# Patient Record
Sex: Female | Born: 1935
Health system: Southern US, Community
[De-identification: ages and names within clinical notes are randomized; demographics above are authoritative.]

## PROBLEM LIST (undated history)

## (undated) ENCOUNTER — Emergency Department (HOSPITAL_COMMUNITY): Disposition: A | Discharge status: Home / Self Care | Payer: PRIVATE HEALTH INSURANCE

## (undated) DIAGNOSIS — I5032 Chronic diastolic (congestive) heart failure: Secondary | ICD-10-CM

## (undated) DIAGNOSIS — N12 Tubulo-interstitial nephritis, not specified as acute or chronic: Secondary | ICD-10-CM

## (undated) DIAGNOSIS — M1990 Unspecified osteoarthritis, unspecified site: Secondary | ICD-10-CM

## (undated) DIAGNOSIS — I480 Paroxysmal atrial fibrillation: Secondary | ICD-10-CM

## (undated) DIAGNOSIS — F329 Major depressive disorder, single episode, unspecified: Secondary | ICD-10-CM

## (undated) DIAGNOSIS — F0390 Unspecified dementia without behavioral disturbance: Secondary | ICD-10-CM

## (undated) DIAGNOSIS — Z853 Personal history of malignant neoplasm of breast: Secondary | ICD-10-CM

## (undated) DIAGNOSIS — I639 Cerebral infarction, unspecified: Secondary | ICD-10-CM

## (undated) DIAGNOSIS — I82409 Acute embolism and thrombosis of unspecified deep veins of unspecified lower extremity: Secondary | ICD-10-CM

## (undated) DIAGNOSIS — E785 Hyperlipidemia, unspecified: Secondary | ICD-10-CM

## (undated) DIAGNOSIS — J449 Chronic obstructive pulmonary disease, unspecified: Secondary | ICD-10-CM

## (undated) DIAGNOSIS — I10 Essential (primary) hypertension: Secondary | ICD-10-CM

## (undated) DIAGNOSIS — M1710 Unilateral primary osteoarthritis, unspecified knee: Secondary | ICD-10-CM

## (undated) DIAGNOSIS — N183 Chronic kidney disease, stage 3 (moderate): Secondary | ICD-10-CM

## (undated) DIAGNOSIS — I471 Supraventricular tachycardia, unspecified: Secondary | ICD-10-CM

## (undated) DIAGNOSIS — D638 Anemia in other chronic diseases classified elsewhere: Secondary | ICD-10-CM

## (undated) DIAGNOSIS — F0151 Vascular dementia with behavioral disturbance: Secondary | ICD-10-CM

## (undated) DIAGNOSIS — F419 Anxiety disorder, unspecified: Secondary | ICD-10-CM

## (undated) HISTORY — DX: Acute embolism and thrombosis of unspecified deep veins of unspecified lower extremity: I82.409

## (undated) HISTORY — DX: Cerebral infarction, unspecified: I63.9

## (undated) HISTORY — DX: Chronic diastolic (congestive) heart failure: I50.32

## (undated) HISTORY — DX: Anxiety disorder, unspecified: F41.9

## (undated) HISTORY — DX: Tubulo-interstitial nephritis, not specified as acute or chronic: N12

## (undated) HISTORY — DX: Chronic kidney disease, stage 3 (moderate): N18.3

## (undated) HISTORY — DX: Essential (primary) hypertension: I10

## (undated) HISTORY — DX: Unspecified osteoarthritis, unspecified site: M19.90

## (undated) HISTORY — DX: Anemia in other chronic diseases classified elsewhere: D63.8

## (undated) HISTORY — DX: Vascular dementia with behavioral disturbance: F01.51

## (undated) HISTORY — DX: Personal history of malignant neoplasm of breast: Z85.3

## (undated) HISTORY — DX: Supraventricular tachycardia: I47.1

## (undated) HISTORY — DX: Major depressive disorder, single episode, unspecified: F32.9

## (undated) HISTORY — PX: EYE SURGERY: SHX253

## (undated) HISTORY — DX: Hyperlipidemia, unspecified: E78.5

## (undated) HISTORY — DX: Paroxysmal atrial fibrillation: I48.0

## (undated) HISTORY — DX: Supraventricular tachycardia, unspecified: I47.10

## (undated) HISTORY — DX: Unilateral primary osteoarthritis, unspecified knee: M17.10

---

## 2007-07-01 DIAGNOSIS — Z853 Personal history of malignant neoplasm of breast: Secondary | ICD-10-CM

## 2007-07-01 HISTORY — DX: Personal history of malignant neoplasm of breast: Z85.3

## 2007-07-01 HISTORY — PX: BREAST LUMPECTOMY: SHX2

## 2008-01-20 ENCOUNTER — Encounter: Admission: RE | Admit: 2008-01-20 | Discharge: 2008-01-20 | Payer: Self-pay | Admitting: Family Medicine

## 2008-02-01 ENCOUNTER — Encounter: Admission: RE | Admit: 2008-02-01 | Discharge: 2008-02-01 | Payer: Self-pay | Admitting: Family Medicine

## 2008-02-01 ENCOUNTER — Encounter (INDEPENDENT_AMBULATORY_CARE_PROVIDER_SITE_OTHER): Payer: Self-pay | Admitting: Diagnostic Radiology

## 2008-02-09 ENCOUNTER — Ambulatory Visit: Payer: Self-pay | Admitting: Oncology

## 2008-02-09 ENCOUNTER — Encounter: Admission: RE | Admit: 2008-02-09 | Discharge: 2008-02-09 | Payer: Self-pay | Admitting: Family Medicine

## 2008-02-15 ENCOUNTER — Ambulatory Visit: Admission: RE | Admit: 2008-02-15 | Discharge: 2008-03-14 | Payer: Self-pay | Admitting: Radiation Oncology

## 2008-03-27 ENCOUNTER — Encounter (INDEPENDENT_AMBULATORY_CARE_PROVIDER_SITE_OTHER): Payer: Self-pay | Admitting: Surgery

## 2008-03-27 ENCOUNTER — Encounter: Admission: RE | Admit: 2008-03-27 | Discharge: 2008-03-27 | Payer: Self-pay | Admitting: Surgery

## 2008-03-27 ENCOUNTER — Ambulatory Visit (HOSPITAL_COMMUNITY): Admission: RE | Admit: 2008-03-27 | Discharge: 2008-03-27 | Payer: Self-pay | Admitting: Surgery

## 2008-03-27 ENCOUNTER — Ambulatory Visit: Admission: RE | Admit: 2008-03-27 | Discharge: 2008-04-17 | Payer: Self-pay | Admitting: Radiation Oncology

## 2008-05-05 ENCOUNTER — Ambulatory Visit: Payer: Self-pay | Admitting: Oncology

## 2008-05-09 LAB — CBC WITH DIFFERENTIAL/PLATELET
BASO%: 0.3 % (ref 0.0–2.0)
EOS%: 2.4 % (ref 0.0–7.0)
MCH: 29.9 pg (ref 26.0–34.0)
MCHC: 33.8 g/dL (ref 32.0–36.0)
MONO%: 5.4 % (ref 0.0–13.0)
NEUT%: 75.2 % (ref 39.6–76.8)
RDW: 14.7 % — ABNORMAL HIGH (ref 11.3–14.5)
lymph#: 1.1 10*3/uL (ref 0.9–3.3)

## 2008-05-10 LAB — COMPREHENSIVE METABOLIC PANEL
Albumin: 3.8 g/dL (ref 3.5–5.2)
Alkaline Phosphatase: 51 U/L (ref 39–117)
Chloride: 103 mEq/L (ref 96–112)
Creatinine, Ser: 1.3 mg/dL — ABNORMAL HIGH (ref 0.40–1.20)
Potassium: 4 mEq/L (ref 3.5–5.3)
Total Protein: 7.3 g/dL (ref 6.0–8.3)

## 2008-05-10 LAB — CANCER ANTIGEN 27.29: CA 27.29: 23 U/mL (ref 0–39)

## 2008-05-10 LAB — VITAMIN D 25 HYDROXY (VIT D DEFICIENCY, FRACTURES): Vit D, 25-Hydroxy: 28 ng/mL — ABNORMAL LOW (ref 30–89)

## 2008-11-02 ENCOUNTER — Ambulatory Visit: Payer: Self-pay | Admitting: Oncology

## 2008-11-06 LAB — COMPREHENSIVE METABOLIC PANEL
ALT: 9 U/L (ref 0–35)
AST: 16 U/L (ref 0–37)
Albumin: 3.6 g/dL (ref 3.5–5.2)
Calcium: 8.8 mg/dL (ref 8.4–10.5)
Chloride: 103 mEq/L (ref 96–112)
Potassium: 3.7 mEq/L (ref 3.5–5.3)
Sodium: 139 mEq/L (ref 135–145)

## 2008-11-06 LAB — CBC WITH DIFFERENTIAL/PLATELET
BASO%: 0.3 % (ref 0.0–2.0)
EOS%: 2.4 % (ref 0.0–7.0)
HGB: 11.6 g/dL (ref 11.6–15.9)
MCH: 29.6 pg (ref 25.1–34.0)
MCHC: 33.9 g/dL (ref 31.5–36.0)
RDW: 15 % — ABNORMAL HIGH (ref 11.2–14.5)
lymph#: 1.3 10*3/uL (ref 0.9–3.3)

## 2009-02-06 ENCOUNTER — Encounter: Admission: RE | Admit: 2009-02-06 | Discharge: 2009-02-06 | Payer: Self-pay | Admitting: Oncology

## 2009-11-05 ENCOUNTER — Ambulatory Visit: Payer: Self-pay | Admitting: Oncology

## 2009-12-13 ENCOUNTER — Ambulatory Visit: Payer: Self-pay | Admitting: Oncology

## 2009-12-17 LAB — CBC WITH DIFFERENTIAL/PLATELET
BASO%: 0.3 % (ref 0.0–2.0)
Basophils Absolute: 0 10*3/uL (ref 0.0–0.1)
EOS%: 1.8 % (ref 0.0–7.0)
Eosinophils Absolute: 0.1 10*3/uL (ref 0.0–0.5)
HCT: 33.9 % — ABNORMAL LOW (ref 34.8–46.6)
HGB: 11.5 g/dL — ABNORMAL LOW (ref 11.6–15.9)
LYMPH%: 19.9 % (ref 14.0–49.7)
MCH: 30.2 pg (ref 25.1–34.0)
MCHC: 33.9 g/dL (ref 31.5–36.0)
MCV: 88.9 fL (ref 79.5–101.0)
MONO#: 0.3 10*3/uL (ref 0.1–0.9)
MONO%: 4.6 % (ref 0.0–14.0)
NEUT#: 5.4 10*3/uL (ref 1.5–6.5)
NEUT%: 73.4 % (ref 38.4–76.8)
Platelets: 261 10*3/uL (ref 145–400)
RBC: 3.81 10*6/uL (ref 3.70–5.45)
RDW: 14.9 % — ABNORMAL HIGH (ref 11.2–14.5)
WBC: 7.3 10*3/uL (ref 3.9–10.3)
lymph#: 1.5 10*3/uL (ref 0.9–3.3)

## 2009-12-17 LAB — COMPREHENSIVE METABOLIC PANEL
ALT: 11 U/L (ref 0–35)
AST: 17 U/L (ref 0–37)
Albumin: 3.5 g/dL (ref 3.5–5.2)
Alkaline Phosphatase: 43 U/L (ref 39–117)
BUN: 15 mg/dL (ref 6–23)
CO2: 26 mEq/L (ref 19–32)
Calcium: 9.3 mg/dL (ref 8.4–10.5)
Chloride: 105 mEq/L (ref 96–112)
Creatinine, Ser: 1.4 mg/dL — ABNORMAL HIGH (ref 0.40–1.20)
Glucose, Bld: 115 mg/dL — ABNORMAL HIGH (ref 70–99)
Potassium: 3.6 mEq/L (ref 3.5–5.3)
Sodium: 138 mEq/L (ref 135–145)
Total Bilirubin: 0.7 mg/dL (ref 0.3–1.2)
Total Protein: 7.1 g/dL (ref 6.0–8.3)

## 2010-02-12 ENCOUNTER — Encounter: Admission: RE | Admit: 2010-02-12 | Discharge: 2010-02-12 | Payer: Self-pay | Admitting: Family Medicine

## 2010-05-12 ENCOUNTER — Encounter (INDEPENDENT_AMBULATORY_CARE_PROVIDER_SITE_OTHER): Payer: Self-pay | Admitting: Internal Medicine

## 2010-05-12 ENCOUNTER — Inpatient Hospital Stay (HOSPITAL_COMMUNITY): Admission: EM | Admit: 2010-05-12 | Discharge: 2010-05-14 | Payer: Self-pay | Admitting: Emergency Medicine

## 2010-05-12 ENCOUNTER — Ambulatory Visit: Payer: Self-pay | Admitting: Cardiovascular Disease

## 2010-07-10 ENCOUNTER — Inpatient Hospital Stay (HOSPITAL_COMMUNITY)
Admission: EM | Admit: 2010-07-10 | Discharge: 2010-07-14 | Payer: Self-pay | Source: Home / Self Care | Attending: Internal Medicine | Admitting: Internal Medicine

## 2010-07-11 ENCOUNTER — Encounter (INDEPENDENT_AMBULATORY_CARE_PROVIDER_SITE_OTHER): Payer: Self-pay | Admitting: Internal Medicine

## 2010-07-15 LAB — BASIC METABOLIC PANEL
BUN: 10 mg/dL (ref 6–23)
BUN: 13 mg/dL (ref 6–23)
BUN: 13 mg/dL (ref 6–23)
BUN: 8 mg/dL (ref 6–23)
CO2: 20 mEq/L (ref 19–32)
CO2: 23 mEq/L (ref 19–32)
CO2: 24 mEq/L (ref 19–32)
CO2: 26 mEq/L (ref 19–32)
Calcium: 6.3 mg/dL — CL (ref 8.4–10.5)
Calcium: 8.3 mg/dL — ABNORMAL LOW (ref 8.4–10.5)
Calcium: 8.6 mg/dL (ref 8.4–10.5)
Calcium: 8.5 mg/dL (ref 8.4–10.5)
Chloride: 105 mEq/L (ref 96–112)
Chloride: 99 mEq/L (ref 96–112)
Chloride: 105 mEq/L (ref 96–112)
Chloride: 105 mEq/L (ref 96–112)
Creatinine, Ser: 0.87 mg/dL (ref 0.4–1.2)
Creatinine, Ser: 1.03 mg/dL (ref 0.4–1.2)
Creatinine, Ser: 1.08 mg/dL (ref 0.4–1.2)
Creatinine, Ser: 1.08 mg/dL (ref 0.4–1.2)
GFR calc Af Amer: >60 mL/min (ref >60)
GFR calc Af Amer: >60 mL/min (ref >60)
GFR calc Af Amer: 59 mL/min — ABNORMAL LOW (ref >60)
GFR calc Af Amer: 59 mL/min — ABNORMAL LOW (ref >60)
GFR calc non Af Amer: >60 mL/min (ref >60)
GFR calc non Af Amer: 51 mL/min — ABNORMAL LOW (ref >60)
GFR calc non Af Amer: 48 mL/min — ABNORMAL LOW (ref >60)
GFR calc non Af Amer: 48 mL/min — ABNORMAL LOW (ref >60)
Glucose, Bld: 139 mg/dL — ABNORMAL HIGH (ref 70–99)
Glucose, Bld: 121 mg/dL — ABNORMAL HIGH (ref 70–99)
Glucose, Bld: 93 mg/dL (ref 70–99)
Glucose, Bld: 93 mg/dL (ref 70–99)
Potassium: 2.4 mEq/L — CL (ref 3.5–5.1)
Potassium: 4.1 mEq/L (ref 3.5–5.1)
Potassium: 4.4 mEq/L (ref 3.5–5.1)
Potassium: 4 mEq/L (ref 3.5–5.1)
Sodium: 132 mEq/L — ABNORMAL LOW (ref 135–145)
Sodium: 130 mEq/L — ABNORMAL LOW (ref 135–145)
Sodium: 134 mEq/L — ABNORMAL LOW (ref 135–145)
Sodium: 138 mEq/L (ref 135–145)

## 2010-07-15 LAB — COMPREHENSIVE METABOLIC PANEL
ALT: <8 U/L (ref 0–35)
ALT: 13 U/L (ref 0–35)
AST: 15 U/L (ref 0–37)
AST: 21 U/L (ref 0–37)
Albumin: 2.7 g/dL — ABNORMAL LOW (ref 3.5–5.2)
Albumin: 2.2 g/dL — ABNORMAL LOW (ref 3.5–5.2)
Alkaline Phosphatase: 52 U/L (ref 39–117)
Alkaline Phosphatase: 58 U/L (ref 39–117)
BUN: 11 mg/dL (ref 6–23)
BUN: 8 mg/dL (ref 6–23)
CO2: 25 mEq/L (ref 19–32)
CO2: 25 mEq/L (ref 19–32)
Calcium: 8.4 mg/dL (ref 8.4–10.5)
Calcium: 8.6 mg/dL (ref 8.4–10.5)
Chloride: 95 mEq/L — ABNORMAL LOW (ref 96–112)
Chloride: 102 mEq/L (ref 96–112)
Creatinine, Ser: 1.01 mg/dL (ref 0.4–1.2)
Creatinine, Ser: 1.16 mg/dL (ref 0.4–1.2)
GFR calc Af Amer: >60 mL/min (ref >60)
GFR calc Af Amer: 54 mL/min — ABNORMAL LOW (ref >60)
GFR calc non Af Amer: 52 mL/min — ABNORMAL LOW (ref >60)
GFR calc non Af Amer: 45 mL/min — ABNORMAL LOW (ref >60)
Glucose, Bld: 144 mg/dL — ABNORMAL HIGH (ref 70–99)
Glucose, Bld: 92 mg/dL (ref 70–99)
Potassium: 3.5 mEq/L (ref 3.5–5.1)
Potassium: 4.1 mEq/L (ref 3.5–5.1)
Sodium: 129 mEq/L — ABNORMAL LOW (ref 135–145)
Sodium: 136 mEq/L (ref 135–145)
Total Bilirubin: 0.7 mg/dL (ref 0.3–1.2)
Total Bilirubin: 0.6 mg/dL (ref 0.3–1.2)
Total Protein: 6.5 g/dL (ref 6.0–8.3)
Total Protein: 5.3 g/dL — ABNORMAL LOW (ref 6.0–8.3)

## 2010-07-15 LAB — CBC
HCT: 30.8 % — ABNORMAL LOW (ref 36.0–46.0)
HCT: 29.1 % — ABNORMAL LOW (ref 36.0–46.0)
HCT: 27.5 % — ABNORMAL LOW (ref 36.0–46.0)
HCT: 27.4 % — ABNORMAL LOW (ref 36.0–46.0)
HCT: 28 % — ABNORMAL LOW (ref 36.0–46.0)
Hemoglobin: 10.3 g/dL — ABNORMAL LOW (ref 12.0–15.0)
Hemoglobin: 9.5 g/dL — ABNORMAL LOW (ref 12.0–15.0)
Hemoglobin: 9.1 g/dL — ABNORMAL LOW (ref 12.0–15.0)
Hemoglobin: 9 g/dL — ABNORMAL LOW (ref 12.0–15.0)
Hemoglobin: 9 g/dL — ABNORMAL LOW (ref 12.0–15.0)
MCH: 28.9 pg (ref 26.0–34.0)
MCH: 28.2 pg (ref 26.0–34.0)
MCH: 28.7 pg (ref 26.0–34.0)
MCH: 28.6 pg (ref 26.0–34.0)
MCH: 28.1 pg (ref 26.0–34.0)
MCHC: 33.4 g/dL (ref 30.0–36.0)
MCHC: 32.6 g/dL (ref 30.0–36.0)
MCHC: 33.1 g/dL (ref 30.0–36.0)
MCHC: 32.8 g/dL (ref 30.0–36.0)
MCHC: 32.1 g/dL (ref 30.0–36.0)
MCV: 86.3 fL (ref 78.0–100.0)
MCV: 86.4 fL (ref 78.0–100.0)
MCV: 86.8 fL (ref 78.0–100.0)
MCV: 87 fL (ref 78.0–100.0)
MCV: 87.5 fL (ref 78.0–100.0)
Platelets: 363 10*3/uL (ref 150–400)
Platelets: 348 10*3/uL (ref 150–400)
Platelets: 346 10*3/uL (ref 150–400)
Platelets: 360 10*3/uL (ref 150–400)
Platelets: 328 10*3/uL (ref 150–400)
RBC: 3.57 MIL/uL — ABNORMAL LOW (ref 3.87–5.11)
RBC: 3.37 MIL/uL — ABNORMAL LOW (ref 3.87–5.11)
RBC: 3.17 MIL/uL — ABNORMAL LOW (ref 3.87–5.11)
RBC: 3.15 MIL/uL — ABNORMAL LOW (ref 3.87–5.11)
RBC: 3.2 MIL/uL — ABNORMAL LOW (ref 3.87–5.11)
RDW: 15.1 % (ref 11.5–15.5)
RDW: 15.1 % (ref 11.5–15.5)
RDW: 15.3 % (ref 11.5–15.5)
RDW: 15.4 % (ref 11.5–15.5)
RDW: 15.4 % (ref 11.5–15.5)
WBC: 13.4 10*3/uL — ABNORMAL HIGH (ref 4.0–10.5)
WBC: 12 10*3/uL — ABNORMAL HIGH (ref 4.0–10.5)
WBC: 8.9 10*3/uL (ref 4.0–10.5)
WBC: 8.4 10*3/uL (ref 4.0–10.5)
WBC: 7.4 10*3/uL (ref 4.0–10.5)

## 2010-07-15 LAB — URINE MICROSCOPIC-ADD ON

## 2010-07-15 LAB — MAGNESIUM
Magnesium: 1.2 mg/dL — ABNORMAL LOW (ref 1.5–2.5)
Magnesium: 2.3 mg/dL (ref 1.5–2.5)
Magnesium: 2.1 mg/dL (ref 1.5–2.5)

## 2010-07-15 LAB — ALBUMIN: Albumin: 2.4 g/dL — ABNORMAL LOW (ref 3.5–5.2)

## 2010-07-15 LAB — FERRITIN: Ferritin: 361 ng/mL — ABNORMAL HIGH (ref 10–291)

## 2010-07-15 LAB — DIFFERENTIAL
Basophils Absolute: 0 10*3/uL (ref 0.0–0.1)
Basophils Relative: 0 % (ref 0–1)
Eosinophils Absolute: 0 10*3/uL (ref 0.0–0.7)
Eosinophils Relative: 0 % (ref 0–5)
Lymphocytes Relative: 9 % — ABNORMAL LOW (ref 12–46)
Lymphs Abs: 1.1 10*3/uL (ref 0.7–4.0)
Monocytes Absolute: 1.1 10*3/uL — ABNORMAL HIGH (ref 0.1–1.0)
Monocytes Relative: 8 % (ref 3–12)
Neutro Abs: 11.1 10*3/uL — ABNORMAL HIGH (ref 1.7–7.7)
Neutrophils Relative %: 83 % — ABNORMAL HIGH (ref 43–77)

## 2010-07-15 LAB — URINE CULTURE
Colony Count: NO GROWTH
Culture  Setup Time: 201201120125
Culture: NO GROWTH

## 2010-07-15 LAB — POCT CARDIAC MARKERS
CKMB, poc: <1 ng/mL — ABNORMAL LOW (ref 1.0–8.0)
CKMB, poc: <1 ng/mL — ABNORMAL LOW (ref 1.0–8.0)
Myoglobin, poc: 74.2 ng/mL (ref 12–200)
Myoglobin, poc: 82.7 ng/mL (ref 12–200)
Troponin i, poc: <0.05 ng/mL (ref 0.00–0.09)
Troponin i, poc: <0.05 ng/mL (ref 0.00–0.09)

## 2010-07-15 LAB — URINALYSIS, ROUTINE W REFLEX MICROSCOPIC
Hgb urine dipstick: NEGATIVE
Ketones, ur: NEGATIVE mg/dL
Leukocytes, UA: NEGATIVE
Nitrite: NEGATIVE
Protein, ur: 30 mg/dL — AB
Specific Gravity, Urine: 1.017 (ref 1.005–1.030)
Urine Glucose, Fasting: NEGATIVE mg/dL
Urobilinogen, UA: 1 mg/dL (ref 0.0–1.0)
pH: 7 (ref 5.0–8.0)

## 2010-07-15 LAB — BRAIN NATRIURETIC PEPTIDE: Pro B Natriuretic peptide (BNP): 154 pg/mL — ABNORMAL HIGH (ref 0.0–100.0)

## 2010-07-15 LAB — IRON AND TIBC
Iron: <10 ug/dL — ABNORMAL LOW (ref 42–135)
UIBC: 116 ug/dL

## 2010-07-15 LAB — MRSA PCR SCREENING: MRSA by PCR: NEGATIVE

## 2010-07-15 LAB — T4, FREE: Free T4: 1.19 ng/dL (ref 0.80–1.80)

## 2010-07-15 LAB — VITAMIN B12: Vitamin B-12: 214 pg/mL (ref 211–911)

## 2010-07-15 LAB — TSH: TSH: 0.739 u[IU]/mL (ref 0.350–4.500)

## 2010-07-15 LAB — FOLATE: Folate: 6 ng/mL

## 2010-07-22 ENCOUNTER — Encounter: Payer: Self-pay | Admitting: Family Medicine

## 2010-08-01 NOTE — Discharge Summary (Signed)
Cindy Trevino, Cindy Trevino             ACCOUNT NO.:  [id number]  MEDICAL RECORD NO.:  [id number]          PATIENT TYPE:  INP  LOCATION:  2007                         FACILITY:  [facility]  PHYSICIAN:  Ladell Pier, M.D.   DATE OF BIRTH:  07-Sep-1924  DATE OF ADMISSION:  07/10/2010 DATE OF DISCHARGE:  07/14/2010                              DISCHARGE SUMMARY   DISCHARGE DIAGNOSES: 1. Right medial lung base pneumonia. 2. Anemia. 3. Paroxysmal atrial fibrillation that is now resolved. 4. Hypertension. 5. Aortic regurgitation. 6. History of breast cancer status post lumpectomy and radiation     therapy in 2009. 7. Chronic left lower extremity edema. 8. Hyponatremia. 9. Hypokalemia.  DISCHARGE MEDICATIONS: 1. Aspirin 81 mg daily. 2. Metoprolol 12.5 mg twice daily. 3. Avelox 400 mg daily for five more days. 4. Benicar 40 mg daily. 5. Norvasc 5 mg daily. 6. Calcium with vitamin D twice daily. 7. Sertraline 25 mg daily. 8. Simvastatin 40 mg at bedtime. 9. Vitamin D3 5000 units daily.  FOLLOWUP APPOINTMENTS:  The patient to follow up with Dr. Maryelizabeth Rowan in 1 week.  PROCEDURES:  Chest x-ray done on July 10, 2010 showed abnormal density in the right medial lung base consistent with pneumonia.  Repeat chest x-ray on July 13, 2009 showed right pleural effusion with mild residual atelectasis at the right lung base.  CONSULTANTS:  Cardiology, [facility].  HISTORY OF PRESENT ILLNESS:  The patient is an 77 year old white female with past medical history significant for hypertension, breast cancer status post left lumpectomy in 2009.  The patient presented to the hospital with 1 week of cough productive of white sputum, associated with weakness and loss of appetite.  She had 3 days of excessive diarrhea more than 10 stools per day, but that cleared up.  She has had no diarrhea since.  Eventually, the patient called her primary care physician who advised her  to go to the emergency room for further evaluation.  Past medical history, family history, social history, meds, allergies, review of systems per admission H and P.  PHYSICAL EXAMINATION:  VITAL SIGNS:  Temperature 97.9, pulse of 76, respirations 18, blood pressure 168/89, pulse ox 92% on room air. GENERAL:  The patient is sitting up in bed, well-nourished white female. HEENT:  Normocephalic and atraumatic.  Pupils reactive to light.  Throat without erythema. CARDIOVASCULAR:  Regular rate and rhythm. LUNGS:  Clear bilaterally.  No wheezes, rhonchi, or rales. ABDOMEN:  Soft, nontender, nondistended.  Positive bowel sounds. EXTREMITIES:  1+ to 2+ edema on the left which is chronic.  HOSPITAL COURSE: 1. Community-acquired pneumonia.  The patient was admitted to the step-     down unit.  She was placed on broad-spectrum antibiotics, Zosyn,     and vancomycin for 3 days and then transitioned to p.o. Avelox     which she is tolerating well.  Her sats were good, 92% on room air.     She will be discharged home to follow up with Dr. Duanne Guess in a week. 2. New-onset atrial fibrillation:  The patient went into AFib with RVR     initially on  presentation that has since resolved.  The patient was     evaluated by [facility].  The     patient now in normal sinus rhythm.  The patient had a 2-D echo     done that showed normal systolic function with EF of 50-55%, some     mild increased wall thickness consistent with LVH.  She had grade 1     diastolic dysfunction on the echo..  She was placed on metoprolol     b.i.d. and aspirin a day, but she is back in normal sinus rhythm so     no further recommendations per Cardiology; most likely, her AFib     was secondary to the stress of her cardiopulmonary pulmonary     process.  She did have a TSH and free T4 done that were normal. 3. Anemia:  The patient was noted to be anemic.  CBC showed a     hemoglobin of 9 at the time  of discharge, it is stable, and can be     worked up for her PCP outpatient as the hemoglobin has been stable. 4. Hypertension:  Blood pressures have been running elevated so her     Benicar was doubled and she was on 10 of Norvasc outpatient that     was decreased to 5 as metoprolol was added to her medication     regimen and further adjustments can be made per her PCP. 5. Decondition:  The patient will be discharged with home PT.  LABS AT THE TIME OF DISCHARGE:  Sodium 138, potassium 4.0, chloride 105, CO2 26, glucose 93, BUN 8, creatinine 1.08, WBC 7.4, hemoglobin 9, MCV 87.5, platelet of 328.  Urine culture was negative.  Free T4 1.19, TSH of 0.739.  Time spent with the patient and grandson and doing this discharge is approximately 45 minutes     Ladell Pier, M.D.     [place]/MEDQ  D:  07/14/2010  T:  07/15/2010  Job:  [id number]  cc:   Maryelizabeth Rowan, M.D.  Electronically Signed by Ladell Pier M.D. on 08/01/2010 01:37:56 PM

## 2010-09-10 LAB — CBC
HCT: 29.9 % — ABNORMAL LOW (ref 36.0–46.0)
HCT: 33.8 % — ABNORMAL LOW (ref 36.0–46.0)
HCT: 35.1 % — ABNORMAL LOW (ref 36.0–46.0)
Hemoglobin: 12 g/dL (ref 12.0–15.0)
MCH: 29.3 pg (ref 26.0–34.0)
MCH: 29.3 pg (ref 26.0–34.0)
MCHC: 33.8 g/dL (ref 30.0–36.0)
MCHC: 34.2 g/dL (ref 30.0–36.0)
MCHC: 34.3 g/dL (ref 30.0–36.0)
MCV: 85.6 fL (ref 78.0–100.0)
MCV: 85.6 fL (ref 78.0–100.0)
MCV: 86.7 fL (ref 78.0–100.0)
Platelets: 239 10*3/uL (ref 150–400)
Platelets: 247 10*3/uL (ref 150–400)
RBC: 4.1 MIL/uL (ref 3.87–5.11)
RDW: 14.3 % (ref 11.5–15.5)
RDW: 14.4 % (ref 11.5–15.5)
RDW: 14.7 % (ref 11.5–15.5)
WBC: 8.7 10*3/uL (ref 4.0–10.5)

## 2010-09-10 LAB — DIFFERENTIAL
Basophils Absolute: 0 10*3/uL (ref 0.0–0.1)
Basophils Relative: 0 % (ref 0–1)
Eosinophils Absolute: 0 10*3/uL (ref 0.0–0.7)
Eosinophils Relative: 0 % (ref 0–5)
Lymphocytes Relative: 17 % (ref 12–46)
Lymphs Abs: 1.4 10*3/uL (ref 0.7–4.0)
Monocytes Absolute: 0.4 10*3/uL (ref 0.1–1.0)
Monocytes Relative: 5 % (ref 3–12)
Neutro Abs: 6.8 10*3/uL (ref 1.7–7.7)
Neutrophils Relative %: 78 % — ABNORMAL HIGH (ref 43–77)

## 2010-09-10 LAB — BASIC METABOLIC PANEL
BUN: 10 mg/dL (ref 6–23)
BUN: 13 mg/dL (ref 6–23)
BUN: 13 mg/dL (ref 6–23)
BUN: 8 mg/dL (ref 6–23)
CO2: 25 mEq/L (ref 19–32)
CO2: 28 mEq/L (ref 19–32)
Calcium: 8.8 mg/dL (ref 8.4–10.5)
Chloride: 88 mEq/L — ABNORMAL LOW (ref 96–112)
Chloride: 89 mEq/L — ABNORMAL LOW (ref 96–112)
Chloride: 98 mEq/L (ref 96–112)
Creatinine, Ser: 1.01 mg/dL (ref 0.4–1.2)
Creatinine, Ser: 1.13 mg/dL (ref 0.4–1.2)
Creatinine, Ser: 1.2 mg/dL (ref 0.4–1.2)
GFR calc Af Amer: 52 mL/min — ABNORMAL LOW (ref >60)
GFR calc Af Amer: 56 mL/min — ABNORMAL LOW (ref >60)
GFR calc non Af Amer: 43 mL/min — ABNORMAL LOW (ref >60)
GFR calc non Af Amer: 46 mL/min — ABNORMAL LOW (ref >60)
GFR calc non Af Amer: 52 mL/min — ABNORMAL LOW (ref >60)
Glucose, Bld: 129 mg/dL — ABNORMAL HIGH (ref 70–99)
Glucose, Bld: 91 mg/dL (ref 70–99)
Glucose, Bld: 93 mg/dL (ref 70–99)
Potassium: 3.3 mEq/L — ABNORMAL LOW (ref 3.5–5.1)
Potassium: 3.4 mEq/L — ABNORMAL LOW (ref 3.5–5.1)
Potassium: 4.4 mEq/L (ref 3.5–5.1)
Potassium: 4.6 mEq/L (ref 3.5–5.1)
Sodium: 126 mEq/L — ABNORMAL LOW (ref 135–145)

## 2010-09-10 LAB — URINE CULTURE
Colony Count: NO GROWTH
Culture  Setup Time: 201111130154
Culture: NO GROWTH

## 2010-09-10 LAB — URINALYSIS, ROUTINE W REFLEX MICROSCOPIC
Bilirubin Urine: NEGATIVE
Glucose, UA: NEGATIVE mg/dL
Hgb urine dipstick: NEGATIVE
Ketones, ur: NEGATIVE mg/dL
Nitrite: NEGATIVE
Protein, ur: NEGATIVE mg/dL
Specific Gravity, Urine: 1.007 (ref 1.005–1.030)
Urobilinogen, UA: 0.2 mg/dL (ref 0.0–1.0)
pH: 6.5 (ref 5.0–8.0)

## 2010-09-10 LAB — CARDIAC PANEL(CRET KIN+CKTOT+MB+TROPI)
CK, MB: 1.5 ng/mL (ref 0.3–4.0)
Relative Index: INVALID (ref 0.0–2.5)
Troponin I: 0.02 ng/mL (ref 0.00–0.06)
Troponin I: 0.03 ng/mL (ref 0.00–0.06)

## 2010-09-10 LAB — CK TOTAL AND CKMB (NOT AT ARMC)
CK, MB: 1.4 ng/mL (ref 0.3–4.0)
Relative Index: INVALID (ref 0.0–2.5)
Total CK: 41 U/L (ref 7–177)

## 2010-09-10 LAB — NA AND K (SODIUM & POTASSIUM), RAND UR: Potassium Urine: 13 mEq/L

## 2010-09-10 LAB — LIPID PANEL: VLDL: 15 mg/dL (ref 0–40)

## 2010-09-10 LAB — OSMOLALITY, URINE: Osmolality, Ur: 222 mOsm/kg — ABNORMAL LOW (ref 390–1090)

## 2010-09-10 LAB — TROPONIN I: Troponin I: 0.03 ng/mL (ref 0.00–0.06)

## 2010-09-10 LAB — TSH: TSH: 1.098 u[IU]/mL (ref 0.350–4.500)

## 2010-11-12 NOTE — Op Note (Signed)
NAMECARLIN, MAMONE             ACCOUNT NO.:  [id number]   MEDICAL RECORD NO.:  [id number]          PATIENT TYPE:  AMB   LOCATION:  SDS                          FACILITY:  [facility]   PHYSICIAN:  Thomas A. Cornett, M.D.DATE OF BIRTH:  [birth year]   DATE OF PROCEDURE:  03/27/2008  DATE OF DISCHARGE:  03/27/2008                               OPERATIVE REPORT   PREOPERATIVE DIAGNOSIS:  A 1.7 cm left breast cancer.   POSTOPERATIVE DIAGNOSIS:  A 1.7 cm left breast cancer, stage I.   PROCEDURE:  1. Left breast needle-localized lumpectomy.  2. Placement of left breast cavitary evaluation device for MammoSite      using ultrasound guidance.   SURGEON:  Maisie Fus A. Cornett, MD   ASSISTANT:  OR staff.   ANESTHESIA:  MAC with 0.25% Sensorcaine local.   ESTIMATED BLOOD LOSS:  20 mL.   SPECIMEN:  Left breast tissue with associated margins to pathology.  Radiograph revealed specimen to be adequate.   DESCRIPTION OF PROCEDURE:  The patient is an 77 year old female who was  found to have a 1.7-cm left breast ER, PR-positive, HER2 neu negative.  Breast mass core biopsy proven to be invasive mammary carcinoma.  She  was sent for Medical, Radiation Oncology opinions.  They felt that  lumpectomy with associated MammoSite and subsequent hormone therapy  would be adequate treatment for her.  She presents today for MammoSite  after lumpectomy.   DESCRIPTION OF PROCEDURE:  The patient was brought to the operating room  after undergoing left breast needle localization.  Left breast was  prepped and draped in sterile fashion.  A 0.25% Sensorcaine was used for  anesthesia as well as MAC by Anesthesia.  Wire was appropriately  trimmed, and left breast was prepped and draped in sterile fashion.  After injection of local anesthesia, a curvilinear incision was made on  the wire.  Wire was pulled out, and I dissected down to the mass and  excised in its entirety.  I also took additional margins except the  deep  margin which overlapped the pectoralis major muscle.  Hemostasis was  achieved.  Specimen sent for radiography and was found to be adequate.  I then placed through a separate stab wound laterally a cavitary  evaluation device for MammoSite.  I inflated the balloon to about 40 mL  of fluid in the balloon itself and held that well.  This encompassed  most of the cavity.  I then took the fluid out and I closed the cavity  down the deep layer of 3-0 Vicryl and then 4-0 Monocryl.  Ultrasound was  then done in the operating room.  At this point, my margin from balloon  to skin was only about 6 mm.  Then I removed the fluid from the balloon,  opened the wound back up, and then closed the deeper layer such that I  could achieve at least 7 mm from skin to balloon.  I did this using 3-0  Vicryl and subsequent 4-0 Monocryl.  We then repeated the ultrasound and  found that her margins from the balloon to the skin was  about 9 mm  circumferentially around the balloon.  This was a much better skin to  balloon ratio.  We had about 40 mL of fluid in the balloon itself which  was mixed 10 mL diluted by 120 mL of saline.  A 10 mL was contrast  dye.  We then secured the catheter to the skin.  Dermabond was applied  to the skin incision.  Antibiotic was placed around the catheter.  All  final counts of sponge, needle, and instruments were found to be  correct.  The patient was awoken and taken to recovery in satisfactory  condition.      Thomas A. Cornett, M.D.  Electronically Signed     TAC/MEDQ  D:  03/27/2008  T:  03/28/2008  Job:  [id number]   cc:   Maryelizabeth Rowan, M.D.  Artist Pais Kathrynn Running, M.D.  Valentino Hue. Magrinat, M.D.

## 2010-12-18 ENCOUNTER — Other Ambulatory Visit: Payer: Self-pay | Admitting: Oncology

## 2010-12-18 ENCOUNTER — Encounter (HOSPITAL_BASED_OUTPATIENT_CLINIC_OR_DEPARTMENT_OTHER): Payer: PRIVATE HEALTH INSURANCE | Admitting: Oncology

## 2010-12-18 DIAGNOSIS — C50419 Malignant neoplasm of upper-outer quadrant of unspecified female breast: Secondary | ICD-10-CM

## 2010-12-18 DIAGNOSIS — Z170 Estrogen receptor positive status [ER+]: Secondary | ICD-10-CM

## 2010-12-18 LAB — CBC WITH DIFFERENTIAL/PLATELET
BASO%: 0.3 % (ref 0.0–2.0)
Basophils Absolute: 0 10*3/uL (ref 0.0–0.1)
EOS%: 1.2 % (ref 0.0–7.0)
Eosinophils Absolute: 0.1 10*3/uL (ref 0.0–0.5)
HCT: 35.4 % (ref 34.8–46.6)
HGB: 11.8 g/dL (ref 11.6–15.9)
LYMPH%: 23 % (ref 14.0–49.7)
MCH: 28.1 pg (ref 25.1–34.0)
MCHC: 33.3 g/dL (ref 31.5–36.0)
MCV: 84.5 fL (ref 79.5–101.0)
MONO#: 0.4 10*3/uL (ref 0.1–0.9)
MONO%: 5.3 % (ref 0.0–14.0)
NEUT#: 5.3 10*3/uL (ref 1.5–6.5)
NEUT%: 70.2 % (ref 38.4–76.8)
Platelets: 223 10*3/uL (ref 145–400)
RBC: 4.19 10*6/uL (ref 3.70–5.45)
RDW: 17.9 % — ABNORMAL HIGH (ref 11.2–14.5)
WBC: 7.6 10*3/uL (ref 3.9–10.3)
lymph#: 1.7 10*3/uL (ref 0.9–3.3)

## 2010-12-19 ENCOUNTER — Other Ambulatory Visit: Payer: Self-pay | Admitting: Oncology

## 2010-12-19 DIAGNOSIS — Z853 Personal history of malignant neoplasm of breast: Secondary | ICD-10-CM

## 2011-02-14 ENCOUNTER — Ambulatory Visit
Admission: RE | Admit: 2011-02-14 | Discharge: 2011-02-14 | Disposition: A | Discharge status: Home / Self Care | Payer: Medicare Other | Source: Ambulatory Visit | Attending: Oncology | Admitting: Oncology

## 2011-02-14 DIAGNOSIS — Z853 Personal history of malignant neoplasm of breast: Secondary | ICD-10-CM

## 2011-02-26 ENCOUNTER — Emergency Department (HOSPITAL_COMMUNITY)
Admission: EM | Admit: 2011-02-26 | Discharge: 2011-02-27 | Disposition: A | Discharge status: Home / Self Care | Payer: PRIVATE HEALTH INSURANCE | Attending: General Surgery | Admitting: General Surgery

## 2011-02-26 ENCOUNTER — Emergency Department (HOSPITAL_COMMUNITY): Payer: PRIVATE HEALTH INSURANCE

## 2011-02-26 DIAGNOSIS — Y92009 Unspecified place in unspecified non-institutional (private) residence as the place of occurrence of the external cause: Secondary | ICD-10-CM | POA: Insufficient documentation

## 2011-02-26 DIAGNOSIS — F29 Unspecified psychosis not due to a substance or known physiological condition: Secondary | ICD-10-CM | POA: Insufficient documentation

## 2011-02-26 DIAGNOSIS — W19XXXA Unspecified fall, initial encounter: Secondary | ICD-10-CM | POA: Insufficient documentation

## 2011-02-26 DIAGNOSIS — S0100XA Unspecified open wound of scalp, initial encounter: Secondary | ICD-10-CM | POA: Insufficient documentation

## 2011-02-26 DIAGNOSIS — J449 Chronic obstructive pulmonary disease, unspecified: Secondary | ICD-10-CM | POA: Insufficient documentation

## 2011-02-26 DIAGNOSIS — S060X0A Concussion without loss of consciousness, initial encounter: Secondary | ICD-10-CM | POA: Insufficient documentation

## 2011-02-26 DIAGNOSIS — Z79899 Other long term (current) drug therapy: Secondary | ICD-10-CM | POA: Insufficient documentation

## 2011-02-26 DIAGNOSIS — J4489 Other specified chronic obstructive pulmonary disease: Secondary | ICD-10-CM | POA: Insufficient documentation

## 2011-02-26 DIAGNOSIS — I10 Essential (primary) hypertension: Secondary | ICD-10-CM | POA: Insufficient documentation

## 2011-02-26 HISTORY — DX: Essential (primary) hypertension: I10

## 2011-02-26 HISTORY — DX: Chronic obstructive pulmonary disease, unspecified: J44.9

## 2011-02-27 ENCOUNTER — Encounter (HOSPITAL_COMMUNITY): Payer: Self-pay | Admitting: Radiology

## 2011-03-31 LAB — CBC
HCT: 36.9
MCHC: 32.9
MCV: 89.7
Platelets: 283
WBC: 7.5

## 2011-03-31 LAB — BASIC METABOLIC PANEL
BUN: 12
CO2: 29
Chloride: 103
Creatinine, Ser: 1.32 — ABNORMAL HIGH
Glucose, Bld: 120 — ABNORMAL HIGH
Potassium: 4.1

## 2011-03-31 LAB — DIFFERENTIAL
Basophils Relative: 1
Eosinophils Absolute: 0.1
Eosinophils Relative: 2
Lymphs Abs: 1.9
Neutrophils Relative %: 67

## 2011-05-15 ENCOUNTER — Encounter (INDEPENDENT_AMBULATORY_CARE_PROVIDER_SITE_OTHER): Payer: Self-pay | Admitting: Surgery

## 2011-05-31 ENCOUNTER — Telehealth: Payer: Self-pay | Admitting: Oncology

## 2011-05-31 NOTE — Telephone Encounter (Signed)
called pts daughter to schedule appt for aug2013 and she asked that I mail appts to her home

## 2011-08-21 ENCOUNTER — Ambulatory Visit (INDEPENDENT_AMBULATORY_CARE_PROVIDER_SITE_OTHER): Payer: Medicaid Other | Admitting: Surgery

## 2011-08-21 ENCOUNTER — Encounter (INDEPENDENT_AMBULATORY_CARE_PROVIDER_SITE_OTHER): Payer: Self-pay | Admitting: Surgery

## 2011-08-21 VITALS — BP 142/98 | HR 96 | Temp 97.6°F | Resp 26 | Ht 61.0 in | Wt 175.6 lb

## 2011-08-21 DIAGNOSIS — Z853 Personal history of malignant neoplasm of breast: Secondary | ICD-10-CM

## 2011-08-21 NOTE — Patient Instructions (Signed)
Follow up 1 year 

## 2011-08-21 NOTE — Progress Notes (Signed)
NAME: ALIZZON DIOGUARDI       DOB: 1924/08/02           DATE: 08/21/2011       MRN: [id number]   KAJAH SANTIZO is a 78 y.o.Marland Kitchenfemale who presents for routine followup of her Right breast  Cancer stage 1diagnosed in 2009 and treated with breast conservation and mammosite.  She has no problems or concerns on either side.  PFSH: She has had no significant changes since the last visit here.  ROS: There have been no significant changes since the last visit here  EXAM: General: The patient is alert, oriented, generally healty appearing, NAD. Mood and affect are normal.  Breasts:  post surgical scaring on right.  No mass bilaterally. Lymphatics: She has no axillary or supraclavicular adenopathy on either side.  Extremities: Full ROM of the surgical side with no lymphedema noted.  Data Reviewed: Mammogram august 2012 BIRADS 2  Impression: Doing well, with no evidence of recurrent cancer or new cancer  Plan: Will continue to follow up on an annual basis here.

## 2012-02-02 DIAGNOSIS — F0390 Unspecified dementia without behavioral disturbance: Secondary | ICD-10-CM

## 2012-02-02 HISTORY — DX: Unspecified dementia, unspecified severity, without behavioral disturbance, psychotic disturbance, mood disturbance, and anxiety: F03.90

## 2012-02-19 ENCOUNTER — Other Ambulatory Visit: Payer: Medicare Other | Admitting: Lab

## 2012-02-19 ENCOUNTER — Ambulatory Visit: Payer: Medicare Other | Admitting: Oncology

## 2012-07-16 ENCOUNTER — Encounter (INDEPENDENT_AMBULATORY_CARE_PROVIDER_SITE_OTHER): Payer: Self-pay | Admitting: Surgery

## 2012-08-23 ENCOUNTER — Encounter (INDEPENDENT_AMBULATORY_CARE_PROVIDER_SITE_OTHER): Payer: Self-pay | Admitting: Surgery

## 2012-08-23 ENCOUNTER — Ambulatory Visit (INDEPENDENT_AMBULATORY_CARE_PROVIDER_SITE_OTHER): Payer: PRIVATE HEALTH INSURANCE | Admitting: Surgery

## 2012-08-23 VITALS — BP 110/74 | HR 63 | Temp 97.4°F | Resp 18 | Ht 62.0 in | Wt 179.0 lb

## 2012-08-23 DIAGNOSIS — Z853 Personal history of malignant neoplasm of breast: Secondary | ICD-10-CM

## 2012-08-23 NOTE — Progress Notes (Signed)
NAME: [name]       DOB: 05/20/1925           DATE: 08/23/2012       MRN: [id number]   Cindy Trevino is a 79 y.o.Marland Kitchenfemale who presents for routine followup of her Right breast  Cancer stage 1diagnosed in 2009 and treated with breast conservation and mammosite.  She has no problems or concerns on either side.  PFSH: She has had no significant changes since the last visit here.  ROS: There have been no significant changes since the last visit here  EXAM: General: The patient is alert, oriented, generally healty appearing, NAD. Mood and affect are normal.  Breasts:  post surgical scaring on right.  No mass bilaterally. Lymphatics: She has no axillary or supraclavicular adenopathy on either side.  Extremities: Full ROM of the surgical side with no lymphedema noted.  Data Reviewed: Mammogram august 2012 BIRADS 2  Impression: Doing well, with no evidence of recurrent cancer or new cancer  Plan: Will continue to follow up on an annual basis here.

## 2012-08-23 NOTE — Patient Instructions (Signed)
Return 1 year. 

## 2013-05-24 ENCOUNTER — Other Ambulatory Visit: Payer: Self-pay | Admitting: Family Medicine

## 2013-05-24 DIAGNOSIS — Z853 Personal history of malignant neoplasm of breast: Secondary | ICD-10-CM

## 2013-06-06 ENCOUNTER — Telehealth: Payer: Self-pay | Admitting: *Deleted

## 2013-06-06 NOTE — Telephone Encounter (Signed)
This RN received a call from pt's dtr, Cindy Trevino, stating they are at Cindy Trevino and " they think her cancer is back, is there anyway Cindy Trevino can see her today ?"  This RN inquired what is Cindy Trevino concerned about that relates to possible cancer recurrence.  Cindy Trevino states " she has a knot the size of a 50 cent piece and she has spells where her eyes roll back in her head ". " but she is almost 64 and is so feeble, even if it is cancer I don't know that we would want to do anything "  This RN reviewed pt's chart per EPIC with no noted dictation from MD- this RN inquired with Cindy Trevino - when pt was last seen in this office. Cindy Trevino stated " oh it's been years". " but she sees Cindy Trevino ."  This RN discussed with dtr, best for above to be worked up appropriately so if it is cancer - Cindy Trevino can discuss aspects of care. Cindy Trevino should complete his work up and if urgent concern can contact Cindy Trevino to discuss obtaining an appointment.  Cindy Trevino asked, " Maybe we should see Cindy Trevino ?". This RN stated since pt was seen more recently by him and possible need for a biopsy of knot - his office may be more appropriate at this time.  Per further chart review post call- pt was last seen in June of 2012 per appointment history.  This note will be given to MD for review.

## 2013-06-09 ENCOUNTER — Ambulatory Visit
Admission: RE | Admit: 2013-06-09 | Discharge: 2013-06-09 | Disposition: A | Discharge status: Home / Self Care | Payer: Self-pay | Source: Ambulatory Visit | Attending: Family Medicine | Admitting: Family Medicine

## 2013-06-09 ENCOUNTER — Other Ambulatory Visit: Payer: Self-pay | Admitting: Family Medicine

## 2013-06-09 DIAGNOSIS — Z853 Personal history of malignant neoplasm of breast: Secondary | ICD-10-CM

## 2013-06-10 ENCOUNTER — Other Ambulatory Visit: Payer: Self-pay | Admitting: Family Medicine

## 2013-06-10 DIAGNOSIS — N6320 Unspecified lump in the left breast, unspecified quadrant: Secondary | ICD-10-CM

## 2013-06-16 ENCOUNTER — Ambulatory Visit
Admission: RE | Admit: 2013-06-16 | Discharge: 2013-06-16 | Disposition: A | Discharge status: Home / Self Care | Payer: Self-pay | Source: Ambulatory Visit | Attending: Family Medicine | Admitting: Family Medicine

## 2013-06-16 ENCOUNTER — Other Ambulatory Visit: Payer: Self-pay | Admitting: Family Medicine

## 2013-06-16 DIAGNOSIS — N6320 Unspecified lump in the left breast, unspecified quadrant: Secondary | ICD-10-CM

## 2013-07-13 ENCOUNTER — Telehealth (INDEPENDENT_AMBULATORY_CARE_PROVIDER_SITE_OTHER): Payer: Self-pay

## 2013-07-13 NOTE — Telephone Encounter (Signed)
Called Cindy Trevino back and told her it is okay to miss the appt but to make sure she stays up to date on her mammograms.

## 2013-07-13 NOTE — Telephone Encounter (Signed)
Vaughan Basta called on behalf of the patient Cindy Trevino) to ask if she need's to bring her in for a yearly follow up.  Patient is 80 years old and has a difficult time with mobility.  Vaughan Basta states patient had MGM @ [facility].  They would like to know if it's possible for patient to miss her yearly appointment due to difficulty with moving around.  Please advise

## 2013-07-13 NOTE — Telephone Encounter (Signed)
That's ok to miss

## 2013-07-25 ENCOUNTER — Ambulatory Visit (INDEPENDENT_AMBULATORY_CARE_PROVIDER_SITE_OTHER): Payer: PRIVATE HEALTH INSURANCE | Admitting: Surgery

## 2013-09-04 ENCOUNTER — Emergency Department (HOSPITAL_COMMUNITY): Payer: Self-pay

## 2013-09-04 ENCOUNTER — Emergency Department (HOSPITAL_COMMUNITY)
Admission: EM | Admit: 2013-09-04 | Discharge: 2013-09-05 | Disposition: A | Discharge status: Home / Self Care | Payer: Self-pay | Attending: Emergency Medicine | Admitting: Emergency Medicine

## 2013-09-04 ENCOUNTER — Encounter (HOSPITAL_COMMUNITY): Payer: Self-pay | Admitting: Emergency Medicine

## 2013-09-04 DIAGNOSIS — F0390 Unspecified dementia without behavioral disturbance: Secondary | ICD-10-CM | POA: Insufficient documentation

## 2013-09-04 DIAGNOSIS — S0083XA Contusion of other part of head, initial encounter: Secondary | ICD-10-CM

## 2013-09-04 DIAGNOSIS — S0003XA Contusion of scalp, initial encounter: Secondary | ICD-10-CM | POA: Insufficient documentation

## 2013-09-04 DIAGNOSIS — S0990XA Unspecified injury of head, initial encounter: Secondary | ICD-10-CM | POA: Insufficient documentation

## 2013-09-04 DIAGNOSIS — I10 Essential (primary) hypertension: Secondary | ICD-10-CM | POA: Insufficient documentation

## 2013-09-04 DIAGNOSIS — E860 Dehydration: Secondary | ICD-10-CM | POA: Insufficient documentation

## 2013-09-04 DIAGNOSIS — S1093XA Contusion of unspecified part of neck, initial encounter: Secondary | ICD-10-CM

## 2013-09-04 DIAGNOSIS — W19XXXA Unspecified fall, initial encounter: Secondary | ICD-10-CM | POA: Insufficient documentation

## 2013-09-04 DIAGNOSIS — Y92009 Unspecified place in unspecified non-institutional (private) residence as the place of occurrence of the external cause: Secondary | ICD-10-CM | POA: Insufficient documentation

## 2013-09-04 DIAGNOSIS — Z79899 Other long term (current) drug therapy: Secondary | ICD-10-CM | POA: Insufficient documentation

## 2013-09-04 DIAGNOSIS — R1110 Vomiting, unspecified: Secondary | ICD-10-CM | POA: Insufficient documentation

## 2013-09-04 DIAGNOSIS — Y939 Activity, unspecified: Secondary | ICD-10-CM | POA: Insufficient documentation

## 2013-09-04 HISTORY — DX: Unspecified dementia without behavioral disturbance: F03.90

## 2013-09-04 MED ORDER — SODIUM CHLORIDE 0.9 % IV SOLN
Freq: Once | INTRAVENOUS | Status: AC
  Administered 2013-09-05: 01:00:00 via INTRAVENOUS

## 2013-09-04 NOTE — ED Provider Notes (Signed)
CSN: [id number]     Arrival date & time 09/04/13  2221 History   First MD Initiated Contact with Patient 09/04/13 2312     Chief Complaint  Patient presents with  . Fall  . Emesis     (Consider location/radiation/quality/duration/timing/severity/associated sxs/prior Treatment) HPI Comments: Demented 80 year old female presents after having 2 separate falls at home today. She also had 2 episodes of vomiting prior to arrival and according to family members has appeared slightly more sleepy. She has been spending more time in bed, eating less and less but there has been no fevers, coughing, chest pain, belly pain, rashes, seizures or any other abnormal complaints. These falls were witnessed by family members however those family members are not here at this time.  Patient does not complain of anything at all at this time.  Patient is a 80 y.o. female presenting with fall and vomiting. The history is provided by the patient and a relative.  Fall  Emesis   Past Medical History  Diagnosis Date  . Dementia    History reviewed. No pertinent past surgical history. History reviewed. No pertinent family history. History  Substance Use Topics  . Smoking status: Never Smoker   . Smokeless tobacco: Not on file  . Alcohol Use: Not on file   OB History   Grav Para Term Preterm Abortions TAB SAB Ect Mult Living                 Review of Systems  Unable to perform ROS: Dementia  Gastrointestinal: Positive for vomiting.      Allergies  Review of patient's allergies indicates not on file.  Home Medications   Current Outpatient Rx  Name  Route  Sig  Dispense  Refill  . lisinopril (PRINIVIL,ZESTRIL) 10 MG tablet   Oral   Take 1 tablet (10 mg total) by mouth daily.   30 tablet   1    BP 150/108  Pulse 67  Temp(Src) 97.5 F (36.4 C) (Oral)  Resp 17  SpO2 98% Physical Exam  Nursing note and vitals reviewed. Constitutional: She appears well-developed and well-nourished. No  distress.  HENT:  Head: Normocephalic.  Mouth/Throat: Oropharynx is clear and moist. No oropharyngeal exudate.  Small amount of bruising over the nasal bridge, no septal hematoma, no hemotympanum, no malocclusion, no tenderness over the facial bones or the scalp  Eyes: Conjunctivae and EOM are normal. Pupils are equal, round, and reactive to light. Right eye exhibits no discharge. Left eye exhibits no discharge. No scleral icterus.  Neck: Normal range of motion. Neck supple. No JVD present. No thyromegaly present.  Cardiovascular: Normal rate, regular rhythm, normal heart sounds and intact distal pulses.  Exam reveals no gallop and no friction rub.   No murmur heard. Pulmonary/Chest: Effort normal and breath sounds normal. No respiratory distress. She has no wheezes. She has no rales.  Abdominal: Soft. Bowel sounds are normal. She exhibits no distension and no mass. There is no tenderness.  Palpable mass or pulsating mass, no tenderness, no hepatosplenomegaly  Musculoskeletal: Normal range of motion. She exhibits tenderness (Mild tenderness with range of motion of the right knee ). She exhibits no edema.  All 4 extremities without deformity, and joints are supple, compartments are soft  Lymphadenopathy:    She has no cervical adenopathy.  Neurological: She is alert. Coordination normal.  Straight leg raise equal bilaterally with normal strength, follows commands without difficulty, no ataxia of the trunk or the extremities, speech is clear, memory is  not intact, baseline according to grandson  Skin: Skin is warm and dry. No rash noted. No erythema.  Psychiatric: She has a normal mood and affect. Her behavior is normal.    ED Course  Procedures (including critical care time) Labs Review Labs Reviewed  URINALYSIS, ROUTINE W REFLEX MICROSCOPIC - Abnormal; Notable for the following:    APPearance CLOUDY (*)    Bilirubin Urine SMALL (*)    Protein, ur 30 (*)    Leukocytes, UA TRACE (*)     All other components within normal limits  CBC WITH DIFFERENTIAL - Abnormal; Notable for the following:    RDW 15.8 (*)    Neutrophils Relative % 80 (*)    All other components within normal limits  COMPREHENSIVE METABOLIC PANEL - Abnormal; Notable for the following:    Potassium 3.6 (*)    Glucose, Bld 122 (*)    Creatinine, Ser 1.24 (*)    Albumin 3.4 (*)    GFR calc non Af Amer 38 (*)    GFR calc Af Amer 44 (*)    All other components within normal limits  URINE MICROSCOPIC-ADD ON - Abnormal; Notable for the following:    Squamous Epithelial / LPF FEW (*)    Casts GRANULAR CAST (*)    Crystals CA OXALATE CRYSTALS (*)    All other components within normal limits  TROPONIN I   Imaging Review Ct Head Wo Contrast  09/04/2013   CLINICAL DATA:  Multiple falls, vomiting.  Dementia.  EXAM: CT HEAD WITHOUT CONTRAST  CT CERVICAL SPINE WITHOUT CONTRAST  TECHNIQUE: Multidetector CT imaging of the head and cervical spine was performed following the standard protocol without intravenous contrast. Multiplanar CT image reconstructions of the cervical spine were also generated.  COMPARISON:  None available for comparison at time of study interpretation.  FINDINGS: CT HEAD FINDINGS  The ventricles and sulci are normal for age. No intraparenchymal hemorrhage, mass effect nor midline shift. Confluent supratentorial white matter hypodensities are non-specific suggest sequelae of severe chronic small vessel ischemic disease. No acute large vascular territory infarcts. 9 mm remote left basal ganglia lacunar infarct with additional subcentimeter basal ganglia hypodensities favoring lacunar infarcts.  No abnormal extra-axial fluid collections. Basal cisterns are patent. Moderate calcific atherosclerosis of the carotid siphons and included vertebral arteries.  No skull fracture. Mild paranasal sinus mucosal thickening without air-fluid levels. Mastoid air cells are well aerated. . The included ocular globes and  orbital contents are non-suspicious. Patient is nearly edentulous.  CT CERVICAL SPINE FINDINGS  No acute cervical spine fracture. Grade 1 C4-5 and grade 1 C5-6 anterolisthesis on degenerative basis. Severe C2-3 disc height loss, which may in part reflect segmentation anomaly. Severe C6-7 degenerative disc disease, moderate to severe at C4-5 and C5-6. C1-2 articulation maintained with severe arthropathy calcified apical ligament. Subcentimeter sclerotic focus in the right C6 facet may reflect bone island. Nuchal ligament calcifications. Subcentimeter calcifications seen in the right vallecular, consider direct inspection. Moderate to severe right greater left carotid bifurcation calcific atherosclerosis, Severe sternoclavicular osteoarthrosis.  Degenerative disc disease and facet arthropathy resultant mild to moderate canal stenosis at C6-7. Severe neural foraminal narrowing C3-4 thru C6-7.  IMPRESSION: CT head:  No acute intracranial process.  Severe white matter changes may reflect chronic small vessel ischemic disease. Bilateral basal ganglia and left thalamus lacunar infarcts are likely chronic. Involutional changes.  CT cervical spine: No acute fracture. Grade 1 C4-5 anterolisthesis, grade 1 C5-6 anterolisthesis on degenerative basis.   Electronically Signed  By: Elon Alas   On: 09/04/2013 23:55   Ct Cervical Spine Wo Contrast  09/04/2013   CLINICAL DATA:  Multiple falls, vomiting.  Dementia.  EXAM: CT HEAD WITHOUT CONTRAST  CT CERVICAL SPINE WITHOUT CONTRAST  TECHNIQUE: Multidetector CT imaging of the head and cervical spine was performed following the standard protocol without intravenous contrast. Multiplanar CT image reconstructions of the cervical spine were also generated.  COMPARISON:  None available for comparison at time of study interpretation.  FINDINGS: CT HEAD FINDINGS  The ventricles and sulci are normal for age. No intraparenchymal hemorrhage, mass effect nor midline shift. Confluent  supratentorial white matter hypodensities are non-specific suggest sequelae of severe chronic small vessel ischemic disease. No acute large vascular territory infarcts. 9 mm remote left basal ganglia lacunar infarct with additional subcentimeter basal ganglia hypodensities favoring lacunar infarcts.  No abnormal extra-axial fluid collections. Basal cisterns are patent. Moderate calcific atherosclerosis of the carotid siphons and included vertebral arteries.  No skull fracture. Mild paranasal sinus mucosal thickening without air-fluid levels. Mastoid air cells are well aerated. . The included ocular globes and orbital contents are non-suspicious. Patient is nearly edentulous.  CT CERVICAL SPINE FINDINGS  No acute cervical spine fracture. Grade 1 C4-5 and grade 1 C5-6 anterolisthesis on degenerative basis. Severe C2-3 disc height loss, which may in part reflect segmentation anomaly. Severe C6-7 degenerative disc disease, moderate to severe at C4-5 and C5-6. C1-2 articulation maintained with severe arthropathy calcified apical ligament. Subcentimeter sclerotic focus in the right C6 facet may reflect bone island. Nuchal ligament calcifications. Subcentimeter calcifications seen in the right vallecular, consider direct inspection. Moderate to severe right greater left carotid bifurcation calcific atherosclerosis, Severe sternoclavicular osteoarthrosis.  Degenerative disc disease and facet arthropathy resultant mild to moderate canal stenosis at C6-7. Severe neural foraminal narrowing C3-4 thru C6-7.  IMPRESSION: CT head:  No acute intracranial process.  Severe white matter changes may reflect chronic small vessel ischemic disease. Bilateral basal ganglia and left thalamus lacunar infarcts are likely chronic. Involutional changes.  CT cervical spine: No acute fracture. Grade 1 C4-5 anterolisthesis, grade 1 C5-6 anterolisthesis on degenerative basis.   Electronically Signed   By: Elon Alas   On: 09/04/2013 23:55    Dg Knee Complete 4 Views Right  09/05/2013   CLINICAL DATA:  Right knee swelling after fall.  EXAM: RIGHT KNEE - COMPLETE 4+ VIEW  COMPARISON:  None.  FINDINGS: Mild suprapatellar joint effusion is noted. No fracture dislocation is noted. Severe narrowing and osteophyte formation of the medial lateral joint spaces is noted. Mild narrowing of the patellofemoral space is noted. No soft tissue abnormality is noted.  IMPRESSION: Severe degenerative joint disease is noted. Mild suprapatellar joint effusion is noted. No fracture or dislocation is seen.   Electronically Signed   By: Sabino Dick M.D.   On: 09/05/2013 01:18     EKG Interpretation   Date/Time:  Sunday September 04 2013 23:52:01 EDT Ventricular Rate:  81 PR Interval:  164 QRS Duration: 91 QT Interval:  428 QTC Calculation: 497 R Axis:   9 Text Interpretation:  Sinus rhythm Atrial premature complex Low voltage,  extremity leads Nonspecific T abnormalities, lateral leads Borderline  prolonged QT interval No old tracing to compare Confirmed by Hasini Peachey  MD,  [place] ([id number]) on 09/05/2013 12:37:32 AM      MDM   Final diagnoses:  Minor head injury  Fall  Dehydration  Hypertension    The patient does have evidence of minor trauma, she  does not appear to be herself per family members but does not appear acutely ill. We'll proceed with electrolytes, CBC, CT scan of the head and x-ray of the right knee to rule out other sources of the patient's possible falls. Urinalysis, EKG and troponin as well. She is not vomiting and has no complaints at this time.  Creatinine slightly elevated, otherwise workup negative for acute fractures or metabolic abnormalities. Urinalysis shows no signs of infection. The patient was given IV fluids, she was noted to be slightly hypertensive, I did a manual blood pressure and found her to be 150/105. She is unable to tell me the name of the medication that she was on in the past, the family member states that they've  thrown out. I will start lisinopril, I have gone over that side effects of the medication including angioedema, the patient and a family member had agreed that this is a good option for them and they have expressed their understanding to the indications for return.   Meds given in ED:  Medications  0.9 %  sodium chloride infusion ( Intravenous Stopped 09/05/13 0323)  sodium chloride 0.9 % bolus 1,000 mL (0 mLs Intravenous Stopped 09/05/13 0157)  lisinopril (PRINIVIL,ZESTRIL) tablet 10 mg (10 mg Oral Given 09/05/13 0351)    New Prescriptions   LISINOPRIL (PRINIVIL,ZESTRIL) 10 MG TABLET    Take 1 tablet (10 mg total) by mouth daily.        Johnna Acosta, MD 09/05/13 [phone]

## 2013-09-04 NOTE — ED Notes (Signed)
Pt arrives from home via EMS for evaluation of multiple falls and vomiting x2 today. Per EMS pt family states that pt has been falling more, falls have been unwitnessed, undetermined as to if Pt lost consciousness. Family did state pt had 2 episodes of vomiting today and has been "more sleepy." Pt does have hx of dementia, alert skin warm and dry, nad noted. Pt denies pain. EKG unremarkable.

## 2013-09-05 ENCOUNTER — Emergency Department (HOSPITAL_COMMUNITY): Payer: Self-pay

## 2013-09-05 LAB — URINALYSIS, ROUTINE W REFLEX MICROSCOPIC
GLUCOSE, UA: NEGATIVE mg/dL
Hgb urine dipstick: NEGATIVE
KETONES UR: NEGATIVE mg/dL
Nitrite: NEGATIVE
PH: 6 (ref 5.0–8.0)
Protein, ur: 30 mg/dL — AB
Specific Gravity, Urine: 1.023 (ref 1.005–1.030)
Urobilinogen, UA: 1 mg/dL (ref 0.0–1.0)

## 2013-09-05 LAB — COMPREHENSIVE METABOLIC PANEL
ALBUMIN: 3.4 g/dL — AB (ref 3.5–5.2)
ALT: 5 U/L (ref 0–35)
AST: 12 U/L (ref 0–37)
Alkaline Phosphatase: 63 U/L (ref 39–117)
BILIRUBIN TOTAL: 0.5 mg/dL (ref 0.3–1.2)
BUN: 21 mg/dL (ref 6–23)
CHLORIDE: 100 meq/L (ref 96–112)
CO2: 28 mEq/L (ref 19–32)
CREATININE: 1.24 mg/dL — AB (ref 0.50–1.10)
Calcium: 9.4 mg/dL (ref 8.4–10.5)
GFR calc Af Amer: 44 mL/min — ABNORMAL LOW (ref >90)
GFR calc non Af Amer: 38 mL/min — ABNORMAL LOW (ref >90)
Glucose, Bld: 122 mg/dL — ABNORMAL HIGH (ref 70–99)
Potassium: 3.6 mEq/L — ABNORMAL LOW (ref 3.7–5.3)
Sodium: 139 mEq/L (ref 137–147)
Total Protein: 6.6 g/dL (ref 6.0–8.3)

## 2013-09-05 LAB — CBC WITH DIFFERENTIAL/PLATELET
BASOS PCT: 0 % (ref 0–1)
Basophils Absolute: 0 10*3/uL (ref 0.0–0.1)
EOS ABS: 0 10*3/uL (ref 0.0–0.7)
Eosinophils Relative: 0 % (ref 0–5)
HCT: 40 % (ref 36.0–46.0)
HEMOGLOBIN: 13.3 g/dL (ref 12.0–15.0)
Lymphocytes Relative: 15 % (ref 12–46)
Lymphs Abs: 1.4 10*3/uL (ref 0.7–4.0)
MCH: 29.6 pg (ref 26.0–34.0)
MCHC: 33.3 g/dL (ref 30.0–36.0)
MCV: 89.1 fL (ref 78.0–100.0)
MONOS PCT: 5 % (ref 3–12)
Monocytes Absolute: 0.4 10*3/uL (ref 0.1–1.0)
NEUTROS ABS: 7.1 10*3/uL (ref 1.7–7.7)
Neutrophils Relative %: 80 % — ABNORMAL HIGH (ref 43–77)
Platelets: 200 10*3/uL (ref 150–400)
RBC: 4.49 MIL/uL (ref 3.87–5.11)
RDW: 15.8 % — ABNORMAL HIGH (ref 11.5–15.5)
WBC: 9 10*3/uL (ref 4.0–10.5)

## 2013-09-05 LAB — URINE MICROSCOPIC-ADD ON

## 2013-09-05 MED ORDER — ONDANSETRON 4 MG PO TBDP: 4.0000 mg | ORAL_TABLET | Freq: Three times a day (TID) | ORAL | Status: DC | PRN

## 2013-09-05 MED ORDER — LISINOPRIL 10 MG PO TABS
10.0000 mg | ORAL_TABLET | Freq: Once | ORAL | Status: AC
  Administered 2013-09-05: 10 mg via ORAL
  Filled 2013-09-05: qty 1

## 2013-09-05 MED ORDER — LISINOPRIL 10 MG PO TABS
10.0000 mg | ORAL_TABLET | Freq: Every day | ORAL | Status: DC
Start: 2013-09-05 — End: 2014-04-21

## 2013-09-05 MED ORDER — ONDANSETRON HCL 4 MG/2ML IJ SOLN
INTRAMUSCULAR | Status: AC
  Filled 2013-09-05: qty 2

## 2013-09-05 MED ORDER — SODIUM CHLORIDE 0.9 % IV BOLUS (SEPSIS)
1000.0000 mL | Freq: Once | INTRAVENOUS | Status: AC
  Administered 2013-09-05: 1000 mL via INTRAVENOUS

## 2013-09-05 MED ORDER — ONDANSETRON HCL 4 MG/2ML IJ SOLN
4.0000 mg | Freq: Once | INTRAMUSCULAR | Status: AC
  Administered 2013-09-05: 4 mg via INTRAVENOUS

## 2013-09-05 NOTE — ED Notes (Signed)
Discharge instructions reviewed with pt's grandson, Wal[place] Hudman.

## 2013-09-05 NOTE — ED Notes (Signed)
Lab called inquire about urine. Putting results in now.

## 2013-09-05 NOTE — ED Notes (Signed)
[facility]: [phone] DAUGHTERLORRE OPDAHL (POA): [phone]  PLEASE CALL WITH UPDATES.

## 2013-09-05 NOTE — ED Notes (Signed)
Assisted pt onto a bedpan but unable to provide a urine sample at this time.

## 2013-09-05 NOTE — Discharge Instructions (Signed)
#1 x-rays show no signs of broken bones or brain injury  #2 blood pressure is slightly elevated, you should start treatment with blood pressure medication. Because you are unable to tell me the name of the blood pressure medication that he had taken in the past I have started a medication called lisinopril. Please read the attached instructions regarding his medication.  #3 please drink plenty of fluids  #4 please see her doctor within the next 48 hours for a recheck   Emergency Department Resource Guide 1) Find a Doctor and Pay Out of Pocket Although you won't have to find out who is covered by your insurance plan, it is a good idea to ask around and get recommendations. You will then need to call the office and see if the doctor you have chosen will accept you as a new patient and what types of options they offer for patients who are self-pay. Some doctors offer discounts or will set up payment plans for their patients who do not have insurance, but you will need to ask so you aren't surprised when you get to your appointment.  2) Contact Your Local Health Department Not all health departments have doctors that can see patients for sick visits, but many do, so it is worth a call to see if yours does. If you don't know where your local health department is, you can check in your phone book. The CDC also has a tool to help you locate your state's health department, and many state websites also have listings of all of their local health departments.  3) Find a [facility] If your illness is not likely to be very severe or complicated, you may want to try a walk in clinic. These are popping up all over the country in pharmacies, drugstores, and shopping centers. They're usually staffed by nurse practitioners or physician assistants that have been trained to treat common illnesses and complaints. They're usually fairly quick and inexpensive. However, if you have serious medical issues or chronic  medical problems, these are probably not your best option.  No Primary Care Doctor: - Call Health Connect at  [phone] - they can help you locate a primary care doctor that  accepts your insurance, provides certain services, etc. - Physician Referral Service- [phone]  Chronic Pain Problems: Organization         Address  Phone   Notes  [facility]  [phone] Patients need to be referred by their primary care doctor.   Medication Assistance: Organization         Address  Phone   Notes  [facility] [address] [place], [place] [address] [phone] --Must be a resident of [facility] -- Must have NO insurance coverage whatsoever (no Medicaid/ Medicare, etc.) -- The pt. MUST have a primary care doctor that directs their care regularly and follows them in the community   MedAssist  [phone]   [employer]  [phone]    Agencies that provide inexpensive medical care: Organization         Address  Phone   Notes  [place]  [phone]   [facility]    [phone]   [facility] [place], [place] [address] [phone]   [place] [address] [place] [phone]   Planned Parenthood    [phone]   [facility]    [phone]   Community Health and [facility]  [address], [place] Phone:  [phone], Fax:  [phone] Hours of Operation:  9 am - 6 pm, M-F.  Also accepts Medicaid/Medicare and self-pay.  [facility] for [place], [address], [place] Phone: [phone], Fax: [phone]. Hours of Operation:  8:30 am - 5:30 pm, M-F.  Also accepts Medicaid and self-pay.  [facility] [place] [address] [place] Phone: [phone]   [place], [place], [place] [phone],  Ext. [phone] Mondays & Thursdays: 7-9 AM.  First 15 patients are seen on a first come, first serve basis.    [place] Providers:  Organization         Address  Phone   Notes  [facility] [address], [place] [phone] Also accepts self-pay patients.  [facility] [id number] [place], [place]  [phone]   [place], [address], [place] [phone]   [facility] [address], [place] [phone]   Lucianne Lei [address], [place]   [phone] Only accepts [place] Access [place] patients after they have their name applied to their card.   Self-Pay (no insurance) in [facility]:  Organization         Address  Phone   Notes  Sickle Cell Patients, [facility] [place] [phone]   [facility] [place] [phone]   [facility] [place]  [place], [place], [place] [phone]   [facility]/Dr. Osei-Bonsu  [address] [place] or [address], [place] [phone] Phone number for both [place] and [place] locations is the same.  Urgent Medical and [facility] [address] [place] [phone]   [facility] [address] [place] or [address] [phone]   [facility] [address] [place] [phone], phone; [phone], fax Sees patients 1st and 3rd Saturday of every month.  Must not qualify for public or private insurance (i.e. Medicaid, Medicare, [place] Health Choice, Veterans' Benefits)  Household income should be no more than 200% of the poverty level The clinic cannot treat you if you are pregnant or think you are pregnant  Sexually transmitted diseases are not  treated at the clinic.    Dental Care: Organization         Address  Phone  Notes  [facility] Department of [facility] [phone] Accepts children up to age 79 who are enrolled in [place] or [place]; pregnant women with a Medicaid card; and children who have applied for Medicaid or [place] Health Choice, but were declined, whose parents can pay a reduced fee at time of service.  [facility] Department of [facility]  [address], [place] [phone] Accepts children up to age 15 who are enrolled in [place] or [place]; pregnant women with a Medicaid card; and children who have applied for Medicaid or [place] Health Choice, but were declined, whose parents can pay a reduced fee at time of  service.  [place] Adult [facility] Access PROGRAM  [place] [phone] Patients are seen by appointment only. Walk-ins are not accepted. [place] will see patients 11 years of age and older. Monday - Tuesday (8am-5pm) Most Wednesdays (8:30-5pm) $30 per visit, cash only  [facility] Adult Dental Access PROGRAM  [address], [facility] [phone] Patients are seen by appointment only. Walk-ins are not accepted. [place] will see patients 64 years of age and older. One Wednesday Evening (Monthly: Volunteer Based).  $30 per visit, cash only  [place]  [phone] for adults; Children under age 38, call Graduate Pediatric Dentistry at [phone]. Children aged 51-14, please call [phone] to request a pediatric application.  Dental services are provided in all areas of dental care including fillings, crowns and bridges, complete and partial dentures, implants, gum treatment, root canals, and extractions. Preventive care is also provided. Treatment is provided to both adults and children. Patients are selected via a lottery and there is  often a waiting list.   [facility] [address], [place]  [phone] [email or web address]   [facility] [address] [place], [place] [phone], Ext. [phone] Second and Fourth Thursday of each month, opens at 6:30 AM; Clinic ends at 9 AM.  Patients are seen on a first-come first-served basis, and a limited number are seen during each clinic.   [facility]  [address] Hillard Danker [place], [place] [phone]   Eligibility Requirements You must have lived in [place], [place], or [place] counties for at least the last three months.   You cannot be eligible for state or federal sponsored [employer], including [employer], [place], or [employer].   You generally cannot be eligible for healthcare insurance through your employer.    How to apply: Eligibility screenings are held every Tuesday and Wednesday afternoon from 1:00 pm until 4:00 pm. You do not need an appointment for the interview!  [facility] [address] [place], [place]   [place]  [place] Department  [place]  [phone]    Behavioral Health Resources in the Community: Intensive Outpatient Programs Organization         Address  Phone  Notes  [place]. [address] [place], [place] [phone]   [facility] [address], [place], [place]   ADS: Alcohol & Drug Svcs [address] [place], [place]   [place] [address],  [place], [place] or [phone]   Substance Abuse Resources Organization         Address  Phone  Notes  Alcohol and Drug Services  [phone]   [place]  [phone]   [facility] [place]   Chinita Pester  [phone]   Residential & Outpatient Substance  Abuse Program  [phone]   Psychological Services Organization         Address  Phone  Notes  [facility] [place]  [place]  [phone]   [place] [address] [place] [phone] or [phone]    Mobile Crisis Teams Organization         Address  Phone  Notes  [facility], Mobile Crisis Care Unit  [phone]   Assertive Psychotherapeutic Services  [place], [place]   [facility] [address] [place] [phone]    Self-Help/Support Groups Organization         Address  Phone             Notes  [place]. of [place] - variety of support groups  [place] Call for more information  Narcotics Anonymous (NA), [facility] [address], [employer] [place]  2 meetings at this location   Special educational needs teacher         Address  Phone  Notes  [facility] Residential Treatment [place],    [place]  [phone]   [facility]  [address], [place] [id number], [place], [place]   [place], [place] [phone] Admissions: 8am-3pm M-F  Incentives Substance [place] [address],    [place], [place] [phone]   [facility] [address] [place], [place], [place]   [facility] [address],  [place], [place]   [facility]ight Programs - Intensive Outpatient [address] Kristeen [address], [place], [place]   [facility] ([place].,  [place], [place] [phone] or [phone]   [facility]idential Treatment Services (RTS) [address] [place], [place] Accepts Medicaid  [facility] [place] [address]  [place] [phone] Substance Abuse/Addiction Treatment   [facility] Organization          Address  Phone  Notes  [facility]  [phone]   Domenic Schwab, PhD [address] Arlis [place], [place]   [phone] or [phone]   [facility] [place], [place] [phone]   [facility] [address] [place], [place] [phone] Insurance/Medicaid/sponsorship through [facility] and Families [address]                                    [place], [place] [phone] [place] [address], [place] [phone]    Dr. Adele Schilder  [phone]   [facility] Dept. 1) [address], [place] 2) [place] 3)  [place] [address], [place] [phone]  [phone]   [place] [phone] or [phone] (After Hours)

## 2013-09-05 NOTE — ED Notes (Signed)
Pt actively vomiting in room. MD informed.

## 2013-09-05 NOTE — ED Notes (Signed)
MD aware of pt's BP.  

## 2014-04-17 ENCOUNTER — Emergency Department (HOSPITAL_COMMUNITY): Payer: PRIVATE HEALTH INSURANCE

## 2014-04-17 ENCOUNTER — Inpatient Hospital Stay (HOSPITAL_COMMUNITY)
Admission: EM | Admit: 2014-04-17 | Discharge: 2014-04-21 | DRG: 065 | Disposition: A | Discharge status: Skilled Nursing Facility | Payer: PRIVATE HEALTH INSURANCE | Attending: Internal Medicine | Admitting: Internal Medicine

## 2014-04-17 ENCOUNTER — Encounter (HOSPITAL_COMMUNITY): Payer: Self-pay | Admitting: Emergency Medicine

## 2014-04-17 DIAGNOSIS — I495 Sick sinus syndrome: Secondary | ICD-10-CM | POA: Diagnosis present

## 2014-04-17 DIAGNOSIS — I639 Cerebral infarction, unspecified: Secondary | ICD-10-CM

## 2014-04-17 DIAGNOSIS — R4182 Altered mental status, unspecified: Secondary | ICD-10-CM

## 2014-04-17 DIAGNOSIS — I6300 Cerebral infarction due to thrombosis of unspecified precerebral artery: Secondary | ICD-10-CM

## 2014-04-17 DIAGNOSIS — I480 Paroxysmal atrial fibrillation: Secondary | ICD-10-CM | POA: Diagnosis present

## 2014-04-17 DIAGNOSIS — R000 Tachycardia, unspecified: Secondary | ICD-10-CM | POA: Diagnosis present

## 2014-04-17 DIAGNOSIS — I672 Cerebral atherosclerosis: Secondary | ICD-10-CM | POA: Diagnosis present

## 2014-04-17 DIAGNOSIS — E780 Pure hypercholesterolemia: Secondary | ICD-10-CM | POA: Diagnosis present

## 2014-04-17 DIAGNOSIS — Z7982 Long term (current) use of aspirin: Secondary | ICD-10-CM | POA: Diagnosis not present

## 2014-04-17 DIAGNOSIS — I6931 Cognitive deficits following cerebral infarction: Secondary | ICD-10-CM | POA: Diagnosis present

## 2014-04-17 DIAGNOSIS — Z7901 Long term (current) use of anticoagulants: Secondary | ICD-10-CM | POA: Diagnosis not present

## 2014-04-17 DIAGNOSIS — I6381 Other cerebral infarction due to occlusion or stenosis of small artery: Secondary | ICD-10-CM

## 2014-04-17 DIAGNOSIS — F0390 Unspecified dementia without behavioral disturbance: Secondary | ICD-10-CM | POA: Diagnosis not present

## 2014-04-17 DIAGNOSIS — I638 Other cerebral infarction: Principal | ICD-10-CM | POA: Diagnosis present

## 2014-04-17 DIAGNOSIS — Z66 Do not resuscitate: Secondary | ICD-10-CM | POA: Diagnosis present

## 2014-04-17 DIAGNOSIS — R471 Dysarthria and anarthria: Secondary | ICD-10-CM | POA: Diagnosis present

## 2014-04-17 DIAGNOSIS — I69354 Hemiplegia and hemiparesis following cerebral infarction affecting left non-dominant side: Secondary | ICD-10-CM | POA: Diagnosis not present

## 2014-04-17 DIAGNOSIS — Z853 Personal history of malignant neoplasm of breast: Secondary | ICD-10-CM

## 2014-04-17 DIAGNOSIS — I10 Essential (primary) hypertension: Secondary | ICD-10-CM | POA: Diagnosis present

## 2014-04-17 DIAGNOSIS — F01518 Vascular dementia, unspecified severity, with other behavioral disturbance: Secondary | ICD-10-CM | POA: Diagnosis present

## 2014-04-17 DIAGNOSIS — E785 Hyperlipidemia, unspecified: Secondary | ICD-10-CM | POA: Diagnosis present

## 2014-04-17 DIAGNOSIS — F0150 Vascular dementia without behavioral disturbance: Secondary | ICD-10-CM | POA: Diagnosis present

## 2014-04-17 DIAGNOSIS — F0151 Vascular dementia with behavioral disturbance: Secondary | ICD-10-CM | POA: Diagnosis present

## 2014-04-17 HISTORY — DX: Other cerebral infarction due to occlusion or stenosis of small artery: I63.81

## 2014-04-17 HISTORY — DX: Cerebral infarction, unspecified: I63.9

## 2014-04-17 LAB — URINALYSIS, ROUTINE W REFLEX MICROSCOPIC
Bilirubin Urine: NEGATIVE
GLUCOSE, UA: NEGATIVE mg/dL
HGB URINE DIPSTICK: NEGATIVE
Ketones, ur: NEGATIVE mg/dL
LEUKOCYTES UA: NEGATIVE
Nitrite: NEGATIVE
PROTEIN: NEGATIVE mg/dL
SPECIFIC GRAVITY, URINE: 1.015 (ref 1.005–1.030)
UROBILINOGEN UA: 0.2 mg/dL (ref 0.0–1.0)
pH: 6 (ref 5.0–8.0)

## 2014-04-17 LAB — CBC WITH DIFFERENTIAL/PLATELET
Basophils Absolute: 0 10*3/uL (ref 0.0–0.1)
Basophils Relative: 0 % (ref 0–1)
EOS ABS: 0 10*3/uL (ref 0.0–0.7)
Eosinophils Relative: 1 % (ref 0–5)
HEMATOCRIT: 40.4 % (ref 36.0–46.0)
HEMOGLOBIN: 13.6 g/dL (ref 12.0–15.0)
Lymphocytes Relative: 23 % (ref 12–46)
Lymphs Abs: 1.5 10*3/uL (ref 0.7–4.0)
MCH: 29.6 pg (ref 26.0–34.0)
MCHC: 33.7 g/dL (ref 30.0–36.0)
MCV: 88 fL (ref 78.0–100.0)
MONO ABS: 0.3 10*3/uL (ref 0.1–1.0)
MONOS PCT: 5 % (ref 3–12)
Neutro Abs: 4.7 10*3/uL (ref 1.7–7.7)
Neutrophils Relative %: 71 % (ref 43–77)
Platelets: 241 10*3/uL (ref 150–400)
RBC: 4.59 MIL/uL (ref 3.87–5.11)
RDW: 14.3 % (ref 11.5–15.5)
WBC: 6.6 10*3/uL (ref 4.0–10.5)

## 2014-04-17 LAB — BASIC METABOLIC PANEL
Anion gap: 15 (ref 5–15)
BUN: 20 mg/dL (ref 6–23)
CO2: 24 mEq/L (ref 19–32)
CREATININE: 1.23 mg/dL — AB (ref 0.50–1.10)
Calcium: 9.5 mg/dL (ref 8.4–10.5)
Chloride: 101 mEq/L (ref 96–112)
GFR, EST AFRICAN AMERICAN: 44 mL/min — AB (ref >90)
GFR, EST NON AFRICAN AMERICAN: 38 mL/min — AB (ref >90)
Glucose, Bld: 98 mg/dL (ref 70–99)
Potassium: 3.7 mEq/L (ref 3.7–5.3)
Sodium: 140 mEq/L (ref 137–147)

## 2014-04-17 LAB — CK: Total CK: 65 U/L (ref 7–177)

## 2014-04-17 LAB — LACTIC ACID, PLASMA: LACTIC ACID, VENOUS: 0.2 mmol/L — AB (ref 0.5–2.2)

## 2014-04-17 LAB — CBG MONITORING, ED: Glucose-Capillary: 90 mg/dL (ref 70–99)

## 2014-04-17 LAB — TROPONIN I: Troponin I: <0.3 ng/mL (ref <0.30)

## 2014-04-17 MED ORDER — ASPIRIN 325 MG PO TABS
325.0000 mg | ORAL_TABLET | Freq: Once | ORAL | Status: AC
  Administered 2014-04-17: 325 mg via ORAL
  Filled 2014-04-17: qty 1

## 2014-04-17 MED ORDER — STROKE: EARLY STAGES OF RECOVERY BOOK
Freq: Once | Status: AC
  Administered 2014-04-17
  Filled 2014-04-17: qty 1

## 2014-04-17 MED ORDER — HEPARIN SODIUM (PORCINE) 5000 UNIT/ML IJ SOLN
5000.0000 [IU] | Freq: Three times a day (TID) | INTRAMUSCULAR | Status: DC
  Administered 2014-04-18 – 2014-04-20 (×7): 5000 [IU] via SUBCUTANEOUS
  Filled 2014-04-17 (×7): qty 1

## 2014-04-17 MED ORDER — ASPIRIN 81 MG PO CHEW
81.0000 mg | CHEWABLE_TABLET | Freq: Every day | ORAL | Status: DC
  Administered 2014-04-18 – 2014-04-20 (×3): 81 mg via ORAL
  Filled 2014-04-17 (×3): qty 1

## 2014-04-17 NOTE — ED Notes (Signed)
Family at bedside. 

## 2014-04-17 NOTE — H&P (Signed)
Date: 04/17/2014               Patient Name:  Cindy Trevino MRN: [id number]  DOB: 1924-11-09 Age / Sex: 80 y.o., female   PCP: No primary provider on file.         Medical Service: Internal Medicine Teaching Service         Attending Physician: Dr. Aldine Contes, MD    First Contact: Dr. Lottie Mussel Pager: [phone]  Second Contact: Dr. Duwaine Maxin Pager: [phone]       After Hours (After 5p/  First Contact Pager: [phone]  weekends / holidays): Second Contact Pager: [phone]   Chief Complaint: L arm and leg weakness  History of Present Illness: Cindy Trevino is an 80 year old woman with dementia and HTN who presents with L sided weakness. History was limited as the patient has dementia and her grandson who was in ED was not present earlier in day and was unsure of the details. The patient is unaware of why she is in the hospital. The patient was seen at her baseline at around 10 am by another grandson and was then found at 11 am by her home nurse aid. There are conflicting reports about whether she had fallen but she was allegedly altered above her baseline. She also complains of L hand and leg weakness. The grandson said she struggled to hold a sandwich when she was trying to eat it. He is unsure if she has had any slurred speech. He says she is at her baseline mental status and is unaware of any recent medical issues. She has never had an episode like this before. She denied any type of pain including chest, abdominal, head, or extremity, as well as fever, cough, dysuria, polyuria, diarrhea.   Meds: Current Facility-Administered Medications  Medication Dose Route Frequency Provider Last Rate Last Dose  . [START ON 04/18/2014] aspirin chewable tablet 81 mg  81 mg Oral Daily Kelby Aline, MD      . aspirin tablet 325 mg  325 mg Oral Once Kelby Aline, MD       Current Outpatient Prescriptions  Medication Sig Dispense Refill  . metoprolol succinate (TOPROL-XL) 25 MG 24 hr tablet  Take 25 mg by mouth daily.      Marland Kitchen lisinopril (PRINIVIL,ZESTRIL) 10 MG tablet Take 1 tablet (10 mg total) by mouth daily.  30 tablet  1    Allergies: Allergies as of 04/17/2014  . (No Known Allergies)   Past Medical History  Diagnosis Date  . Dementia   . Dementia   . Hypertension    History reviewed. No pertinent past surgical history. No family history on file. History   Social History  . Marital Status: Single    Spouse Name: N/A    Number of Children: N/A  . Years of Education: N/A   Occupational History  . Not on file.   Social History Main Topics  . Smoking status: Never Smoker   . Smokeless tobacco: Not on file  . Alcohol Use: No  . Drug Use: Not on file  . Sexual Activity: Not on file   Other Topics Concern  . Not on file   Social History Narrative  . No narrative on file    Review of Systems: A comprehensive review of systems was negative except for: as noted above in HPI  Physical Exam: Blood pressure 173/104, pulse 73, temperature 97.7 F (36.5 C), temperature source Oral, resp. rate 20, SpO2 97.00%.  BP 173/104  Pulse 73  Temp(Src) 97.7 F (36.5 C) (Oral)  Resp 20  SpO2 97%  General Appearance:    Alert and oriented to person and place but not time or reason for being here , pleasant, attempts to cooperate, no distress, appears stated age  HEENT:    Normocephalic, without obvious abnormality, atraumatic, PERRL, EOMI, MMM  Neck:   No carotid bruit or JVD  Back:     Kyphotic, no CVA tenderness  Lungs:     Clear to auscultation bilaterally, respirations unlabored  Chest Wall:    No tenderness or deformity   Heart:    Regular rate and rhythm, S1 and S2 normal, no murmur, rub   or gallop  Abdomen:     Soft, non-tender, bowel sounds active all four quadrants,    no masses, no organomegaly  Extremities:   Extremities normal, atraumatic, no cyanosis or edema  Pulses:   2+ and symmetric all extremities  Neurologic:   CNII-XII intact, question of  effort on strength tests but R ULE and LE 5/5, question of weaker LUE and LLE but still 5/5    Lab results: Basic Metabolic Panel:  Recent Labs  04/17/14 1313  NA 140  K 3.7  CL 101  CO2 24  GLUCOSE 98  BUN 20  CREATININE 1.23*  CALCIUM 9.5  CBC:  Recent Labs  04/17/14 1313  WBC 6.6  NEUTROABS 4.7  HGB 13.6  HCT 40.4  MCV 88.0  PLT 241   Cardiac Enzymes:  Recent Labs  04/17/14 1313  CKTOTAL 65  TROPONINI <0.30   CBG:  Recent Labs  04/17/14 1338  GLUCAP 90   Urinalysis:  Recent Labs  04/17/14 1340  COLORURINE YELLOW  LABSPEC 1.015  PHURINE 6.0  GLUCOSEU NEGATIVE  HGBUR NEGATIVE  BILIRUBINUR NEGATIVE  KETONESUR NEGATIVE  PROTEINUR NEGATIVE  UROBILINOGEN 0.2  NITRITE NEGATIVE  LEUKOCYTESUR NEGATIVE   Misc. Labs: CK 65 Lactic acid 0.2  Imaging results:  Dg Chest 2 View  04/17/2014   CLINICAL DATA:  Altered mental status.  EXAM: CHEST  2 VIEW  COMPARISON:  None.  FINDINGS: The heart size and mediastinal contours are within normal limits. Both lungs are clear. No pneumothorax or pleural effusion is noted. The visualized skeletal structures are unremarkable.  IMPRESSION: No acute cardiopulmonary abnormality seen.   Electronically Signed   By: Sabino Dick M.D.   On: 04/17/2014 14:30   Dg Lumbar Spine Complete  04/17/2014   CLINICAL DATA:  Altered mental status.  EXAM: LUMBAR SPINE - COMPLETE 4+ VIEW  COMPARISON:  None.  FINDINGS: No fracture is noted. Grade 1 anterolisthesis of L3-4 is noted secondary to posterior facet joint hypertrophy. Moderate degenerative disc disease is noted at this level. Severe degenerative disc disease is noted at L4-5 and L5-S1. Lumbarized S1 vertebral body is noted.  IMPRESSION: Severe multilevel degenerative disc disease is noted. No acute abnormality seen in the lumbar spine.   Electronically Signed   By: Sabino Dick M.D.   On: 04/17/2014 14:34   Ct Head Wo Contrast  04/17/2014   CLINICAL DATA:  Initial encounter for  fall yesterday with possible slurred speech and left arm weakness.  EXAM: CT HEAD WITHOUT CONTRAST  CT CERVICAL SPINE WITHOUT CONTRAST  TECHNIQUE: Multidetector CT imaging of the head and cervical spine was performed following the standard protocol without intravenous contrast. Multiplanar CT image reconstructions of the cervical spine were also generated.  COMPARISON:  09/04/2013.  FINDINGS: CT HEAD FINDINGS  There is no evidence for acute hemorrhage, hydrocephalus, mass lesion, or abnormal extra-axial fluid collection. Patchy low attenuation in the deep hemispheric and periventricular white matter is nonspecific, but likely reflects chronic microvascular ischemic demyelination. Chronic bilateral lacunar infarcts are seen in the basal ganglia. On image the 16, there is a new hypodensity in the left containment which has imaging features compatible with age indeterminate infarct although it does appear new since 09/04/2013. Diffuse loss of parenchymal volume is consistent with atrophy.  The visualized paranasal sinuses and mastoid air cells are clear. No evidence for skull fracture.  CT CERVICAL SPINE FINDINGS  Imaging was obtained from the skullbase through the T1 vertebral body. There is no evidence for an acute fracture. Marked loss of disc height is seen at C2-3. There is loss of disc height with endplate degeneration at C6-7 and to a lesser degree at C5-6. Trace anterolisthesis of C4-5 is compatible with the degree of facet degeneration at this level. Similar trace anterolisthesis is seen at C5-6, stable. There is diffuse facet osteoarthritis throughout bilaterally. No prevertebral soft tissue swelling.  IMPRESSION: Atrophy with chronic small vessel white matter ischemic disease. There is a new (since 09/04/2013) age indeterminate lacunar infarct in the left basal ganglia.  No cervical spine fracture although diffuse degenerative changes are evident.   Electronically Signed   By: Misty Stanley M.D.   On:  04/17/2014 14:31   Ct Cervical Spine Wo Contrast  04/17/2014   CLINICAL DATA:  Initial encounter for fall yesterday with possible slurred speech and left arm weakness.  EXAM: CT HEAD WITHOUT CONTRAST  CT CERVICAL SPINE WITHOUT CONTRAST  TECHNIQUE: Multidetector CT imaging of the head and cervical spine was performed following the standard protocol without intravenous contrast. Multiplanar CT image reconstructions of the cervical spine were also generated.  COMPARISON:  09/04/2013.  FINDINGS: CT HEAD FINDINGS  There is no evidence for acute hemorrhage, hydrocephalus, mass lesion, or abnormal extra-axial fluid collection. Patchy low attenuation in the deep hemispheric and periventricular white matter is nonspecific, but likely reflects chronic microvascular ischemic demyelination. Chronic bilateral lacunar infarcts are seen in the basal ganglia. On image the 16, there is a new hypodensity in the left containment which has imaging features compatible with age indeterminate infarct although it does appear new since 09/04/2013. Diffuse loss of parenchymal volume is consistent with atrophy.  The visualized paranasal sinuses and mastoid air cells are clear. No evidence for skull fracture.  CT CERVICAL SPINE FINDINGS  Imaging was obtained from the skullbase through the T1 vertebral body. There is no evidence for an acute fracture. Marked loss of disc height is seen at C2-3. There is loss of disc height with endplate degeneration at C6-7 and to a lesser degree at C5-6. Trace anterolisthesis of C4-5 is compatible with the degree of facet degeneration at this level. Similar trace anterolisthesis is seen at C5-6, stable. There is diffuse facet osteoarthritis throughout bilaterally. No prevertebral soft tissue swelling.  IMPRESSION: Atrophy with chronic small vessel white matter ischemic disease. There is a new (since 09/04/2013) age indeterminate lacunar infarct in the left basal ganglia.  No cervical spine fracture  although diffuse degenerative changes are evident.   Electronically Signed   By: Misty Stanley M.D.   On: 04/17/2014 14:31    Other results: EKG: no change from 08/2013, sinus rhythm rate 71, PR 150, QTc 482  Assessment & Plan by Problem: Active Problems:   CVA (cerebral infarction)  #Left-sided Weakness: History unclear but concern that the  patient was confused above baseline with L sided weakness. Questionable left sided upper and lower extremity weakness on exam but rest of neuro exam other than orientation as noted above is normal. Per grandson, no known history of previous CVA. Head CT notable for new (since 08/2013) age indeterminate lacunar infarct in the L basal ganglia. However, this would not explain L sided weakness. Cardiac etiology unlikely as troponin negative and EKG unchanged. No history of seizures.  -appreciate neurology consult -ASA 325 mg po now and ASA 81 daily starting tomorrow -consider brain MRI, echo, carotid dopplers -check HgbA1c, lipid panel -cardiac monitoring -swallow screen -PT/OT  #HTN: BP 150s-180s/80s-100s. On metoprolol 25 mg daily and lisinopril 10 mg daily. -hold BP meds for now in setting of possible cerebrovascular event  #Dementia: Patient alert and oriented to person and place but not date or why she is here. [name] says she is at baseline. Likely secondary to chronic microvascular ischemic demyelination and chronic bilateral lacunar infarcts on head CT.  -Appreciate neuro consult  #Diet: NPO  #VTE PPX: heparin 5000 u [place]  Dispo: Disposition is deferred at this time, awaiting improvement of current medical problems. Anticipated discharge in approximately 2 day(s).   The patient does not know have a current PCP (No primary provider on file.) and does need an [facility] hospital follow-up appointment after discharge.  The patient does not know have transportation limitations that hinder transportation to clinic appointments.  Signed: Kelby Aline,  MD 04/17/2014, 7:39 PM

## 2014-04-17 NOTE — ED Notes (Signed)
CBG:90 

## 2014-04-17 NOTE — Consult Note (Signed)
Referring Physician: IM teaching service    Chief Complaint: dysarthria and left hand weakness  HPI:                                                                                                                                         Cindy Trevino is an 80 y.o. female with a past medical history significant for HTN and dementia, brought in by EMS for further evaluation of the above stated symptoms. Cindy Trevino is a the bedside and said that patient's home aide found her on the floor and she was noted to be " little altered" with spurred speech and left hand weakness around 10:30 or 11 am today. Patient's grandson said that she is also noticing some droopiness of the left corner of her mouth. Cindy Trevino said that " I just can not use the left hand".Unsure of LSN, possibly Friday Denies HA, vertigo, double vision, difficulty swallowing, or visual disturbances. CT brain revealed " a new (since 09/04/2013) age indeterminate lacunar infarct in the left basal ganglia".  Date last known well: unable to determine Time last known well: unable to determine tPA Given: no, late presentation   Past Medical History  Diagnosis Date  . Dementia   . Dementia   . Hypertension     History reviewed. No pertinent past surgical history.  No family history on file. Social History:  reports that she has never smoked. She does not have any smokeless tobacco history on file. She reports that she does not drink alcohol. Her drug history is not on file.  Allergies: No Known Allergies  Medications:                                                                                                                           I have reviewed the patient's current medications.  ROS:  Unable to perform due to underlying dementia  History obtained from chart review and family.  Physical exam: pleasant female in no apparent  distress. Blood pressure 145/96, pulse 89, temperature 97.7 F (36.5 C), temperature source Oral, resp. rate 18, SpO2 96.00%. Head: normocephalic. Neck: supple, no bruits, no JVD. Cardiac: no murmurs. Lungs: clear. Abdomen: soft, no tender, no mass. Extremities: no edema. Neurologic Examination:                                                                                                      General: Mental Status: Alert, awake, disoriented x 3.  Speech with ? dysarthria but without evidence of aphasia.  Able to follow 3rd step commands without difficulty. Cranial Nerves: II: Discs flat bilaterally; Visual fields grossly normal, pupils equal, round, reactive to light and accommodation III,IV, VI: ptosis not present, extra-ocular motions intact bilaterally V,VII: smile symmetric, facial light touch sensation normal bilaterally VIII: hearing normal bilaterally IX,X: gag reflex present XI: bilateral shoulder shrug XII: midline tongue extension without atrophy or fasciculations Motor: Significant for mild left arm weakness Tone and bulk:normal tone throughout; no atrophy noted Sensory: Pinprick and light touch intact throughout, bilaterally Deep Tendon Reflexes:  1 all over Plantars: Right: downgoing   Left: upgoing Cerebellar: Impaired finger to nose in the left because of hand weakness Gait: No tested for safety reasons    Results for orders placed during the hospital encounter of 04/17/14 (from the past 48 hour(s))  CBC WITH DIFFERENTIAL     Status: None   Collection Time    04/17/14  1:13 PM      Result Value Ref Range   WBC 6.6  4.0 - 10.5 K/uL   RBC 4.59  3.87 - 5.11 MIL/uL   Hemoglobin 13.6  12.0 - 15.0 g/dL   HCT 40.4  36.0 - 46.0 %   MCV 88.0  78.0 - 100.0 fL   MCH 29.6  26.0 - 34.0 pg   MCHC 33.7  30.0 - 36.0 g/dL   RDW 14.3  11.5 - 15.5 %   Platelets 241  150 - 400 K/uL   Neutrophils Relative % 71  43 - 77 %   Neutro Abs 4.7  1.7 - 7.7 K/uL   Lymphocytes  Relative 23  12 - 46 %   Lymphs Abs 1.5  0.7 - 4.0 K/uL   Monocytes Relative 5  3 - 12 %   Monocytes Absolute 0.3  0.1 - 1.0 K/uL   Eosinophils Relative 1  0 - 5 %   Eosinophils Absolute 0.0  0.0 - 0.7 K/uL   Basophils Relative 0  0 - 1 %   Basophils Absolute 0.0  0.0 - 0.1 K/uL  BASIC METABOLIC PANEL     Status: Abnormal   Collection Time    04/17/14  1:13 PM      Result Value Ref Range   Sodium 140  137 - 147 mEq/L   Potassium 3.7  3.7 - 5.3 mEq/L   Chloride 101  96 - 112 mEq/L   CO2 24  19 - 32 mEq/L   Glucose, Bld 98  70 - 99 mg/dL   BUN 20  6 - 23 mg/dL   Creatinine, Ser 1.23 (*) 0.50 - 1.10 mg/dL   Calcium 9.5  8.4 - 10.5 mg/dL   GFR calc non Af Amer 38 (*) >90 mL/min   GFR calc Af Amer 44 (*) >90 mL/min   Comment: (NOTE)     The eGFR has been calculated using the CKD EPI equation.     This calculation has not been validated in all clinical situations.     eGFR's persistently <90 mL/min signify possible Chronic Kidney     Disease.   Anion gap 15  5 - 15  TROPONIN I     Status: None   Collection Time    04/17/14  1:13 PM      Result Value Ref Range   Troponin I <0.30  <0.30 ng/mL   Comment:            Due to the release kinetics of cTnI,     a negative result within the first hours     of the onset of symptoms does not rule out     myocardial infarction with certainty.     If myocardial infarction is still suspected,     repeat the test at appropriate intervals.  LACTIC ACID, PLASMA     Status: Abnormal   Collection Time    04/17/14  1:13 PM      Result Value Ref Range   Lactic Acid, Venous 0.2 (*) 0.5 - 2.2 mmol/L  CK     Status: None   Collection Time    04/17/14  1:13 PM      Result Value Ref Range   Total CK 65  7 - 177 U/L  CBG MONITORING, ED     Status: None   Collection Time    04/17/14  1:38 PM      Result Value Ref Range   Glucose-Capillary 90  70 - 99 mg/dL  URINALYSIS, ROUTINE W REFLEX MICROSCOPIC     Status: None   Collection Time    04/17/14   1:40 PM      Result Value Ref Range   Color, Urine YELLOW  YELLOW   APPearance CLEAR  CLEAR   Specific Gravity, Urine 1.015  1.005 - 1.030   pH 6.0  5.0 - 8.0   Glucose, UA NEGATIVE  NEGATIVE mg/dL   Hgb urine dipstick NEGATIVE  NEGATIVE   Bilirubin Urine NEGATIVE  NEGATIVE   Ketones, ur NEGATIVE  NEGATIVE mg/dL   Protein, ur NEGATIVE  NEGATIVE mg/dL   Urobilinogen, UA 0.2  0.0 - 1.0 mg/dL   Nitrite NEGATIVE  NEGATIVE   Leukocytes, UA NEGATIVE  NEGATIVE   Comment: MICROSCOPIC NOT DONE ON URINES WITH NEGATIVE PROTEIN, BLOOD, LEUKOCYTES, NITRITE, OR GLUCOSE <1000 mg/dL.   Dg Chest 2 View  04/17/2014   CLINICAL DATA:  Altered mental status.  EXAM: CHEST  2 VIEW  COMPARISON:  None.  FINDINGS: The heart size and mediastinal contours are within normal limits. Both lungs are clear. No pneumothorax or pleural effusion is noted. The visualized skeletal structures are unremarkable.  IMPRESSION: No acute cardiopulmonary abnormality seen.   Electronically Signed   By: Sabino Dick M.D.   On: 04/17/2014 14:30   Dg Lumbar Spine Complete  04/17/2014   CLINICAL DATA:  Altered mental status.  EXAM: LUMBAR SPINE - COMPLETE 4+ VIEW  COMPARISON:  None.  FINDINGS: No fracture is noted. Grade 1 anterolisthesis of L3-4  is noted secondary to posterior facet joint hypertrophy. Moderate degenerative disc disease is noted at this level. Severe degenerative disc disease is noted at L4-5 and L5-S1. Lumbarized S1 vertebral body is noted.  IMPRESSION: Severe multilevel degenerative disc disease is noted. No acute abnormality seen in the lumbar spine.   Electronically Signed   By: Sabino Dick M.D.   On: 04/17/2014 14:34   Ct Head Wo Contrast  04/17/2014   CLINICAL DATA:  Initial encounter for fall yesterday with possible slurred speech and left arm weakness.  EXAM: CT HEAD WITHOUT CONTRAST  CT CERVICAL SPINE WITHOUT CONTRAST  TECHNIQUE: Multidetector CT imaging of the head and cervical spine was performed following the  standard protocol without intravenous contrast. Multiplanar CT image reconstructions of the cervical spine were also generated.  COMPARISON:  09/04/2013.  FINDINGS: CT HEAD FINDINGS  There is no evidence for acute hemorrhage, hydrocephalus, mass lesion, or abnormal extra-axial fluid collection. Patchy low attenuation in the deep hemispheric and periventricular white matter is nonspecific, but likely reflects chronic microvascular ischemic demyelination. Chronic bilateral lacunar infarcts are seen in the basal ganglia. On image the 16, there is a new hypodensity in the left containment which has imaging features compatible with age indeterminate infarct although it does appear new since 09/04/2013. Diffuse loss of parenchymal volume is consistent with atrophy.  The visualized paranasal sinuses and mastoid air cells are clear. No evidence for skull fracture.  CT CERVICAL SPINE FINDINGS  Imaging was obtained from the skullbase through the T1 vertebral body. There is no evidence for an acute fracture. Marked loss of disc height is seen at C2-3. There is loss of disc height with endplate degeneration at C6-7 and to a lesser degree at C5-6. Trace anterolisthesis of C4-5 is compatible with the degree of facet degeneration at this level. Similar trace anterolisthesis is seen at C5-6, stable. There is diffuse facet osteoarthritis throughout bilaterally. No prevertebral soft tissue swelling.  IMPRESSION: Atrophy with chronic small vessel white matter ischemic disease. There is a new (since 09/04/2013) age indeterminate lacunar infarct in the left basal ganglia.  No cervical spine fracture although diffuse degenerative changes are evident.   Electronically Signed   By: Misty Stanley M.D.   On: 04/17/2014 14:31   Ct Cervical Spine Wo Contrast  04/17/2014   CLINICAL DATA:  Initial encounter for fall yesterday with possible slurred speech and left arm weakness.  EXAM: CT HEAD WITHOUT CONTRAST  CT CERVICAL SPINE WITHOUT  CONTRAST  TECHNIQUE: Multidetector CT imaging of the head and cervical spine was performed following the standard protocol without intravenous contrast. Multiplanar CT image reconstructions of the cervical spine were also generated.  COMPARISON:  09/04/2013.  FINDINGS: CT HEAD FINDINGS  There is no evidence for acute hemorrhage, hydrocephalus, mass lesion, or abnormal extra-axial fluid collection. Patchy low attenuation in the deep hemispheric and periventricular white matter is nonspecific, but likely reflects chronic microvascular ischemic demyelination. Chronic bilateral lacunar infarcts are seen in the basal ganglia. On image the 16, there is a new hypodensity in the left containment which has imaging features compatible with age indeterminate infarct although it does appear new since 09/04/2013. Diffuse loss of parenchymal volume is consistent with atrophy.  The visualized paranasal sinuses and mastoid air cells are clear. No evidence for skull fracture.  CT CERVICAL SPINE FINDINGS  Imaging was obtained from the skullbase through the T1 vertebral body. There is no evidence for an acute fracture. Marked loss of disc height is seen at C2-3. There  is loss of disc height with endplate degeneration at C6-7 and to a lesser degree at C5-6. Trace anterolisthesis of C4-5 is compatible with the degree of facet degeneration at this level. Similar trace anterolisthesis is seen at C5-6, stable. There is diffuse facet osteoarthritis throughout bilaterally. No prevertebral soft tissue swelling.  IMPRESSION: Atrophy with chronic small vessel white matter ischemic disease. There is a new (since 09/04/2013) age indeterminate lacunar infarct in the left basal ganglia.  No cervical spine fracture although diffuse degenerative changes are evident.   Electronically Signed   By: Misty Stanley M.D.   On: 04/17/2014 14:31    Assessment: 80 y.o. female with new onset left hand weakness and dysarthria. CT brain revealed possible age  indeterminate left BG lacunar infarct which doesn't explain patient's syndrome. Suspect small right brain infarct, likely cortical. Agree with completing stroke work up. Aspirin. Stroke team will follow up tomorrow.  Stroke Risk Factors - age, HTN    Dorian Pod, MD [facility] [phone]  04/17/2014, 6:50 PM

## 2014-04-17 NOTE — ED Provider Notes (Signed)
CSN: [id number]     Arrival date & time 04/17/14  1222 History   First MD Initiated Contact with Patient 04/17/14 1223     Chief Complaint  Patient presents with  . Altered Mental Status   (Consider location/radiation/quality/duration/timing/severity/associated sxs/prior Treatment) HPI Cindy Trevino is an 80 yo female presenting from home with report of altered mental status this am.  Grandson at bedside states she lives with him and another grandson.  They left house this am appr 10 am and pt was still in bed.  Home health aide arrived at 11 am and found pt lying in floor and thought she had slurred speech and her left was weak.  She reports thinks she got dizzy and slipped this am but unsure.  Pt denies any pain currently.    Past Medical History  Diagnosis Date  . Dementia   . Dementia   . Hypertension    History reviewed. No pertinent past surgical history. No family history on file. History  Substance Use Topics  . Smoking status: Never Smoker   . Smokeless tobacco: Not on file  . Alcohol Use: No   OB History   Grav Para Term Preterm Abortions TAB SAB Ect Mult Living                 Review of Systems  Unable to perform ROS: Dementia    Allergies  Review of patient's allergies indicates no known allergies.  Home Medications   Prior to Admission medications   Medication Sig Start Date End Date Taking? Authorizing Provider  lisinopril (PRINIVIL,ZESTRIL) 10 MG tablet Take 1 tablet (10 mg total) by mouth daily. 09/05/13   Johnna Acosta, MD  ondansetron (ZOFRAN ODT) 4 MG disintegrating tablet Take 1 tablet (4 mg total) by mouth every 8 (eight) hours as needed for nausea. 09/05/13   Johnna Acosta, MD   BP 151/97  Pulse 72  Temp(Src) 97.7 F (36.5 C) (Oral)  Resp 18  SpO2 98% Physical Exam  Nursing note and vitals reviewed. Constitutional: She appears well-developed and well-nourished. No distress.  HENT:  Head: Normocephalic and atraumatic.  Mouth/Throat:  Oropharynx is clear and moist. No oropharyngeal exudate.  Eyes: Conjunctivae are normal.  Neck: Neck supple. No thyromegaly present.  Cardiovascular: Normal rate, regular rhythm and intact distal pulses.   Pulmonary/Chest: Effort normal and breath sounds normal. No respiratory distress. She has no wheezes. She has no rales. She exhibits no tenderness.  Abdominal: Soft. There is no tenderness.  Musculoskeletal: She exhibits no tenderness.  Lymphadenopathy:    She has no cervical adenopathy.  Neurological: She is alert. She is disoriented. A cranial nerve deficit is present. GCS eye subscore is 4. GCS verbal subscore is 4. GCS motor subscore is 6.  Pt alert, oriented only to her name.  Will follow commands, however doesn't fully comply with neuro exam activities. 4/5 grip strength in rt hand, 3/5 grip strength in lt hand with some neglect and left arm pronator drift.   Skin: Skin is warm and dry. No rash noted. She is not diaphoretic.  Psychiatric: She has a normal mood and affect.    ED Course  Procedures (including critical care time) Labs Review Labs Reviewed - No data to display  Imaging Review     DG Chest 2 View (Final result)  Result time: 04/17/14 14:30:27    Final result by Rad Results In Interface (04/17/14 14:30:27)    Narrative:   CLINICAL DATA: Altered mental status.  EXAM:  CHEST 2 VIEW  COMPARISON: None.  FINDINGS: The heart size and mediastinal contours are within normal limits. Both lungs are clear. No pneumothorax or pleural effusion is noted. The visualized skeletal structures are unremarkable.  IMPRESSION: No acute cardiopulmonary abnormality seen.    DG Lumbar Spine Complete (Final result)  Result time: 04/17/14 14:34:50    Final result by Rad Results In Interface (04/17/14 14:34:50)    Narrative:   CLINICAL DATA: Altered mental status.  EXAM: LUMBAR SPINE - COMPLETE 4+ VIEW  COMPARISON: None.  FINDINGS: No fracture is noted. Grade 1  anterolisthesis of L3-4 is noted secondary to posterior facet joint hypertrophy. Moderate degenerative disc disease is noted at this level. Severe degenerative disc disease is noted at L4-5 and L5-S1. Lumbarized S1 vertebral body is noted.  IMPRESSION: Severe multilevel degenerative disc disease is noted. No acute abnormality seen in the lumbar spine.    CT Head Wo Contrast (Final result)  Result time: 04/17/14 14:31:21    Final result by Rad Results In Interface (04/17/14 14:31:21)    Narrative:   CLINICAL DATA: Initial encounter for fall yesterday with possible slurred speech and left arm weakness.  EXAM: CT HEAD WITHOUT CONTRAST  CT CERVICAL SPINE WITHOUT CONTRAST  TECHNIQUE: Multidetector CT imaging of the head and cervical spine was performed following the standard protocol without intravenous contrast. Multiplanar CT image reconstructions of the cervical spine were also generated.  COMPARISON: 09/04/2013.  FINDINGS: CT HEAD FINDINGS  There is no evidence for acute hemorrhage, hydrocephalus, mass lesion, or abnormal extra-axial fluid collection. Patchy low attenuation in the deep hemispheric and periventricular white matter is nonspecific, but likely reflects chronic microvascular ischemic demyelination. Chronic bilateral lacunar infarcts are seen in the basal ganglia. On image the 16, there is a new hypodensity in the left containment which has imaging features compatible with age indeterminate infarct although it does appear new since 09/04/2013. Diffuse loss of parenchymal volume is consistent with atrophy.  The visualized paranasal sinuses and mastoid air cells are clear. No evidence for skull fracture.  CT CERVICAL SPINE FINDINGS  Imaging was obtained from the skullbase through the T1 vertebral body. There is no evidence for an acute fracture. Marked loss of disc height is seen at C2-3. There is loss of disc height with endplate degeneration at C6-7 and  to a lesser degree at C5-6. Trace anterolisthesis of C4-5 is compatible with the degree of facet degeneration at this level. Similar trace anterolisthesis is seen at C5-6, stable. There is diffuse facet osteoarthritis throughout bilaterally. No prevertebral soft tissue swelling.  IMPRESSION: Atrophy with chronic small vessel white matter ischemic disease. There is a new (since 09/04/2013) age indeterminate lacunar infarct in the left basal ganglia.  No cervical spine fracture although diffuse degenerative changes are evident.      EKG Interpretation   Date/Time:  Monday April 17 2014 12:39:27 EDT Ventricular Rate:  71 PR Interval:  150 QRS Duration: 88 QT Interval:  444 QTC Calculation: 482 R Axis:   44 Text Interpretation:  Sinus rhythm Low voltage, extremity leads  Nonspecific T wave abnormality Lateral leads Baseline wander When compared  with ECG of 09/04/2013 No significant change was found Confirmed by [facility]   MD, Nunzio Cory [phone]) on 04/17/2014 1:06:15 PM      MDM   Final diagnoses:  Altered mental status, unspecified altered mental status type   80 yo female presents with reported change in mental status with hx of dementia.  Case discussed with Dr. Thurnell Garbe.  CBG,  CBC, BMP, CK, Troponin, Lactic acid, UA, CT head, CT C-spine, Lumbar spine and CXR.  Labs reviewed and without significant abnormalities.  No acute abnormality noted in the c-spine, l-spine or CXR.  CT head shows lacunar infarct in left basal ganglia.   3:31 PM Internal medicine consulted and will come to admit.  She appears reasonably stabilized for admission considering the current resources, flow, and capabilities available in the ED at this time, and I doubt any other screening and/or treatment required in the ED prior to admission.    Filed Vitals:   04/17/14 1229 04/17/14 1238 04/17/14 1245 04/17/14 1300  BP:  151/97 148/89 154/90  Pulse:  72 75 64  Temp:  97.7 F (36.5 C)    TempSrc:   Oral    Resp:  18  12  SpO2: 96% 98% 96% 97%   Meds given in ED:  Medications - No data to display  Current Discharge Medication List        Britt Bottom, NP 04/19/14 2259

## 2014-04-17 NOTE — ED Notes (Signed)
Per [facility], pt from home, lives with son. Aide came in today and found patient on the ground, pt apparently fell yesterday. Aide states pt was alert but seemed "a little altered". Possible slurred speech. Unsure of LSN, possibly Friday. Also concern for left arm weakness. Initially denied pain from call. SCCA negative. Pt has lumbar pain now, pt states that its chronic. Stable pelvis. Pt states she was trying to walk and then fell last night. Pt has hx of dementia. Unsure of patients baseline. Pt is Alert, talking and laughing. Left pupil slightly larger than right. No obvious injury, denies head pain.

## 2014-04-18 DIAGNOSIS — I359 Nonrheumatic aortic valve disorder, unspecified: Secondary | ICD-10-CM

## 2014-04-18 DIAGNOSIS — I639 Cerebral infarction, unspecified: Secondary | ICD-10-CM

## 2014-04-18 DIAGNOSIS — R000 Tachycardia, unspecified: Secondary | ICD-10-CM

## 2014-04-18 DIAGNOSIS — I10 Essential (primary) hypertension: Secondary | ICD-10-CM

## 2014-04-18 DIAGNOSIS — R531 Weakness: Secondary | ICD-10-CM

## 2014-04-18 DIAGNOSIS — I6300 Cerebral infarction due to thrombosis of unspecified precerebral artery: Secondary | ICD-10-CM

## 2014-04-18 DIAGNOSIS — F0390 Unspecified dementia without behavioral disturbance: Secondary | ICD-10-CM

## 2014-04-18 LAB — HEMOGLOBIN A1C
Hgb A1c MFr Bld: 5.7 % — ABNORMAL HIGH (ref <5.7)
MEAN PLASMA GLUCOSE: 117 mg/dL — AB (ref <117)

## 2014-04-18 LAB — LIPID PANEL
CHOLESTEROL: 227 mg/dL — AB (ref 0–200)
HDL: 40 mg/dL (ref >39)
LDL Cholesterol: 167 mg/dL — ABNORMAL HIGH (ref 0–99)
TRIGLYCERIDES: 102 mg/dL (ref <150)
Total CHOL/HDL Ratio: 5.7 RATIO
VLDL: 20 mg/dL (ref 0–40)

## 2014-04-18 LAB — URINE CULTURE
Colony Count: NO GROWTH
Culture: NO GROWTH

## 2014-04-18 MED ORDER — METOPROLOL TARTRATE 25 MG PO TABS
25.0000 mg | ORAL_TABLET | Freq: Two times a day (BID) | ORAL | Status: DC
  Administered 2014-04-18 – 2014-04-20 (×5): 25 mg via ORAL
  Filled 2014-04-18 (×5): qty 1

## 2014-04-18 MED ORDER — QUETIAPINE FUMARATE 25 MG PO TABS
25.0000 mg | ORAL_TABLET | Freq: Once | ORAL | Status: AC
  Administered 2014-04-18: 25 mg via ORAL
  Filled 2014-04-18: qty 1

## 2014-04-18 MED ORDER — LORAZEPAM 1 MG PO TABS
1.0000 mg | ORAL_TABLET | ORAL | Status: DC | PRN
  Administered 2014-04-19: 1 mg via ORAL
  Filled 2014-04-18: qty 1

## 2014-04-18 NOTE — Progress Notes (Signed)
INITIAL NUTRITION ASSESSMENT  DOCUMENTATION CODES Per approved criteria  -Not Applicable   INTERVENTION: Provide Ensure Complete BID PRN RD to continue to monitor for po adequacy  NUTRITION DIAGNOSIS: Predicted sub optimal energy intake related to dementia and new stroke  as evidenced by patient's chart and reported weight loss.   Goal: Pt to meet >/= 90% of their estimated nutrition needs   Monitor:  Diet advancement, PO intake, weight trend, labs  Reason for Assessment: Malnutrition Screening Tool (MST)  80 y.o. female  Admitting Dx: CVA  ASSESSMENT: 80 year old woman with dementia and HTN who presents with L sided weakness. Head CT notable for new (since 08/2013) age indeterminate lacunar infarct in the L basal ganglia. Passed swallow screen yesterday.  Pt asleep at time of visit; awoke but did not respond. Pt's diet was advanced early this AM. Per MST report filed in chart pt reported weight loss and eating poorly due to decreased appetite upon admission. No weight history on file.  No known PO intake since admission. RD to continue to monitor for PO adequacy.   Labs: high cholesterol, decreased GFR  Height: Ht Readings from Last 1 Encounters:  No data found for Ht  Estimated 5'5" by this RD  Weight: Wt Readings from Last 1 Encounters:  No data found for Wt  143 lbs per bed scale on 10/20  Ideal Body Weight: unknown  % Ideal Body Weight: NA  Wt Readings from Last 10 Encounters:  No data found for Wt    Usual Body Weight: unknown  % Usual Body Weight: NA  BMI:  There is no height or weight on file to calculate BMI.  Estimated Nutritional Needs: Kcal: 1550-1800 Protein: 70-80 grams Fluid: 1.8 L/day  Skin: intact  Diet Order: General  EDUCATION NEEDS: -No education needs identified at this time  No intake or output data in the 24 hours ending 04/18/14 1019  Last BM: PTA   Labs:   Recent Labs Lab 04/17/14 1313  NA 140  K 3.7  CL 101   CO2 24  BUN 20  CREATININE 1.23*  CALCIUM 9.5  GLUCOSE 98    CBG (last 3)   Recent Labs  04/17/14 1338  GLUCAP 90    Scheduled Meds: . aspirin  81 mg Oral Daily  . heparin  5,000 Units Subcutaneous 3 times per day    Continuous Infusions:   Past Medical History  Diagnosis Date  . Dementia   . Dementia   . Hypertension     History reviewed. No pertinent past surgical history.  Pryor Ochoa RD, LDN Inpatient Clinical Dietitian Pager: [phone] After Hours Pager: [phone]

## 2014-04-18 NOTE — Progress Notes (Signed)
IV team contacted to place peripheral IV.

## 2014-04-18 NOTE — Progress Notes (Signed)
UR complete.  Khilynn Borntreger RN, MSN 

## 2014-04-18 NOTE — Progress Notes (Signed)
Echo Lab  2D Echocardiogram completed.  [place], RDCS 04/18/2014 3:01 PM

## 2014-04-18 NOTE — Progress Notes (Signed)
*  PRELIMINARY RESULTS* Vascular Ultrasound Carotid Duplex (Doppler) has been completed.   Findings suggest 1-39% internal carotid artery stenosis bilaterally. Vertebral arteries are patent with antegrade flow.  04/18/2014 12:46 PM Maudry Mayhew, RVT, RDCS, RDMS

## 2014-04-18 NOTE — Progress Notes (Signed)
Pt seen and examined with Dr. Ethelene Hal. Please refer to resident note for details.   In brief, Pt is a 80 y/o female with PMH of dementia and HTN who presents with new onset L sided weakness. Pt is unable to provide a good history secondary to dementia. Per chart pt was at her baseline at approx 10 AM and at approx 11 AM she was noted to have worsening confusion and weakness and numbness in her left hand and leg. Pt also noted to have slurred speech. Pt was brought to Ed for further eval. Pt, at current time, denies any complaints except for mild L UE weakness. Remaining ROS negative  Exam: Gen: AAO*2, NAD CV: RRR, normal heart sounds Lungs: CTA b/l Abd: soft, non tender. BS + Ext: no edema Neuro: CN II- XII intact, sensation intact, Power 5/5 b/l LE and RUE with mild weakness in L UE  Assessment and Plan: 80 y/o female with acute L sided weakness and dysarthria possibly CVA   Left sided weakness: - Likely CVA - Neuro f/u noted- f/u MRI - c/w asa - carotid dopplers noted. CT head with lacunar infarct (age indeterminate) but would note explain patient's symptoms - Pt will likely need SNF on d/c - Would start a statin AM given elevated LDL and possible CVA  Tachycardia: - Pt with episode of HR of 140s and possible afib on monitor - Restarted beta blocker. Will consider a/c but pt is likely a poor candidate given fall risk - restarted beta blocker

## 2014-04-18 NOTE — Evaluation (Signed)
Physical Therapy Evaluation Patient Details Name: Cindy Trevino MRN: [id number] DOB: 1924/12/25 Today's Date: 04/18/2014   History of Present Illness  80 y.o. female with hx of dementia and HTN. Pt admitted for Lt side weakness, slurred speech, and AMS after possibly experiencing a fall. CT showed a new (since 08/2013) lacunar infarct in the Lt basal ganglia, but would not explain symptoms. MRI has been ordered.  Clinical Impression  Pt admitted with/for s/s of stroke that don't correspond to CT showing L BG infarct.  Pt currently limited functionally due to the problems listed. ( See problems list.)   Pt will benefit from PT to maximize function and safety in order to get ready for next venue listed below.     Follow Up Recommendations SNF    Equipment Recommendations  None recommended by PT    Recommendations for Other Services       Precautions / Restrictions Precautions Precautions: Fall Restrictions Weight Bearing Restrictions: No      Mobility  Bed Mobility Overal bed mobility: Needs Assistance Bed Mobility: Supine to Sit;Sit to Supine     Supine to sit: Mod assist;HOB elevated Sit to supine: Max assist   General bed mobility comments: Pt intiated movement to EOB, but lost focus every 5 secs and needed to be consistently redirected.  Transfers Overall transfer level: Needs assistance Equipment used: 1 person hand held assist Transfers: Sit to/from Stand Sit to Stand: Total assist         General transfer comment: Pt required total(A) to stand and was unable to maintain upright position. Unclear as to whether this is because of weakness or cognitive deficits (dementia).   Ambulation/Gait             General Gait Details: pt not able today  Stairs            Wheelchair Mobility    Modified Rankin (Stroke Patients Only) Modified Rankin (Stroke Patients Only) Pre-Morbid Rankin Score:  (not sure of prior level of function) Modified Rankin:  Severe disability     Balance Overall balance assessment: Needs assistance Sitting-balance support: Feet supported;Single extremity supported Sitting balance-Leahy Scale: Poor     Standing balance support: Bilateral upper extremity supported;Single extremity supported Standing balance-Leahy Scale: Poor                               Pertinent Vitals/Pain Pain Assessment: No/denies pain    Home Living                   Additional Comments: Family unavailable to obtain home information    Prior Function           Comments: Pt is a poor historian and family unavailable to determine prior level of function     Hand Dominance        Extremity/Trunk Assessment               Lower Extremity Assessment: Generalized weakness;Difficult to assess due to impaired cognition      Cervical / Trunk Assessment: Kyphotic  Communication   Communication: Other (comment) (can be understood when she is focused to answer)  Cognition Arousal/Alertness: Awake/alert Behavior During Therapy: Restless Overall Cognitive Status: Impaired/Different from baseline Area of Impairment: Orientation;Attention;Memory;Following commands;Safety/judgement;Awareness;Problem solving Orientation Level: Situation;Time;Place Current Attention Level: Focused Memory: Decreased short-term memory Following Commands: Follows one step commands inconsistently Safety/Judgement: Decreased awareness of safety;Decreased awareness of deficits Awareness:  (Emerging intellectual) Problem  Solving: Slow processing;Requires verbal cues;Requires tactile cues General Comments: Pt has hx of dementia. Family unavailable during session to determine if current presentation is pt's baseline. Pt was awake and responding to questions at beginning of session. While pt was seated EOB she became restless, but not agitated as in am.  Pt was difficult to redirect.    General Comments      Exercises         Assessment/Plan    PT Assessment Patient needs continued PT services  PT Diagnosis Generalized weakness   PT Problem List Decreased strength;Decreased activity tolerance;Decreased balance;Decreased mobility;Decreased coordination;Decreased safety awareness  PT Treatment Interventions Gait training;DME instruction;Functional mobility training;Therapeutic activities;Balance training;Patient/family education   PT Goals (Current goals can be found in the Care Plan section) Acute Rehab PT Goals Patient Stated Goal: none stated PT Goal Formulation: Patient unable to participate in goal setting Time For Goal Achievement: 04/25/14 Potential to Achieve Goals: Fair    Frequency Min 3X/week   Barriers to discharge        Co-evaluation               End of Session   Activity Tolerance: Patient tolerated treatment well;Other (comment) (limited by inablilty to focus on task) Patient left: in bed;with bed alarm set Nurse Communication: Mobility status         Time: 9528-4132 PT Time Calculation (min): 23 min   Charges:   PT Evaluation $Initial PT Evaluation Tier I: 1 Procedure PT Treatments $Therapeutic Activity: 8-22 mins   PT G Codes:          Koda Defrank, Tessie Fass 04/18/2014, 4:48 PM 04/18/2014  Donnella Sham, PT [phone]  (pager)

## 2014-04-18 NOTE — Progress Notes (Signed)
EKG showing NSR. Printed several strips from continual monitor for MD to interpret. MD notified and will review.

## 2014-04-18 NOTE — Progress Notes (Signed)
  I have seen and examined the patient myself, and I have reviewed the note by Andee Poles Day, MS 3 and was present during the interview and physical exam.  Please see my separate H&P for additional findings, assessment, and plan.   Signed: Kelby Aline, MD 04/18/2014, 2:12 PM

## 2014-04-18 NOTE — Progress Notes (Signed)
Subjective: No acute events overnight.  Objective: Vital signs in last 24 hours: Filed Vitals:   04/18/14 0217 04/18/14 0228 04/18/14 0550 04/18/14 0900  BP: 173/90  142/87 106/66  Pulse: 58  77   Temp: 97.6 F (36.4 C) 97.6 F (36.4 C) 97.6 F (36.4 C)   TempSrc: Oral  Oral   Resp: 18  18   SpO2: 96%  97%    Weight change:  No intake or output data in the 24 hours ending 04/18/14 1031  General Appearance: Alert and oriented to person and place but not time or reason for being here , pleasant, attempts to cooperate, no distress, appears stated age  100: Normocephalic, without obvious abnormality, atraumatic, PERRL, EOMI, MMM  Neck: No carotid bruit or JVD  Back: Kyphotic, no CVA tenderness  Lungs: Clear to auscultation bilaterally, respirations unlabored but very poor effort  Heart: Regular rate and rhythm, S1 and S2 normal, 2/6 early systolic murmur Abdomen: Soft, non-tender, bowel sounds active all four quadrants, no masses, no organomegaly  Extremities: Extremities normal, atraumatic, no cyanosis or edema  Pulses: 2+ and symmetric all extremities  Neurologic: CNII-XII intact, question of effort on strength tests but R ULE and LE 5/5, question of weaker LUE and LLE but still 5/5   Lab Results: Lab  04/17/14 1313   NA  140   K  3.7   CL  101   CO2  24   GLUCOSE  98   BUN  20   CREATININE  1.23*   CALCIUM  9.5   CBC:   Recent Labs  Lab  04/17/14 1313   WBC  6.6   NEUTROABS  4.7   HGB  13.6   HCT  40.4   MCV  88.0   PLT  241    Cardiac Enzymes:   Recent Labs  Lab  04/17/14 1313   CKTOTAL  65   TROPONINI  <0.30    CBG:   Recent Labs  Lab  04/17/14 1338   GLUCAP  90    Hemoglobin A1C:  No results found for this basename: HGBA1C, in the last 168 hours  Fasting Lipid Panel:   Recent Labs  Lab  04/18/14 0710   CHOL  227*   HDL  40   LDLCALC  167*   TRIG  102   CHOLHDL  5.7   Urinalysis:   Recent Labs  Lab  04/17/14 1340   COLORURINE   YELLOW   LABSPEC  1.015   PHURINE  6.0   GLUCOSEU  NEGATIVE   HGBUR  NEGATIVE   BILIRUBINUR  NEGATIVE   KETONESUR  NEGATIVE   PROTEINUR  NEGATIVE   UROBILINOGEN  0.2   NITRITE  NEGATIVE   LEUKOCYTESUR  NEGATIVE     Micro Results: No results found for this or any previous visit (from the past 240 hour(s)). Studies/Results: Dg Chest 2 View  04/17/2014   CLINICAL DATA:  Altered mental status.  EXAM: CHEST  2 VIEW  COMPARISON:  None.  FINDINGS: The heart size and mediastinal contours are within normal limits. Both lungs are clear. No pneumothorax or pleural effusion is noted. The visualized skeletal structures are unremarkable.  IMPRESSION: No acute cardiopulmonary abnormality seen.   Electronically Signed   By: Sabino Dick M.D.   On: 04/17/2014 14:30   Dg Lumbar Spine Complete  04/17/2014   CLINICAL DATA:  Altered mental status.  EXAM: LUMBAR SPINE - COMPLETE 4+ VIEW  COMPARISON:  None.  FINDINGS: No fracture is noted. Grade 1 anterolisthesis of L3-4 is noted secondary to posterior facet joint hypertrophy. Moderate degenerative disc disease is noted at this level. Severe degenerative disc disease is noted at L4-5 and L5-S1. Lumbarized S1 vertebral body is noted.  IMPRESSION: Severe multilevel degenerative disc disease is noted. No acute abnormality seen in the lumbar spine.   Electronically Signed   By: Sabino Dick M.D.   On: 04/17/2014 14:34   Ct Head Wo Contrast  04/17/2014   CLINICAL DATA:  Initial encounter for fall yesterday with possible slurred speech and left arm weakness.  EXAM: CT HEAD WITHOUT CONTRAST  CT CERVICAL SPINE WITHOUT CONTRAST  TECHNIQUE: Multidetector CT imaging of the head and cervical spine was performed following the standard protocol without intravenous contrast. Multiplanar CT image reconstructions of the cervical spine were also generated.  COMPARISON:  09/04/2013.  FINDINGS: CT HEAD FINDINGS  There is no evidence for acute hemorrhage, hydrocephalus, mass lesion,  or abnormal extra-axial fluid collection. Patchy low attenuation in the deep hemispheric and periventricular white matter is nonspecific, but likely reflects chronic microvascular ischemic demyelination. Chronic bilateral lacunar infarcts are seen in the basal ganglia. On image the 16, there is a new hypodensity in the left containment which has imaging features compatible with age indeterminate infarct although it does appear new since 09/04/2013. Diffuse loss of parenchymal volume is consistent with atrophy.  The visualized paranasal sinuses and mastoid air cells are clear. No evidence for skull fracture.  CT CERVICAL SPINE FINDINGS  Imaging was obtained from the skullbase through the T1 vertebral body. There is no evidence for an acute fracture. Marked loss of disc height is seen at C2-3. There is loss of disc height with endplate degeneration at C6-7 and to a lesser degree at C5-6. Trace anterolisthesis of C4-5 is compatible with the degree of facet degeneration at this level. Similar trace anterolisthesis is seen at C5-6, stable. There is diffuse facet osteoarthritis throughout bilaterally. No prevertebral soft tissue swelling.  IMPRESSION: Atrophy with chronic small vessel white matter ischemic disease. There is a new (since 09/04/2013) age indeterminate lacunar infarct in the left basal ganglia.  No cervical spine fracture although diffuse degenerative changes are evident.   Electronically Signed   By: Misty Stanley M.D.   On: 04/17/2014 14:31   Ct Cervical Spine Wo Contrast  04/17/2014   CLINICAL DATA:  Initial encounter for fall yesterday with possible slurred speech and left arm weakness.  EXAM: CT HEAD WITHOUT CONTRAST  CT CERVICAL SPINE WITHOUT CONTRAST  TECHNIQUE: Multidetector CT imaging of the head and cervical spine was performed following the standard protocol without intravenous contrast. Multiplanar CT image reconstructions of the cervical spine were also generated.  COMPARISON:  09/04/2013.   FINDINGS: CT HEAD FINDINGS  There is no evidence for acute hemorrhage, hydrocephalus, mass lesion, or abnormal extra-axial fluid collection. Patchy low attenuation in the deep hemispheric and periventricular white matter is nonspecific, but likely reflects chronic microvascular ischemic demyelination. Chronic bilateral lacunar infarcts are seen in the basal ganglia. On image the 16, there is a new hypodensity in the left containment which has imaging features compatible with age indeterminate infarct although it does appear new since 09/04/2013. Diffuse loss of parenchymal volume is consistent with atrophy.  The visualized paranasal sinuses and mastoid air cells are clear. No evidence for skull fracture.  CT CERVICAL SPINE FINDINGS  Imaging was obtained from the skullbase through the T1 vertebral body. There is no evidence for an acute fracture.  Marked loss of disc height is seen at C2-3. There is loss of disc height with endplate degeneration at C6-7 and to a lesser degree at C5-6. Trace anterolisthesis of C4-5 is compatible with the degree of facet degeneration at this level. Similar trace anterolisthesis is seen at C5-6, stable. There is diffuse facet osteoarthritis throughout bilaterally. No prevertebral soft tissue swelling.  IMPRESSION: Atrophy with chronic small vessel white matter ischemic disease. There is a new (since 09/04/2013) age indeterminate lacunar infarct in the left basal ganglia.  No cervical spine fracture although diffuse degenerative changes are evident.   Electronically Signed   By: Misty Stanley M.D.   On: 04/17/2014 14:31   Medications:  Scheduled Meds: . aspirin  81 mg Oral Daily  . heparin  5,000 Units Subcutaneous 3 times per [place] Vo   Continuous Infusions:  PRN Meds:.  Assessment/Plan: Cindy Trevino is an 80 year old female with a PMH of HTN and dementia who was admitted for confusion, slurred speech and new left hand weakness on 04/17/14.  Active Problems:   CVA (cerebral  infarction)  1. Left sided weakness: Poor historian but concern for L sided weakness and slurred speech. Potential left upper and lower extremity weakness on exam, but difficult to assess due to poor effort 2/2 dementia. Head CT compared to earlier CT in 08/2013 was notable for new lacunar infart in L basal ganglia, however this would not cause her left sided weakness. Neurology was consulted yesterday and recommend a full work up.  - Passed swallow screen yesterday - Neurology following - Cardiology monitoring - Echo to rule out potential clotting, PFO, or endocarditis - Carotid doppler to assess for carotid stenosis - ASA 81 daily starting tomorrow   -PT/OT   2. HTN: BP 100s-170s/60s-90s. On metoprolol 25 mg daily and lisinopril 10 mg daily.  -hold BP meds for 24 hr in setting of possible cerebrovascular event  -restart home meds if bp rises to 200s/110s  3. Dementia: Patient alert and oriented to person and place but not date or why she is here. [name] says she is at baseline. Likely secondary to chronic microvascular ischemic demyelination and chronic bilateral lacunar infarcts on head CT.  -Neuro following  4. Diet: NPO   5. PPx: heparin 5000 u [place]  6. Dispo: 4N  This is a Careers information officer Note.  The care of the patient was discussed with Dr. Dareen Piano and the assessment and plan formulated with their assistance.  Please see their attached note for official documentation of the daily encounter.   LOS: 1 Cindy Trevino   Sherie Don [place], Med Student 04/18/2014, 10:31 AM

## 2014-04-18 NOTE — H&P (Signed)
Pt seen and examined with Dr. Ethelene Hal. I agree with documentation as outlined in his note. Please refer to my progress note from today for further details

## 2014-04-18 NOTE — Progress Notes (Signed)
Occupational Therapy Evaluation Patient Details Name: Cindy Trevino MRN: [id number] DOB: [birth year] Today's Date: 04/18/2014    History of Present Illness 80 y.o. female with hx of dementia and HTN. Pt admitted for Lt side weakness, slurred speech, and AMS after possibly experiencing a fall. CT showed a new (since 08/2013) lacunar infarct in the Lt basal ganglia, but would not explain symptoms. MRI has been ordered.   Clinical Impression   Pt admitted with possible R CVA resulting in left side weakness, slurred speech and AMS after a possible fall. Pt has dementia and lives at home with son and home aide found pt on floor at home. Pt currently with functional limitiations due to the deficits listed below (see OT problem list). Pt was alert and responding to questions at start of session, but after sitting EOB for a few minutes pt became restless, agitated, had increased slurred speech, and was unable to respond to or follow commands. Pt unable to complete sit to stand transfer from EOB, but unclear if because of weakness or cognitive deficits. Pt will benefit from skilled OT to increase independence and safety with adls and balance to allow discharge to SNF or home. Will discuss plans for d/c when family is available.     Follow Up Recommendations  SNF;Supervision/Assistance - 24 hour    Equipment Recommendations  Other (comment) (Unsure of what pt/family has at home; will follow up)    Recommendations for Other Services       Precautions / Restrictions Precautions Precautions: Fall Restrictions Weight Bearing Restrictions: No      Mobility Bed Mobility Overal bed mobility: Needs Assistance Bed Mobility: Supine to Sit;Sit to Supine;Rolling Rolling: Max assist   Supine to sit: Mod assist;HOB elevated Sit to supine: Max assist;+2 for physical assistance   General bed mobility comments: Pt intiated movement to EOB and required mod (A) for lateral leans/scoot. Pt required max +2  (A) sit<>supine and rolling because of inability to follow commands and restlessness/agitation.   Transfers Overall transfer level: Needs assistance Equipment used: 2 person hand held assist Transfers: Sit to/from Stand Sit to Stand: Total assist;+2 physical assistance         General transfer comment: Pt required total +2 (A) to stand and was unable to maintain upright position. Unclear as to whether this is because of weakness or cognitive deficits (dementia).     Balance Overall balance assessment: Needs assistance Sitting-balance support: Feet supported;Bilateral upper extremity supported Sitting balance-Leahy Scale: Poor         Standing balance comment: Unable to reach standing position to assess balance                            ADL Overall ADL's : Needs assistance/impaired                                       General ADL Comments: Pt was awake and responding to simple questions at start of session. Once asked to move to EOB, pt began to have incr slurred speech, became restless and agitated. Pt demonstrates Lt side weakness and Lt inattention. Pt grabs and pushes with Rt hand and requires manual assist to release grasp due to inability to follow commands.     Vision  Perception     Praxis      Pertinent Vitals/Pain Pain Assessment: No/denies pain     Hand Dominance     Extremity/Trunk Assessment Upper Extremity Assessment Upper Extremity Assessment: Difficult to assess due to impaired cognition;LUE deficits/detail LUE Deficits / Details: Decr grip strength, decr ROM, decr intiation of movement, and left inattention. Some activation in shoulder with assist in initiating movement. LUE Coordination: decreased fine motor;decreased gross motor   Lower Extremity Assessment Lower Extremity Assessment: Defer to PT evaluation   Cervical / Trunk Assessment Cervical / Trunk Assessment: Kyphotic    Communication     Cognition Arousal/Alertness: Awake/alert Behavior During Therapy: Restless;Agitated Overall Cognitive Status: Impaired/Different from baseline Area of Impairment: Orientation;Attention;Memory;Following commands;Safety/judgement;Awareness;Problem solving   Current Attention Level: Focused Memory: Decreased short-term memory Following Commands: Follows one step commands inconsistently Safety/Judgement: Decreased awareness of safety;Decreased awareness of deficits Awareness:  (Emerging intellectual) Problem Solving: Slow processing;Requires verbal cues;Requires tactile cues General Comments: Pt has hx of dementia. Family unavailable during session to determine if current presentation is pt's baseline. Pt was awake and responding to questions at beginning of session. While pt was seated EOB she became restless and agitated. Pt had increased slurred speech and was unable to follow commands or repsond to questions.   General Comments       Exercises       Shoulder Instructions      Home Living                                   Additional Comments: Family unavailable to obtain home information      Prior Functioning/Environment          Comments: Pt is a poor historian and family unavailable to determine prior level of function    OT Diagnosis: Cognitive deficits   OT Problem List: Decreased strength;Decreased range of motion;Decreased activity tolerance;Impaired balance (sitting and/or standing);Decreased cognition;Decreased safety awareness   OT Treatment/Interventions:      OT Goals(Current goals can be found in the care plan section) Acute Rehab OT Goals Patient Stated Goal: none stated Time For Goal Achievement: 05/02/14 Potential to Achieve Goals: Fair  OT Frequency:     Barriers to D/C:            Co-evaluation              End of Session Equipment Utilized During Treatment: Gait belt Nurse Communication: Mobility  status;Precautions;Other (comment) (Restless and agitated, voided bladder and cleaned)  Activity Tolerance: Treatment limited secondary to agitation Patient left: in bed;with call bell/phone within reach;with bed alarm set   Time: 4665-9935 OT Time Calculation (min): 42 min Charges:    G-Codes:    Redmond Baseman 05/03/14, 1:09 PM

## 2014-04-18 NOTE — Progress Notes (Signed)
Telemetry notifying that pt HR frequently into 140s+. Review of strip shows a possible paroxysmal rapid afib. Charge nurse notified and resident notified.

## 2014-04-18 NOTE — Progress Notes (Signed)
Charges : eval and 2 self care  I agree with the following treatment note after reviewing documentation.   Jeri Modena OTR/L Pager: [phone] Office: [phone] .

## 2014-04-18 NOTE — Progress Notes (Signed)
SLP Cancellation Note  Patient Details Name: Cindy Trevino MRN: [id number] DOB: 13-Jun-1925   Cancelled treatment:       Reason Eval/Treat Not Completed: Other (comment). Pts chart screened by SLP. Given nature of order and pt dx, SLP will complete evaluation/treatment as soon as possible, which may be within 24-48 hours.   Herbie [place], [place] CCC-SLP [phone]  Lynann Beaver 04/18/2014, 3:05 PM

## 2014-04-18 NOTE — Progress Notes (Signed)
Subjective: NAEON. Patient seen by neurology who recommended full stroke work-up and ASA. Patient interviewed this morning without family members available and is demented/poor historian. She had no complaints and did not really understand ROS questions about numbness or weakness.  Objective: Vital signs in last 24 hours: Filed Vitals:   04/18/14 0217 04/18/14 0228 04/18/14 0550 04/18/14 0900  BP: 173/90  142/87 106/66  Pulse: 58  77   Temp: 97.6 F (36.4 C) 97.6 F (36.4 C) 97.6 F (36.4 C)   TempSrc: Oral  Oral   Resp: 18  18   SpO2: 96%  97%    Weight change:  No intake or output data in the 24 hours ending 04/18/14 1047  General Appearance: Alert and oriented to person and place but not time or reason for being here , pleasant, attempts to cooperate, no distress, appears stated age  80: Normocephalic, without obvious abnormality, atraumatic, PERRL, EOMI, MMM  Neck: No carotid bruit or JVD  Back: Kyphotic, no CVA tenderness  Lungs: Clear to auscultation bilaterally, respirations unlabored but very poor effort Heart: Regular rate and rhythm, S1 and S2 normal, no murmur, rub or gallop  Abdomen: Soft, non-tender, bowel sounds active all four quadrants, no masses, no organomegaly  Extremities: Extremities normal, atraumatic, no cyanosis or edema  Pulses: 2+ and symmetric all extremities  Neurologic: CNII-XII intact, question of effort on strength tests but R ULE and LE 5/5, question of weaker LUE and LLE but still 5/5    Lab Results: Basic Metabolic Panel:  Recent Labs Lab 04/17/14 1313  NA 140  K 3.7  CL 101  CO2 24  GLUCOSE 98  BUN 20  CREATININE 1.23*  CALCIUM 9.5  CBC:  Recent Labs Lab 04/17/14 1313  WBC 6.6  NEUTROABS 4.7  HGB 13.6  HCT 40.4  MCV 88.0  PLT 241   Cardiac Enzymes:  Recent Labs Lab 04/17/14 1313  CKTOTAL 65  TROPONINI <0.30   CBG:  Recent Labs Lab 04/17/14 1338  GLUCAP 90   Hemoglobin A1C: No results found for this  basename: HGBA1C,  in the last 168 hours Fasting Lipid Panel:  Recent Labs Lab 04/18/14 0710  CHOL 227*  HDL 40  LDLCALC 167*  TRIG 102  CHOLHDL 5.7  Urinalysis:  Recent Labs Lab 04/17/14 1340  COLORURINE YELLOW  LABSPEC 1.015  PHURINE 6.0  GLUCOSEU NEGATIVE  HGBUR NEGATIVE  BILIRUBINUR NEGATIVE  KETONESUR NEGATIVE  PROTEINUR NEGATIVE  UROBILINOGEN 0.2  NITRITE NEGATIVE  LEUKOCYTESUR NEGATIVE   Misc. Labs: none  Studies/Results: Dg Chest 2 View  04/17/2014   CLINICAL DATA:  Altered mental status.  EXAM: CHEST  2 VIEW  COMPARISON:  None.  FINDINGS: The heart size and mediastinal contours are within normal limits. Both lungs are clear. No pneumothorax or pleural effusion is noted. The visualized skeletal structures are unremarkable.  IMPRESSION: No acute cardiopulmonary abnormality seen.   Electronically Signed   By: Sabino Dick M.D.   On: 04/17/2014 14:30   Dg Lumbar Spine Complete  04/17/2014   CLINICAL DATA:  Altered mental status.  EXAM: LUMBAR SPINE - COMPLETE 4+ VIEW  COMPARISON:  None.  FINDINGS: No fracture is noted. Grade 1 anterolisthesis of L3-4 is noted secondary to posterior facet joint hypertrophy. Moderate degenerative disc disease is noted at this level. Severe degenerative disc disease is noted at L4-5 and L5-S1. Lumbarized S1 vertebral body is noted.  IMPRESSION: Severe multilevel degenerative disc disease is noted. No acute abnormality seen in the  lumbar spine.   Electronically Signed   By: Sabino Dick M.D.   On: 04/17/2014 14:34   Ct Head Wo Contrast  04/17/2014   CLINICAL DATA:  Initial encounter for fall yesterday with possible slurred speech and left arm weakness.  EXAM: CT HEAD WITHOUT CONTRAST  CT CERVICAL SPINE WITHOUT CONTRAST  TECHNIQUE: Multidetector CT imaging of the head and cervical spine was performed following the standard protocol without intravenous contrast. Multiplanar CT image reconstructions of the cervical spine were also generated.   COMPARISON:  09/04/2013.  FINDINGS: CT HEAD FINDINGS  There is no evidence for acute hemorrhage, hydrocephalus, mass lesion, or abnormal extra-axial fluid collection. Patchy low attenuation in the deep hemispheric and periventricular white matter is nonspecific, but likely reflects chronic microvascular ischemic demyelination. Chronic bilateral lacunar infarcts are seen in the basal ganglia. On image the 16, there is a new hypodensity in the left containment which has imaging features compatible with age indeterminate infarct although it does appear new since 09/04/2013. Diffuse loss of parenchymal volume is consistent with atrophy.  The visualized paranasal sinuses and mastoid air cells are clear. No evidence for skull fracture.  CT CERVICAL SPINE FINDINGS  Imaging was obtained from the skullbase through the T1 vertebral body. There is no evidence for an acute fracture. Marked loss of disc height is seen at C2-3. There is loss of disc height with endplate degeneration at C6-7 and to a lesser degree at C5-6. Trace anterolisthesis of C4-5 is compatible with the degree of facet degeneration at this level. Similar trace anterolisthesis is seen at C5-6, stable. There is diffuse facet osteoarthritis throughout bilaterally. No prevertebral soft tissue swelling.  IMPRESSION: Atrophy with chronic small vessel white matter ischemic disease. There is a new (since 09/04/2013) age indeterminate lacunar infarct in the left basal ganglia.  No cervical spine fracture although diffuse degenerative changes are evident.   Electronically Signed   By: Misty Stanley M.D.   On: 04/17/2014 14:31   Ct Cervical Spine Wo Contrast  04/17/2014   CLINICAL DATA:  Initial encounter for fall yesterday with possible slurred speech and left arm weakness.  EXAM: CT HEAD WITHOUT CONTRAST  CT CERVICAL SPINE WITHOUT CONTRAST  TECHNIQUE: Multidetector CT imaging of the head and cervical spine was performed following the standard protocol without  intravenous contrast. Multiplanar CT image reconstructions of the cervical spine were also generated.  COMPARISON:  09/04/2013.  FINDINGS: CT HEAD FINDINGS  There is no evidence for acute hemorrhage, hydrocephalus, mass lesion, or abnormal extra-axial fluid collection. Patchy low attenuation in the deep hemispheric and periventricular white matter is nonspecific, but likely reflects chronic microvascular ischemic demyelination. Chronic bilateral lacunar infarcts are seen in the basal ganglia. On image the 16, there is a new hypodensity in the left containment which has imaging features compatible with age indeterminate infarct although it does appear new since 09/04/2013. Diffuse loss of parenchymal volume is consistent with atrophy.  The visualized paranasal sinuses and mastoid air cells are clear. No evidence for skull fracture.  CT CERVICAL SPINE FINDINGS  Imaging was obtained from the skullbase through the T1 vertebral body. There is no evidence for an acute fracture. Marked loss of disc height is seen at C2-3. There is loss of disc height with endplate degeneration at C6-7 and to a lesser degree at C5-6. Trace anterolisthesis of C4-5 is compatible with the degree of facet degeneration at this level. Similar trace anterolisthesis is seen at C5-6, stable. There is diffuse facet osteoarthritis throughout bilaterally. No  prevertebral soft tissue swelling.  IMPRESSION: Atrophy with chronic small vessel white matter ischemic disease. There is a new (since 09/04/2013) age indeterminate lacunar infarct in the left basal ganglia.  No cervical spine fracture although diffuse degenerative changes are evident.   Electronically Signed   By: Misty Stanley M.D.   On: 04/17/2014 14:31   Medications: I have reviewed the patient's current medications. Scheduled Meds: . aspirin  81 mg Oral Daily  . heparin  5,000 Units Subcutaneous 3 times per day   Continuous Infusions:  PRN Meds:. Assessment/Plan: Active Problems:    CVA (cerebral infarction)  #Left-sided Weakness: Patient seen by neuro last evening who recommended full stroke work-up. History unclear but concern for L sided weakness and possible slurred speech. Questionable left sided upper and lower extremity weakness on exam but poor effort as demented. Per grandson, no known history of previous CVA. Head CT notable for new (since 08/2013) age indeterminate lacunar infarct in the L basal ganglia. However, this would not explain L sided weakness. She may have new R cortical lesion not detected on CT. Passed swallow screen yesterday. -appreciate neurology consult  -Brain MRI/MRA -echo -carotid doppler - ASA 81 daily starting tomorrow  -f/u HgbA1c -cardiac monitoring  -PT/OT   #HTN: BP 100s-170s/60s-90s. On metoprolol 25 mg daily and lisinopril 10 mg daily.  -hold BP meds for 24 hr in setting of possible cerebrovascular event   #Dementia: Patient alert and oriented to person and place but not date or why she is here. [name] says she is at baseline. Likely secondary to chronic microvascular ischemic demyelination and chronic bilateral lacunar infarcts on head CT.  -Appreciate neuro consult   #Diet: NPO   #VTE PPX: heparin 5000 u [place]  Dispo: Disposition is deferred at this time, awaiting improvement of current medical problems.  Anticipated discharge in approximately 1 day(s).   The patient does not know have a current PCP (No primary provider on file.) and does need an [facility] hospital follow-up appointment after discharge.  The patient does not know have transportation limitations that hinder transportation to clinic appointments.  .Services Needed at time of discharge: Y = Yes, Blank = No PT:   OT:   RN:   Equipment:   Other:     LOS: 1 day   Kelby Aline, MD 04/18/2014, 10:47 AM

## 2014-04-18 NOTE — Progress Notes (Signed)
04/18/14 0028  Vitals  Temp 97.8 F (36.6 C)  Temp Source Oral  BP ! 189/80 mmHg  BP Location Right arm  BP Method Automatic  Patient Position (if appropriate) Lying  Pulse Rate ! 59  Pulse Rate Source Monitor  Resp 18  Patient is increasingly agitated. Patient just pulled her IV and cardiac monitoring electrodes and wires off. She stated she didn't want or need them. Physician paged. Will continue to monitor.

## 2014-04-18 NOTE — Progress Notes (Signed)
STROKE TEAM PROGRESS NOTE   HISTORY Cindy Trevino is an 80 y.o. female with a past medical history significant for HTN and dementia, brought in by EMS for further evaluation of dysarthria and left-hand weakness. Cindy Trevino is a the bedside and said that patient's home aide found her on the floor and she was noted to be " little altered" with spurred speech and left hand weakness around 10:30 or 11 am today. Patient's grandson said that she is also noticing some droopiness of the left corner of her mouth. Cindy Trevino said that " I just can not use the left hand".Unsure of LSN, possibly Friday. Denies HA, vertigo, double vision, difficulty swallowing, or visual disturbances. CT brain revealed " a new (since 09/04/2013) age indeterminate lacunar infarct in the left basal ganglia". Patient was not administered TPA secondary to delay in arrival. She was admitted for further evaluation and treatment.   SUBJECTIVE (INTERVAL HISTORY) No family at the bedside. Patient is a poor historian and cannot tell me why she is in the hospital. Her speech   is slightly slurred  OBJECTIVE Temp:  [97.6 F (36.4 C)-97.9 F (36.6 C)] 97.9 F (36.6 C) (10/20 1351) Pulse Rate:  [58-89] 72 (10/20 1351) Cardiac Rhythm:  [-] Normal sinus rhythm (10/20 1205) Resp:  [16-22] 22 (10/20 1351) BP: (106-189)/(66-107) 148/99 mmHg (10/20 1351) SpO2:  [96 %-100 %] 97 % (10/20 1351)   Recent Labs Lab 04/17/14 1338  GLUCAP 90    Recent Labs Lab 04/17/14 1313  NA 140  K 3.7  CL 101  CO2 24  GLUCOSE 98  BUN 20  CREATININE 1.23*  CALCIUM 9.5   No results found for this basename: AST, ALT, ALKPHOS, BILITOT, PROT, ALBUMIN,  in the last 168 hours  Recent Labs Lab 04/17/14 1313  WBC 6.6  NEUTROABS 4.7  HGB 13.6  HCT 40.4  MCV 88.0  PLT 241    Recent Labs Lab 04/17/14 1313  CKTOTAL 65  TROPONINI <0.30   No results found for this basename: LABPROT, INR,  in the last 72 hours  Recent Labs  04/17/14 1340   COLORURINE YELLOW  LABSPEC 1.015  PHURINE 6.0  GLUCOSEU NEGATIVE  HGBUR NEGATIVE  BILIRUBINUR NEGATIVE  KETONESUR NEGATIVE  PROTEINUR NEGATIVE  UROBILINOGEN 0.2  NITRITE NEGATIVE  LEUKOCYTESUR NEGATIVE       Component Value Date/Time   CHOL 227* 04/18/2014 0710   TRIG 102 04/18/2014 0710   HDL 40 04/18/2014 0710   CHOLHDL 5.7 04/18/2014 0710   VLDL 20 04/18/2014 0710   LDLCALC 167* 04/18/2014 0710   No results found for this basename: HGBA1C   No results found for this basename: labopia, cocainscrnur, labbenz, amphetmu, thcu, labbarb    No results found for this basename: ETH,  in the last 168 hours  Dg Chest 2 View  04/17/2014   CLINICAL DATA:  Altered mental status.  EXAM: CHEST  2 VIEW  COMPARISON:  None.  FINDINGS: The heart size and mediastinal contours are within normal limits. Both lungs are clear. No pneumothorax or pleural effusion is noted. The visualized skeletal structures are unremarkable.  IMPRESSION: No acute cardiopulmonary abnormality seen.   Electronically Signed   By: Sabino Dick M.D.   On: 04/17/2014 14:30   Dg Lumbar Spine Complete  04/17/2014   CLINICAL DATA:  Altered mental status.  EXAM: LUMBAR SPINE - COMPLETE 4+ VIEW  COMPARISON:  None.  FINDINGS: No fracture is noted. Grade 1 anterolisthesis of L3-4 is noted secondary to posterior  facet joint hypertrophy. Moderate degenerative disc disease is noted at this level. Severe degenerative disc disease is noted at L4-5 and L5-S1. Lumbarized S1 vertebral body is noted.  IMPRESSION: Severe multilevel degenerative disc disease is noted. No acute abnormality seen in the lumbar spine.   Electronically Signed   By: Sabino Dick M.D.   On: 04/17/2014 14:34   Ct Head Wo Contrast  04/17/2014   CLINICAL DATA:  Initial encounter for fall yesterday with possible slurred speech and left arm weakness.  EXAM: CT HEAD WITHOUT CONTRAST  CT CERVICAL SPINE WITHOUT CONTRAST  TECHNIQUE: Multidetector CT imaging of the head and  cervical spine was performed following the standard protocol without intravenous contrast. Multiplanar CT image reconstructions of the cervical spine were also generated.  COMPARISON:  09/04/2013.  FINDINGS: CT HEAD FINDINGS  There is no evidence for acute hemorrhage, hydrocephalus, mass lesion, or abnormal extra-axial fluid collection. Patchy low attenuation in the deep hemispheric and periventricular white matter is nonspecific, but likely reflects chronic microvascular ischemic demyelination. Chronic bilateral lacunar infarcts are seen in the basal ganglia. On image the 16, there is a new hypodensity in the left containment which has imaging features compatible with age indeterminate infarct although it does appear new since 09/04/2013. Diffuse loss of parenchymal volume is consistent with atrophy.  The visualized paranasal sinuses and mastoid air cells are clear. No evidence for skull fracture.  CT CERVICAL SPINE FINDINGS  Imaging was obtained from the skullbase through the T1 vertebral body. There is no evidence for an acute fracture. Marked loss of disc height is seen at C2-3. There is loss of disc height with endplate degeneration at C6-7 and to a lesser degree at C5-6. Trace anterolisthesis of C4-5 is compatible with the degree of facet degeneration at this level. Similar trace anterolisthesis is seen at C5-6, stable. There is diffuse facet osteoarthritis throughout bilaterally. No prevertebral soft tissue swelling.  IMPRESSION: Atrophy with chronic small vessel white matter ischemic disease. There is a new (since 09/04/2013) age indeterminate lacunar infarct in the left basal ganglia.  No cervical spine fracture although diffuse degenerative changes are evident.   Electronically Signed   By: Misty Stanley M.D.   On: 04/17/2014 14:31   Ct Cervical Spine Wo Contrast  04/17/2014   CLINICAL DATA:  Initial encounter for fall yesterday with possible slurred speech and left arm weakness.  EXAM: CT HEAD WITHOUT  CONTRAST  CT CERVICAL SPINE WITHOUT CONTRAST  TECHNIQUE: Multidetector CT imaging of the head and cervical spine was performed following the standard protocol without intravenous contrast. Multiplanar CT image reconstructions of the cervical spine were also generated.  COMPARISON:  09/04/2013.  FINDINGS: CT HEAD FINDINGS  There is no evidence for acute hemorrhage, hydrocephalus, mass lesion, or abnormal extra-axial fluid collection. Patchy low attenuation in the deep hemispheric and periventricular white matter is nonspecific, but likely reflects chronic microvascular ischemic demyelination. Chronic bilateral lacunar infarcts are seen in the basal ganglia. On image the 16, there is a new hypodensity in the left containment which has imaging features compatible with age indeterminate infarct although it does appear new since 09/04/2013. Diffuse loss of parenchymal volume is consistent with atrophy.  The visualized paranasal sinuses and mastoid air cells are clear. No evidence for skull fracture.  CT CERVICAL SPINE FINDINGS  Imaging was obtained from the skullbase through the T1 vertebral body. There is no evidence for an acute fracture. Marked loss of disc height is seen at C2-3. There is loss of disc height  with endplate degeneration at C6-7 and to a lesser degree at C5-6. Trace anterolisthesis of C4-5 is compatible with the degree of facet degeneration at this level. Similar trace anterolisthesis is seen at C5-6, stable. There is diffuse facet osteoarthritis throughout bilaterally. No prevertebral soft tissue swelling.  IMPRESSION: Atrophy with chronic small vessel white matter ischemic disease. There is a new (since 09/04/2013) age indeterminate lacunar infarct in the left basal ganglia.  No cervical spine fracture although diffuse degenerative changes are evident.   Electronically Signed   By: Misty Stanley M.D.   On: 04/17/2014 14:31   Carotid Doppler  There is 1-39% bilateral ICA stenosis. Vertebral artery  flow is antegrade.     PHYSICAL EXAM Frail elderly Caucasian lady not in distress.Awake alert. Afebrile. Head is nontraumatic. Neck is supple without bruit. Hearing is normal. Cardiac exam no murmur or gallop. Lungs are clear to auscultation. Distal pulses are well felt. Neurological Exam :Awake alert oriented x 2. Diminished attention, registration and recall. Follows only simple midline and one-step commands. Speech slightly dysarthric but can be understood extraocular movements are full range without nystagmus. Blinks to threat bilaterally. Fundi were not visualized.. Mild left lower face asymmetry. Tongue midline. No drift. Mild left hand weakness. Mild diminished fine finger movements on left. Orbits right over left upper extremity. Mild left grip weak.. Normal sensation . Normal coordination. ASSESSMENT/PLAN Ms. Zitlaly Malson is a 80 y.o. female with history of hypertension and dementia presenting with dysarthria and left hand weakness. She did not receive IV t-PA due to delay in arrival.   Stroke vs TIA:  Non-dominant right brain     CT unremarkable for acute abnormality  MRI  pending   MRA  pending   Carotid Doppler  No significant stenosis   2D Echo  pending   HgbA1c pending  Heparin 5000 units sq tid for VTE prophylaxis  General thin liquids  OOB with assistance  no antithrombotics prior to admission, now on aspirin 81 mg orally every day  Ongoing aggressive risk factor management  Resultant dysarthria and left-sided hemiparesis  Therapy recommendations:  SNF  Disposition:  SNF  Hypertension  Stable  Permissive hypertension (OK if < 220/120) but gradually normalize in 5-7 days  Hyperlipidemia  Home meds:  NO STATIN  LDL 167 goal < 70  Recommend the addition of statin or document contraindication  Continue statin at discharge  Other Stroke Risk Factors Advanced age  Other Active Problems  Baseline dementia  Hospital day # 1  I have personally  examined this patient, reviewed notes, independently viewed imaging studies, participated in medical decision making and plan of care. I have made any additions or clarifications directly to the above note. Agree with note above.   Antony Contras, MD Medical Director [place] Pager: [phone] 04/18/2014 9:08 PM     To contact Stroke Continuity provider, please refer to [email or web address] After hours, contact General Neurology

## 2014-04-19 ENCOUNTER — Inpatient Hospital Stay (HOSPITAL_COMMUNITY): Payer: PRIVATE HEALTH INSURANCE

## 2014-04-19 DIAGNOSIS — I480 Paroxysmal atrial fibrillation: Secondary | ICD-10-CM

## 2014-04-19 LAB — BASIC METABOLIC PANEL
Anion gap: 16 — ABNORMAL HIGH (ref 5–15)
BUN: 28 mg/dL — ABNORMAL HIGH (ref 6–23)
CO2: 23 mEq/L (ref 19–32)
Calcium: 9 mg/dL (ref 8.4–10.5)
Chloride: 103 mEq/L (ref 96–112)
Creatinine, Ser: 1.36 mg/dL — ABNORMAL HIGH (ref 0.50–1.10)
GFR calc Af Amer: 39 mL/min — ABNORMAL LOW (ref >90)
GFR, EST NON AFRICAN AMERICAN: 34 mL/min — AB (ref >90)
GLUCOSE: 95 mg/dL (ref 70–99)
Potassium: 4 mEq/L (ref 3.7–5.3)
SODIUM: 142 meq/L (ref 137–147)

## 2014-04-19 LAB — CBC
HCT: 38.6 % (ref 36.0–46.0)
Hemoglobin: 12.7 g/dL (ref 12.0–15.0)
MCH: 29.9 pg (ref 26.0–34.0)
MCHC: 32.9 g/dL (ref 30.0–36.0)
MCV: 90.8 fL (ref 78.0–100.0)
Platelets: 226 10*3/uL (ref 150–400)
RBC: 4.25 MIL/uL (ref 3.87–5.11)
RDW: 14.6 % (ref 11.5–15.5)
WBC: 8.5 10*3/uL (ref 4.0–10.5)

## 2014-04-19 MED ORDER — AMLODIPINE BESYLATE 5 MG PO TABS: 5.0000 mg | ORAL_TABLET | Freq: Every day | ORAL | Status: DC

## 2014-04-19 MED ORDER — LISINOPRIL 10 MG PO TABS: 10.0000 mg | ORAL_TABLET | Freq: Every day | ORAL | Status: DC

## 2014-04-19 MED ORDER — AMLODIPINE BESYLATE 5 MG PO TABS
5.0000 mg | ORAL_TABLET | ORAL | Status: DC
  Filled 2014-04-19: qty 1

## 2014-04-19 MED ORDER — DONEPEZIL HCL 10 MG PO TABS: 10.0000 mg | ORAL_TABLET | Freq: Every day | ORAL | Status: DC

## 2014-04-19 MED ORDER — HYDRALAZINE HCL 20 MG/ML IJ SOLN
5.0000 mg | Freq: Once | INTRAMUSCULAR | Status: AC
  Administered 2014-04-19: 5 mg via INTRAVENOUS
  Filled 2014-04-19: qty 1

## 2014-04-19 NOTE — Progress Notes (Signed)
SLP Cancellation Note  Patient Details Name: Cindy Trevino MRN: [id number] DOB: May 25, 1925   Cancelled treatment:       Reason Eval/Treat Not Completed: Fatigue/lethargy limiting ability to participate. Per RN, pt received sedating medication in order to complete MRI. Pt not able to sufficiently arouse to participate in cognitive-linguistic evaluation at this time. SLP to return for completion at a more appropriate time.    Germain Osgood, M.A. CCC-SLP [phone]  Germain Osgood 04/19/2014, 3:55 PM

## 2014-04-19 NOTE — Consult Note (Signed)
CARDIOLOGY CONSULT NOTE   Patient ID: Cindy Trevino MRN: [id number], DOB/AGE: Nov 10, 1924   Admit date: 04/17/2014 Date of Consult: 04/19/2014   Primary Physician: No primary provider on file. Primary Cardiologist: None  Pt. Profile  Elderly woman admitted for a recent stroke. History of dementia.  Problem List  Past Medical History  Diagnosis Date  . Dementia   . Dementia   . Hypertension     History reviewed. No pertinent past surgical history.   Allergies  No Known Allergies  HPI   This 80 year old woman was admitted on 04/17/14 for a recent stroke manifested with left hand weakness and dysarthria.  She was found at home by her grandson on the floor. The patient states that she has a past history of high blood pressure.  She denies any history of chest pain or known coronary artery disease.  She states that she has had periods of rapid heart beating in the past.  She has been on metoprolol and lisinopril at home.  She is not able to tell me the name of her PCP. CT scan showed evidence of a new since 09/04/13 age indeterminate lacunar infarct in the left basal ganglia.  This would not explain her present neurologic presentation however.  Inpatient Medications  . [START ON 04/20/2014] amLODipine  5 mg Oral Tomorrow-1000  . aspirin  81 mg Oral Daily  . heparin  5,000 Units Subcutaneous 3 times per day  . metoprolol tartrate  25 mg Oral BID    Family History No family history on file.  the patient has dementia and is unable to tell us about her family history.  Social History History   Social History  . Marital Status: Single    Spouse Name: N/A    Number of Children: N/A  . Years of Education: N/A   Occupational History  . Not on file.   Social History Main Topics  . Smoking status: Never Smoker   . Smokeless tobacco: Not on file  . Alcohol Use: No  . Drug Use: Not on file  . Sexual Activity: Not on file   Other Topics Concern  . Not on file    Social History Narrative  . No narrative on file     Review of Systems  General:  No chills, fever, night sweats or weight changes.  Cardiovascular:  No chest pain, dyspnea on exertion, edema, orthopnea, palpitations, paroxysmal nocturnal dyspnea. Dermatological: No rash, lesions/masses Respiratory: No cough, dyspnea Urologic: No hematuria, dysuria Abdominal:   No nausea, vomiting, diarrhea, bright red blood per rectum, melena, or hematemesis Neurologic:  No visual changes, wkns, changes in mental status. All other systems reviewed and are otherwise negative except as noted above.  Physical Exam  Blood pressure 134/68, pulse 70, temperature 97.7 F (36.5 C), temperature source Oral, resp. rate 18, SpO2 96.00%.  General: Pleasant, NAD.  Her speech is still somewhat dysarthric Psych: Normal affect. Neuro: Alert and oriented X 3. Moves all extremities spontaneously.  Left hand is weaker but she is able to grasp  HEENT: Normal  Neck: Supple without bruits or JVD. Lungs:  Resp regular and unlabored, CTA. Heart: RRR no s3, s4, or murmurs. Abdomen: Soft, non-tender, non-distended, BS + x 4.  Extremities: No clubbing, cyanosis or edema. DP/PT/Radials 2+ and equal bilaterally.  Labs   Recent Labs  04/17/14 1313  CKTOTAL 65  TROPONINI <0.30   Lab Results  Component Value Date   WBC 8.5 04/19/2014   HGB 12.7 04/19/2014  HCT 38.6 04/19/2014   MCV 90.8 04/19/2014   PLT 226 04/19/2014    Recent Labs Lab 04/19/14 0452  NA 142  K 4.0  CL 103  CO2 23  BUN 28*  CREATININE 1.36*  CALCIUM 9.0  GLUCOSE 95   Lab Results  Component Value Date   CHOL 227* 04/18/2014   HDL 40 04/18/2014   LDLCALC 167* 04/18/2014   TRIG 102 04/18/2014   No results found for this basename: DDIMER    Radiology/Studies  Dg Chest 2 View  04/17/2014   CLINICAL DATA:  Altered mental status.  EXAM: CHEST  2 VIEW  COMPARISON:  None.  FINDINGS: The heart size and mediastinal contours are  within normal limits. Both lungs are clear. No pneumothorax or pleural effusion is noted. The visualized skeletal structures are unremarkable.  IMPRESSION: No acute cardiopulmonary abnormality seen.   Electronically Signed   By: Sabino Dick M.D.   On: 04/17/2014 14:30   Dg Lumbar Spine Complete  04/17/2014   CLINICAL DATA:  Altered mental status.  EXAM: LUMBAR SPINE - COMPLETE 4+ VIEW  COMPARISON:  None.  FINDINGS: No fracture is noted. Grade 1 anterolisthesis of L3-4 is noted secondary to posterior facet joint hypertrophy. Moderate degenerative disc disease is noted at this level. Severe degenerative disc disease is noted at L4-5 and L5-S1. Lumbarized S1 vertebral body is noted.  IMPRESSION: Severe multilevel degenerative disc disease is noted. No acute abnormality seen in the lumbar spine.   Electronically Signed   By: Sabino Dick M.D.   On: 04/17/2014 14:34   Ct Head Wo Contrast  04/17/2014   CLINICAL DATA:  Initial encounter for fall yesterday with possible slurred speech and left arm weakness.  EXAM: CT HEAD WITHOUT CONTRAST  CT CERVICAL SPINE WITHOUT CONTRAST  TECHNIQUE: Multidetector CT imaging of the head and cervical spine was performed following the standard protocol without intravenous contrast. Multiplanar CT image reconstructions of the cervical spine were also generated.  COMPARISON:  09/04/2013.  FINDINGS: CT HEAD FINDINGS  There is no evidence for acute hemorrhage, hydrocephalus, mass lesion, or abnormal extra-axial fluid collection. Patchy low attenuation in the deep hemispheric and periventricular white matter is nonspecific, but likely reflects chronic microvascular ischemic demyelination. Chronic bilateral lacunar infarcts are seen in the basal ganglia. On image the 16, there is a new hypodensity in the left containment which has imaging features compatible with age indeterminate infarct although it does appear new since 09/04/2013. Diffuse loss of parenchymal volume is consistent with  atrophy.  The visualized paranasal sinuses and mastoid air cells are clear. No evidence for skull fracture.  CT CERVICAL SPINE FINDINGS  Imaging was obtained from the skullbase through the T1 vertebral body. There is no evidence for an acute fracture. Marked loss of disc height is seen at C2-3. There is loss of disc height with endplate degeneration at C6-7 and to a lesser degree at C5-6. Trace anterolisthesis of C4-5 is compatible with the degree of facet degeneration at this level. Similar trace anterolisthesis is seen at C5-6, stable. There is diffuse facet osteoarthritis throughout bilaterally. No prevertebral soft tissue swelling.  IMPRESSION: Atrophy with chronic small vessel white matter ischemic disease. There is a new (since 09/04/2013) age indeterminate lacunar infarct in the left basal ganglia.  No cervical spine fracture although diffuse degenerative changes are evident.   Electronically Signed   By: Misty Stanley M.D.   On: 04/17/2014 14:31   Ct Cervical Spine Wo Contrast  04/17/2014   CLINICAL  DATA:  Initial encounter for fall yesterday with possible slurred speech and left arm weakness.  EXAM: CT HEAD WITHOUT CONTRAST  CT CERVICAL SPINE WITHOUT CONTRAST  TECHNIQUE: Multidetector CT imaging of the head and cervical spine was performed following the standard protocol without intravenous contrast. Multiplanar CT image reconstructions of the cervical spine were also generated.  COMPARISON:  09/04/2013.  FINDINGS: CT HEAD FINDINGS  There is no evidence for acute hemorrhage, hydrocephalus, mass lesion, or abnormal extra-axial fluid collection. Patchy low attenuation in the deep hemispheric and periventricular white matter is nonspecific, but likely reflects chronic microvascular ischemic demyelination. Chronic bilateral lacunar infarcts are seen in the basal ganglia. On image the 16, there is a new hypodensity in the left containment which has imaging features compatible with age indeterminate infarct  although it does appear new since 09/04/2013. Diffuse loss of parenchymal volume is consistent with atrophy.  The visualized paranasal sinuses and mastoid air cells are clear. No evidence for skull fracture.  CT CERVICAL SPINE FINDINGS  Imaging was obtained from the skullbase through the T1 vertebral body. There is no evidence for an acute fracture. Marked loss of disc height is seen at C2-3. There is loss of disc height with endplate degeneration at C6-7 and to a lesser degree at C5-6. Trace anterolisthesis of C4-5 is compatible with the degree of facet degeneration at this level. Similar trace anterolisthesis is seen at C5-6, stable. There is diffuse facet osteoarthritis throughout bilaterally. No prevertebral soft tissue swelling.  IMPRESSION: Atrophy with chronic small vessel white matter ischemic disease. There is a new (since 09/04/2013) age indeterminate lacunar infarct in the left basal ganglia.  No cervical spine fracture although diffuse degenerative changes are evident.   Electronically Signed   By: Misty Stanley M.D.   On: 04/17/2014 14:31    ECG  EKG shows normal sinus rhythm and no acute ischemic changes.  Telemetry overnight shows evidence of short bursts of atrial fibrillation at heart rates of 145-160.  2-D echo Study Conclusions  - Left ventricle: The cavity size was normal. Systolic function was normal. The estimated ejection fraction was in the range of 50% to 55%. Wall motion was normal; there were no regional wall motion abnormalities. Doppler parameters are consistent with abnormal left ventricular relaxation (grade 1 diastolic dysfunction). Doppler parameters are consistent with high ventricular filling pressure. - Aortic valve: There was mild regurgitation. - Mitral valve: Calcified annulus. Mildly thickened leaflets . There was mild regurgitation.   ASSESSMENT AND PLAN  1. Recent CVA with dysarthria and weakness of left hand 2. essential hypertension by  history 3. Hypercholesterolemia 4. Dementia 5. paroxysmal atrial fibrillation on telemetry.  Chadssvasc score is at least 6 (hypertension, age, stroke, and female gender).  Recommendation: I would recommend starting her on anticoagulation to prevent further strokes, if okay with neurology.  Her initial CT did not show any evidence of intracerebral hemorrhage.  An MRI of the brain is pending. I would suggest Xarelto in renal dose of 15 mg daily.  She is not anemic and does not appear to have any contraindications to anticoagulation.  Once she is on anticoagulation there will not be a reason to continue baby aspirin Continue metoprolol. Agree with starting statin for her LDL of 167  Signed, Darlin Coco, MD  04/19/2014, 9:05 AM

## 2014-04-19 NOTE — Clinical Social Work Note (Signed)
Clinical Social Worker attempted to make contact with pt's daughter, Kristene Liberati and pt's grandson, Cleone Hulick on 10/20 and today in reference to SNF placement. CSW left message with both pt's daughter and grandson. Pt's daughter reported to pt's nurse on 10/20 of being pt's HCPOA.   Clinical Social Worker will continue to contact pt's daughter and grandson. CSW to follow pt to provide assistance with SNF placement and facilitate discharge needs. CSW psychosocial to be completed once pt's daughter is available.   Glendon Axe, MSW, LCSWA [phone] 04/19/2014 2:56 PM

## 2014-04-19 NOTE — Progress Notes (Signed)
Subjective: Cindy Trevino was noted to be in paroxysmal tachycardia on telemetry yesterday afternoon. She was restarted on her home metoprolol and cardiology was called this morning. The have since seen the patient and recommend renal dosing of xarelto for paroxysmal atrial fibrillation. She has not yet had her head MRI as radiology believes she requires ativan. Night team called as BP 217/96. They gave hydralazine 5 mg and repeat BP was 134/68. Patient interviewed this morning without family members available and is demented/poor historian. She had no complaints and appeared to be in a very pleasant mood. She did not really understand ROS questions about numbness or weakness.  Objective: Vital signs in last 24 hours: Filed Vitals:   04/19/14 0152 04/19/14 0634 04/19/14 0815 04/19/14 0958  BP: 152/63 217/96 134/68 116/63  Pulse: 61 59 70 69  Temp: 98.2 F (36.8 C) 97.7 F (36.5 C)  97.5 F (36.4 C)  TempSrc: Oral Oral  Oral  Resp: 20 20 18 18   SpO2:  96%  96%   Weight change:   Intake/Output Summary (Last 24 hours) at 04/19/14 1118 Last data filed at 04/19/14 0900  Gross per 24 hour  Intake    480 ml  Output      0 ml  Net    480 ml    General Appearance: Sitting up in bed, alert and oriented to person and place but not time or reason for being here , pleasant, attempts to cooperate, frail but no distress, appears stated age  HEENT: Normocephalic, without obvious abnormality, atraumatic, PERRL, EOMI, MMM  Back: Kyphotic  Lungs: Clear to auscultation bilaterally, respirations unlabored but very poor effort Heart: regular rate and rhythm, S1 and S2 normal, no murmur, rub or gallop  Abdomen: Soft, non-tender, bowel sounds active all four quadrants, no masses, no organomegaly  Extremities: Extremities normal, atraumatic, no cyanosis or edema  Pulses: 2+ and symmetric all extremities  Neurologic: very poor effort but CNII-XII intact, question of effort on strength tests but R ULE and LE  5/5, question of weaker LUE and LLE but still 5/5, unchanged   Lab Results: Basic Metabolic Panel:  Recent Labs Lab 04/17/14 1313 04/19/14 0452  NA 140 142  K 3.7 4.0  CL 101 103  CO2 24 23  GLUCOSE 98 95  BUN 20 28*  CREATININE 1.23* 1.36*  CALCIUM 9.5 9.0  CBC:  Recent Labs Lab 04/17/14 1313 04/19/14 0452  WBC 6.6 8.5  NEUTROABS 4.7  --   HGB 13.6 12.7  HCT 40.4 38.6  MCV 88.0 90.8  PLT 241 226   Cardiac Enzymes:  Recent Labs Lab 04/17/14 1313  CKTOTAL 65  TROPONINI <0.30   CBG:  Recent Labs Lab 04/17/14 1338  GLUCAP 90   Hemoglobin A1C:  Recent Labs Lab 04/18/14 0710  HGBA1C 5.7*   Fasting Lipid Panel:  Recent Labs Lab 04/18/14 0710  CHOL 227*  HDL 40  LDLCALC 167*  TRIG 102  CHOLHDL 5.7  Urinalysis:  Recent Labs Lab 04/17/14 1340  COLORURINE YELLOW  LABSPEC 1.015  PHURINE 6.0  GLUCOSEU NEGATIVE  HGBUR NEGATIVE  BILIRUBINUR NEGATIVE  KETONESUR NEGATIVE  PROTEINUR NEGATIVE  UROBILINOGEN 0.2  NITRITE NEGATIVE  LEUKOCYTESUR NEGATIVE   Echo: Left ventricle: The cavity size was normal. Systolic function was normal. The estimated ejection fraction was in the range of 50% to 55%. Wall motion was normal; there were no regional wall motion abnormalities. Doppler parameters are consistent with abnormal left ventricular relaxation (grade 1 diastolic dysfunction).  Doppler parameters are consistent with high ventricular filling pressure. - Aortic valve: There was mild regurgitation. - Mitral valve: Calcified annulus. Mildly thickened leaflets . There was mild regurgitation.  Carotid Dopplers: Findings suggest 1-39% internal carotid artery stenosis bilaterally. Vertebral arteries are patent with antegrade flow.   Studies/Results: Dg Chest 2 View  04/17/2014   CLINICAL DATA:  Altered mental status.  EXAM: CHEST  2 VIEW  COMPARISON:  None.  FINDINGS: The heart size and mediastinal contours are within normal limits. Both lungs are  clear. No pneumothorax or pleural effusion is noted. The visualized skeletal structures are unremarkable.  IMPRESSION: No acute cardiopulmonary abnormality seen.   Electronically Signed   By: Sabino Dick M.D.   On: 04/17/2014 14:30   Dg Lumbar Spine Complete  04/17/2014   CLINICAL DATA:  Altered mental status.  EXAM: LUMBAR SPINE - COMPLETE 4+ VIEW  COMPARISON:  None.  FINDINGS: No fracture is noted. Grade 1 anterolisthesis of L3-4 is noted secondary to posterior facet joint hypertrophy. Moderate degenerative disc disease is noted at this level. Severe degenerative disc disease is noted at L4-5 and L5-S1. Lumbarized S1 vertebral body is noted.  IMPRESSION: Severe multilevel degenerative disc disease is noted. No acute abnormality seen in the lumbar spine.   Electronically Signed   By: Sabino Dick M.D.   On: 04/17/2014 14:34   Ct Head Wo Contrast  04/17/2014   CLINICAL DATA:  Initial encounter for fall yesterday with possible slurred speech and left arm weakness.  EXAM: CT HEAD WITHOUT CONTRAST  CT CERVICAL SPINE WITHOUT CONTRAST  TECHNIQUE: Multidetector CT imaging of the head and cervical spine was performed following the standard protocol without intravenous contrast. Multiplanar CT image reconstructions of the cervical spine were also generated.  COMPARISON:  09/04/2013.  FINDINGS: CT HEAD FINDINGS  There is no evidence for acute hemorrhage, hydrocephalus, mass lesion, or abnormal extra-axial fluid collection. Patchy low attenuation in the deep hemispheric and periventricular white matter is nonspecific, but likely reflects chronic microvascular ischemic demyelination. Chronic bilateral lacunar infarcts are seen in the basal ganglia. On image the 16, there is a new hypodensity in the left containment which has imaging features compatible with age indeterminate infarct although it does appear new since 09/04/2013. Diffuse loss of parenchymal volume is consistent with atrophy.  The visualized paranasal  sinuses and mastoid air cells are clear. No evidence for skull fracture.  CT CERVICAL SPINE FINDINGS  Imaging was obtained from the skullbase through the T1 vertebral body. There is no evidence for an acute fracture. Marked loss of disc height is seen at C2-3. There is loss of disc height with endplate degeneration at C6-7 and to a lesser degree at C5-6. Trace anterolisthesis of C4-5 is compatible with the degree of facet degeneration at this level. Similar trace anterolisthesis is seen at C5-6, stable. There is diffuse facet osteoarthritis throughout bilaterally. No prevertebral soft tissue swelling.  IMPRESSION: Atrophy with chronic small vessel white matter ischemic disease. There is a new (since 09/04/2013) age indeterminate lacunar infarct in the left basal ganglia.  No cervical spine fracture although diffuse degenerative changes are evident.   Electronically Signed   By: Misty Stanley M.D.   On: 04/17/2014 14:31   Ct Cervical Spine Wo Contrast  04/17/2014   CLINICAL DATA:  Initial encounter for fall yesterday with possible slurred speech and left arm weakness.  EXAM: CT HEAD WITHOUT CONTRAST  CT CERVICAL SPINE WITHOUT CONTRAST  TECHNIQUE: Multidetector CT imaging of the head and cervical  spine was performed following the standard protocol without intravenous contrast. Multiplanar CT image reconstructions of the cervical spine were also generated.  COMPARISON:  09/04/2013.  FINDINGS: CT HEAD FINDINGS  There is no evidence for acute hemorrhage, hydrocephalus, mass lesion, or abnormal extra-axial fluid collection. Patchy low attenuation in the deep hemispheric and periventricular white matter is nonspecific, but likely reflects chronic microvascular ischemic demyelination. Chronic bilateral lacunar infarcts are seen in the basal ganglia. On image the 16, there is a new hypodensity in the left containment which has imaging features compatible with age indeterminate infarct although it does appear new since  09/04/2013. Diffuse loss of parenchymal volume is consistent with atrophy.  The visualized paranasal sinuses and mastoid air cells are clear. No evidence for skull fracture.  CT CERVICAL SPINE FINDINGS  Imaging was obtained from the skullbase through the T1 vertebral body. There is no evidence for an acute fracture. Marked loss of disc height is seen at C2-3. There is loss of disc height with endplate degeneration at C6-7 and to a lesser degree at C5-6. Trace anterolisthesis of C4-5 is compatible with the degree of facet degeneration at this level. Similar trace anterolisthesis is seen at C5-6, stable. There is diffuse facet osteoarthritis throughout bilaterally. No prevertebral soft tissue swelling.  IMPRESSION: Atrophy with chronic small vessel white matter ischemic disease. There is a new (since 09/04/2013) age indeterminate lacunar infarct in the left basal ganglia.  No cervical spine fracture although diffuse degenerative changes are evident.   Electronically Signed   By: Misty Stanley M.D.   On: 04/17/2014 14:31   Medications: I have reviewed the patient's current medications. Scheduled Meds: . [START ON 04/20/2014] amLODipine  5 mg Oral Tomorrow-1000  . aspirin  81 mg Oral Daily  . heparin  5,000 Units Subcutaneous 3 times per day  . metoprolol tartrate  25 mg Oral BID   Continuous Infusions:  PRN Meds:. Assessment/Plan: Active Problems:   CVA (cerebral infarction)  #Left-sided Weakness: Cindy Trevino is receiving full stroke work-up. History unclear but concern for L sided weakness and possible slurred speech. Questionable left sided upper and lower extremity weakness on exam but poor effort as demented. Per grandson, no known history of previous CVA. Head CT notable for new (since 08/2013) age indeterminate lacunar infarct in the L basal ganglia. However, this would not explain L sided weakness. She may have new R cortical lesion not detected on CT. Echo and carotid with non-contributory results.  Paroxysmal atrial fibrillation noted on tele which may explain lacunar infarcts on exam. Cards recommends xarelto renally dosed at 15 mg if neuro agrees as her chadsvasc score is 6. We would like to have goals of care discussion with family about risks v benefits of anticoagulation. PT/OT recommends SNF. Brain MRI/MRA attempted but unsuccessful due to patient cooperation. She is written for ativan for imaging. If unable to obtain, will discuss whether neuro recommends pursuing with sedation. -appreciate neurology, cards consult  -Brain MRI/MRA - ASA 81 daily starting tomorrow  -cardiac monitoring  -PT/OT  -restart zocor 40 mg qhs  #HTN: BP meds were intially held for permissive HTN in setting of possible stroke. She had elevated BP 217/96 overnight and received hydralazine 5 mg IV and went down to 134/68. We restarted her metoprolol yesterday in setting of a fib but are holding her lisinopril and amlodipine for now and will reassess. Of note, she has two charts in [facility] which are not merged. This current chart has metoprolol and lisinopril listed as  BP meds while the other (MRN: [id number])  has metoprolol and amlodipine listed. We will try to reconcile with family. -cont metoprolol tartrate 25 mg po BID and hold other BP for now -cont to reassess  #Dementia: Patient alert and oriented to person and place but not date or why she is here. [name] says she is at baseline. Likely secondary to chronic microvascular ischemic demyelination and chronic bilateral lacunar infarcts on head CT. Per second chart (see above), she takes donepezil 10 mg daily and zoloft 100 mg daily. Will reconcile meds with family -restart donepezil 10 mg daily -hold zoloft for now -Appreciate neuro consult   #Diet: regular  #VTE PPX: heparin 5000 u [place]  Dispo: Disposition is deferred at this time, awaiting improvement of current medical problems.  Anticipated discharge in approximately 1 day(s).   The patient does not know  have a current PCP (No primary provider on file.) and does need an [facility] hospital follow-up appointment after discharge.  The patient does not know have transportation limitations that hinder transportation to clinic appointments.  .Services Needed at time of discharge: Y = Yes, Blank = No PT: Y  OT: Y  RN:   Equipment:   Other:     LOS: 2 days   Kelby Aline, MD 04/19/2014, 11:18 AM

## 2014-04-19 NOTE — Progress Notes (Signed)
  I have seen and examined the patient myself, and I have reviewed the note by Andee Poles Day, MS 3 and was present during the interview and physical exam.  Please see my separate H&P for additional findings, assessment, and plan.   Signed: Kelby Aline, MD 04/19/2014, 4:19 PM

## 2014-04-19 NOTE — Progress Notes (Signed)
Pt seen and examined with Dr. Ethelene Hal. Please refer to resident note for details.  Pt noted to be in pafib on telemetry. She denies any complaints today including palpitations.  Had an episode of BP 217/96 overnight and was given IV hydralazine.   Exam:  Gen: AAO*2, NAD  CV: RRR, normal heart sounds  Lungs: CTA b/l  Abd: soft, non tender. BS +  Ext: no edema   Assessment and Plan:  80 y/o female with acute L sided weakness and dysarthria possibly CVA   Left sided weakness:  - Pt with acute CVA likely secondary to afib - Neuro f/u noted - MRI noted - acute infarctions in both basal ganglia. No major vessel occlusion - c/w asa  - carotid dopplers noted - Pt will need SNF on d/c  - Would start a statin today given elevated LDL and acute CVA   Tachycardia:  - c/w metoprolol for now - Cardio f/u noted. Recommend starting renally dosed xarelto - Resident d/w family and they are deciding on whether they want a/c or not  Dr. Ethelene Hal discussed case with daughter - in - law who is HCP and she wants pt to be DNR at this time

## 2014-04-19 NOTE — Progress Notes (Signed)
Subjective: Episodes of afib/aflutter overnight which was treated with metropolol and episodes of tachycardia (217/96) which was treated with hydralazine, and lead to a drop in blood pressure to 134/68. This morning Ms. Stanfill was having difficulty articulating her responses to questions, which has been her baseline since we have been taking care of her. However, she denied any chest pain or palpitations this morning and states that she is feeling better.   Objective: Vital signs in last 24 hours: Filed Vitals:   04/19/14 0152 04/19/14 0634 04/19/14 0815 04/19/14 0958  BP: 152/63 217/96 134/68 116/63  Pulse: 61 59 70 69  Temp: 98.2 F (36.8 C) 97.7 F (36.5 C)  97.5 F (36.4 C)  TempSrc: Oral Oral  Oral  Resp: 20 20 18 18   SpO2:  96%  96%   Weight change:   Intake/Output Summary (Last 24 hours) at 04/19/14 1114 Last data filed at 04/19/14 0900  Gross per 24 hour  Intake    480 ml  Output      0 ml  Net    480 ml   General Appearance: Alert and oriented to person and place but not time or reason for being here , pleasant, attempts to cooperate, no distress, appears stated age  80: Normocephalic, without obvious abnormality, atraumatic, PERRL, EOMI, MMM  Neck: No carotid bruit or JVD  Back: Kyphotic, no CVA tenderness  Lungs: Clear to auscultation bilaterally, respirations unlabored but very poor effort  Heart: Regular rate and rhythm, S1 and S2 normal, 2/6 early systolic murmur  Abdomen: Soft, non-tender, bowel sounds active all four quadrants, no masses, no organomegaly  Extremities: Extremities normal, atraumatic, no cyanosis or edema  Pulses: 2+ and symmetric all extremities  Neurologic: CNII-XII intact, question of effort on strength tests but R ULE and LE 5/5, question of weaker LUE and LLE but still 5/5   Lab Results: Basic Metabolic Panel:   Recent Labs  Lab  04/17/14 1313  04/19/14 0452   NA  140  142   K  3.7  4.0   CL  101  103   CO2  24  23   GLUCOSE  98   95   BUN  20  28*   CREATININE  1.23*  1.36*   CALCIUM  9.5  9.0   CBC:   Recent Labs  Lab  04/17/14 1313  04/19/14 0452   WBC  6.6  8.5   NEUTROABS  4.7  --   HGB  13.6  12.7   HCT  40.4  38.6   MCV  88.0  90.8   PLT  241  226    Cardiac Enzymes:   Recent Labs  Lab  04/17/14 1313   CKTOTAL  65   TROPONINI  <0.30    CBG:   Recent Labs  Lab  04/17/14 1338   GLUCAP  90    Hemoglobin A1C:   Recent Labs  Lab  04/18/14 0710   HGBA1C  5.7*    Fasting Lipid Panel:   Recent Labs  Lab  04/18/14 0710   CHOL  227*   HDL  40   LDLCALC  167*   TRIG  102   CHOLHDL  5.7   Urinalysis:   Recent Labs  Lab  04/17/14 1340   COLORURINE  YELLOW   LABSPEC  1.015   PHURINE  6.0   GLUCOSEU  NEGATIVE   HGBUR  NEGATIVE   BILIRUBINUR  NEGATIVE   KETONESUR  NEGATIVE  PROTEINUR  NEGATIVE   UROBILINOGEN  0.2   NITRITE  NEGATIVE   LEUKOCYTESUR  NEGATIVE      Micro Results: Recent Results (from the past 240 hour(s))  URINE CULTURE     Status: None   Collection Time    04/17/14  1:40 PM      Result Value Ref Range Status   Specimen Description URINE, CATHETERIZED   Final   Special Requests NONE   Final   Culture  Setup Time     Final   Value: 04/17/2014 14:28     Performed at [facility]     Final   Value: NO GROWTH     Performed at [facility]   Culture     Final   Value: NO GROWTH     Performed at [facility]   Report Status 04/18/2014 FINAL   Final   Studies/Results: Dg Chest 2 View  04/17/2014   CLINICAL DATA:  Altered mental status.  EXAM: CHEST  2 VIEW  COMPARISON:  None.  FINDINGS: The heart size and mediastinal contours are within normal limits. Both lungs are clear. No pneumothorax or pleural effusion is noted. The visualized skeletal structures are unremarkable.  IMPRESSION: No acute cardiopulmonary abnormality seen.   Electronically Signed   By: Sabino Dick M.D.   On: 04/17/2014 14:30   Dg Lumbar Spine  Complete  04/17/2014   CLINICAL DATA:  Altered mental status.  EXAM: LUMBAR SPINE - COMPLETE 4+ VIEW  COMPARISON:  None.  FINDINGS: No fracture is noted. Grade 1 anterolisthesis of L3-4 is noted secondary to posterior facet joint hypertrophy. Moderate degenerative disc disease is noted at this level. Severe degenerative disc disease is noted at L4-5 and L5-S1. Lumbarized S1 vertebral body is noted.  IMPRESSION: Severe multilevel degenerative disc disease is noted. No acute abnormality seen in the lumbar spine.   Electronically Signed   By: Sabino Dick M.D.   On: 04/17/2014 14:34   Ct Head Wo Contrast  04/17/2014   CLINICAL DATA:  Initial encounter for fall yesterday with possible slurred speech and left arm weakness.  EXAM: CT HEAD WITHOUT CONTRAST  CT CERVICAL SPINE WITHOUT CONTRAST  TECHNIQUE: Multidetector CT imaging of the head and cervical spine was performed following the standard protocol without intravenous contrast. Multiplanar CT image reconstructions of the cervical spine were also generated.  COMPARISON:  09/04/2013.  FINDINGS: CT HEAD FINDINGS  There is no evidence for acute hemorrhage, hydrocephalus, mass lesion, or abnormal extra-axial fluid collection. Patchy low attenuation in the deep hemispheric and periventricular white matter is nonspecific, but likely reflects chronic microvascular ischemic demyelination. Chronic bilateral lacunar infarcts are seen in the basal ganglia. On image the 16, there is a new hypodensity in the left containment which has imaging features compatible with age indeterminate infarct although it does appear new since 09/04/2013. Diffuse loss of parenchymal volume is consistent with atrophy.  The visualized paranasal sinuses and mastoid air cells are clear. No evidence for skull fracture.  CT CERVICAL SPINE FINDINGS  Imaging was obtained from the skullbase through the T1 vertebral body. There is no evidence for an acute fracture. Marked loss of disc height is seen at  C2-3. There is loss of disc height with endplate degeneration at C6-7 and to a lesser degree at C5-6. Trace anterolisthesis of C4-5 is compatible with the degree of facet degeneration at this level. Similar trace anterolisthesis is seen at C5-6, stable. There is diffuse  facet osteoarthritis throughout bilaterally. No prevertebral soft tissue swelling.  IMPRESSION: Atrophy with chronic small vessel white matter ischemic disease. There is a new (since 09/04/2013) age indeterminate lacunar infarct in the left basal ganglia.  No cervical spine fracture although diffuse degenerative changes are evident.   Electronically Signed   By: Misty Stanley M.D.   On: 04/17/2014 14:31   Ct Cervical Spine Wo Contrast  04/17/2014   CLINICAL DATA:  Initial encounter for fall yesterday with possible slurred speech and left arm weakness.  EXAM: CT HEAD WITHOUT CONTRAST  CT CERVICAL SPINE WITHOUT CONTRAST  TECHNIQUE: Multidetector CT imaging of the head and cervical spine was performed following the standard protocol without intravenous contrast. Multiplanar CT image reconstructions of the cervical spine were also generated.  COMPARISON:  09/04/2013.  FINDINGS: CT HEAD FINDINGS  There is no evidence for acute hemorrhage, hydrocephalus, mass lesion, or abnormal extra-axial fluid collection. Patchy low attenuation in the deep hemispheric and periventricular white matter is nonspecific, but likely reflects chronic microvascular ischemic demyelination. Chronic bilateral lacunar infarcts are seen in the basal ganglia. On image the 16, there is a new hypodensity in the left containment which has imaging features compatible with age indeterminate infarct although it does appear new since 09/04/2013. Diffuse loss of parenchymal volume is consistent with atrophy.  The visualized paranasal sinuses and mastoid air cells are clear. No evidence for skull fracture.  CT CERVICAL SPINE FINDINGS  Imaging was obtained from the skullbase through the T1  vertebral body. There is no evidence for an acute fracture. Marked loss of disc height is seen at C2-3. There is loss of disc height with endplate degeneration at C6-7 and to a lesser degree at C5-6. Trace anterolisthesis of C4-5 is compatible with the degree of facet degeneration at this level. Similar trace anterolisthesis is seen at C5-6, stable. There is diffuse facet osteoarthritis throughout bilaterally. No prevertebral soft tissue swelling.  IMPRESSION: Atrophy with chronic small vessel white matter ischemic disease. There is a new (since 09/04/2013) age indeterminate lacunar infarct in the left basal ganglia.  No cervical spine fracture although diffuse degenerative changes are evident.   Electronically Signed   By: Misty Stanley M.D.   On: 04/17/2014 14:31   Medications:  Scheduled Meds: . [START ON 04/20/2014] amLODipine  5 mg Oral Tomorrow-1000  . aspirin  81 mg Oral Daily  . heparin  5,000 Units Subcutaneous 3 times per Sana Tessmer  . metoprolol tartrate  25 mg Oral BID   Continuous Infusions:  PRN Meds:.LORazepam Assessment/Plan: Active Problems:   CVA (cerebral infarction)  [place] is an 80 year old female with a PMH of HTN and dementia who was admitted for confusion, slurred speech and new left hand weakness on 04/17/14  1. Atrial Fibrillation: Nursing paged the overnight team for episodes of atrial fibrillation. - Restarted home metropolol BID  - Cardiology consulted- recommending Xarelto. Planning on having a discussion this afternoon with pt's power of attorney (daughter in law) this afternoon.  2. Left sided weakness: Poor historian but concern for L sided weakness and slurred speech. Potential left upper and lower extremity weakness on exam, but difficult to assess due to poor effort 2/2 dementia. Head CT compared to earlier CT in 08/2013 was notable for new lacunar infart in L basal ganglia, however this would not cause her left sided weakness. Afib was most likely cause of  emboli.  - Echo back on 04/18/14- no concern for clotting, PFO, or endocarditis  - Carotid doppler-  not concerning (less than 70% stenosis) - ASA 81 daily - Passed swallow screen yesterday  - Neurology following  - PT/OT   3. HTN: HTN up to 217/96 O/N. On metoprolol 25 mg daily. - Given Hydralazine overnight and blood pressure dropped to 134/68 - Holding lisinopril due to elevated creatinine (1.36) - Neuro rec from today: hold BP meds unless BP 220/120    4. Dementia: Patient not alert and oriented to person and place or date this am. [name] said she is was baseline when she was admitted. Likely secondary to chronic microvascular ischemic demyelination and chronic bilateral lacunar infarcts on head CT.  -Neuro following   5. Diet: NPO   6. PPx: heparin 5000 u [place]  This is a Careers information officer Note.  The care of the patient was discussed with Dr. Dareen Piano  and the assessment and plan formulated with their assistance.  Please see their attached note for official documentation of the daily encounter.   LOS: 2 days   Sherie Don Shonia Skilling, Med Student 04/19/2014, 11:14 AM

## 2014-04-19 NOTE — Progress Notes (Signed)
STROKE TEAM PROGRESS NOTE   HISTORY Cindy Trevino is an 80 y.o. female with a past medical history significant for HTN and dementia, brought in by EMS for further evaluation of dysarthria and left-hand weakness. Cindy Trevino is a the bedside and said that patient's home aide found her on the floor and she was noted to be " little altered" with spurred speech and left hand weakness around 10:30 or 11 am today. Patient's grandson said that she is also noticing some droopiness of the left corner of her mouth. Cindy Trevino said that " I just can not use the left hand".Unsure of LSN, possibly Friday. Denies HA, vertigo, double vision, difficulty swallowing, or visual disturbances. CT brain revealed " a new (since 09/04/2013) age indeterminate lacunar infarct in the left basal ganglia". Patient was not administered TPA secondary to delay in arrival. She was admitted for further evaluation and treatment.   SUBJECTIVE (INTERVAL HISTORY) No family at the bedside.     OBJECTIVE Temp:  [97.5 F (36.4 C)-98.2 F (36.8 C)] 97.5 F (36.4 C) (10/21 0958) Pulse Rate:  [59-72] 69 (10/21 0958) Cardiac Rhythm:  [-] Normal sinus rhythm (10/20 2000) Resp:  [18-22] 18 (10/21 0958) BP: (116-217)/(63-99) 116/63 mmHg (10/21 0958) SpO2:  [96 %-98 %] 96 % (10/21 0958)   Recent Labs Lab 04/17/14 1338  GLUCAP 90    Recent Labs Lab 04/17/14 1313 04/19/14 0452  NA 140 142  K 3.7 4.0  CL 101 103  CO2 24 23  GLUCOSE 98 95  BUN 20 28*  CREATININE 1.23* 1.36*  CALCIUM 9.5 9.0   No results found for this basename: AST, ALT, ALKPHOS, BILITOT, PROT, ALBUMIN,  in the last 168 hours  Recent Labs Lab 04/17/14 1313 04/19/14 0452  WBC 6.6 8.5  NEUTROABS 4.7  --   HGB 13.6 12.7  HCT 40.4 38.6  MCV 88.0 90.8  PLT 241 226    Recent Labs Lab 04/17/14 1313  CKTOTAL 65  TROPONINI <0.30   No results found for this basename: LABPROT, INR,  in the last 72 hours  Recent Labs  04/17/14 1340  COLORURINE  YELLOW  LABSPEC 1.015  PHURINE 6.0  GLUCOSEU NEGATIVE  HGBUR NEGATIVE  BILIRUBINUR NEGATIVE  KETONESUR NEGATIVE  PROTEINUR NEGATIVE  UROBILINOGEN 0.2  NITRITE NEGATIVE  LEUKOCYTESUR NEGATIVE       Component Value Date/Time   CHOL 227* 04/18/2014 0710   TRIG 102 04/18/2014 0710   HDL 40 04/18/2014 0710   CHOLHDL 5.7 04/18/2014 0710   VLDL 20 04/18/2014 0710   LDLCALC 167* 04/18/2014 0710   Lab Results  Component Value Date   HGBA1C 5.7* 04/18/2014   No results found for this basename: labopia,  cocainscrnur,  labbenz,  amphetmu,  thcu,  labbarb    No results found for this basename: ETH,  in the last 168 hours  Dg Chest 2 View  04/17/2014   CLINICAL DATA:  Altered mental status.  EXAM: CHEST  2 VIEW  COMPARISON:  None.  FINDINGS: The heart size and mediastinal contours are within normal limits. Both lungs are clear. No pneumothorax or pleural effusion is noted. The visualized skeletal structures are unremarkable.  IMPRESSION: No acute cardiopulmonary abnormality seen.   Electronically Signed   By: Sabino Dick M.D.   On: 04/17/2014 14:30   Dg Lumbar Spine Complete  04/17/2014   CLINICAL DATA:  Altered mental status.  EXAM: LUMBAR SPINE - COMPLETE 4+ VIEW  COMPARISON:  None.  FINDINGS: No fracture is noted.  Grade 1 anterolisthesis of L3-4 is noted secondary to posterior facet joint hypertrophy. Moderate degenerative disc disease is noted at this level. Severe degenerative disc disease is noted at L4-5 and L5-S1. Lumbarized S1 vertebral body is noted.  IMPRESSION: Severe multilevel degenerative disc disease is noted. No acute abnormality seen in the lumbar spine.   Electronically Signed   By: Sabino Dick M.D.   On: 04/17/2014 14:34   Ct Head Wo Contrast  04/17/2014   CLINICAL DATA:  Initial encounter for fall yesterday with possible slurred speech and left arm weakness.  EXAM: CT HEAD WITHOUT CONTRAST  CT CERVICAL SPINE WITHOUT CONTRAST  TECHNIQUE: Multidetector CT imaging of the  head and cervical spine was performed following the standard protocol without intravenous contrast. Multiplanar CT image reconstructions of the cervical spine were also generated.  COMPARISON:  09/04/2013.  FINDINGS: CT HEAD FINDINGS  There is no evidence for acute hemorrhage, hydrocephalus, mass lesion, or abnormal extra-axial fluid collection. Patchy low attenuation in the deep hemispheric and periventricular white matter is nonspecific, but likely reflects chronic microvascular ischemic demyelination. Chronic bilateral lacunar infarcts are seen in the basal ganglia. On image the 16, there is a new hypodensity in the left containment which has imaging features compatible with age indeterminate infarct although it does appear new since 09/04/2013. Diffuse loss of parenchymal volume is consistent with atrophy.  The visualized paranasal sinuses and mastoid air cells are clear. No evidence for skull fracture.  CT CERVICAL SPINE FINDINGS  Imaging was obtained from the skullbase through the T1 vertebral body. There is no evidence for an acute fracture. Marked loss of disc height is seen at C2-3. There is loss of disc height with endplate degeneration at C6-7 and to a lesser degree at C5-6. Trace anterolisthesis of C4-5 is compatible with the degree of facet degeneration at this level. Similar trace anterolisthesis is seen at C5-6, stable. There is diffuse facet osteoarthritis throughout bilaterally. No prevertebral soft tissue swelling.  IMPRESSION: Atrophy with chronic small vessel white matter ischemic disease. There is a new (since 09/04/2013) age indeterminate lacunar infarct in the left basal ganglia.  No cervical spine fracture although diffuse degenerative changes are evident.   Electronically Signed   By: Misty Stanley M.D.   On: 04/17/2014 14:31   Ct Cervical Spine Wo Contrast  04/17/2014   CLINICAL DATA:  Initial encounter for fall yesterday with possible slurred speech and left arm weakness.  EXAM: CT  HEAD WITHOUT CONTRAST  CT CERVICAL SPINE WITHOUT CONTRAST  TECHNIQUE: Multidetector CT imaging of the head and cervical spine was performed following the standard protocol without intravenous contrast. Multiplanar CT image reconstructions of the cervical spine were also generated.  COMPARISON:  09/04/2013.  FINDINGS: CT HEAD FINDINGS  There is no evidence for acute hemorrhage, hydrocephalus, mass lesion, or abnormal extra-axial fluid collection. Patchy low attenuation in the deep hemispheric and periventricular white matter is nonspecific, but likely reflects chronic microvascular ischemic demyelination. Chronic bilateral lacunar infarcts are seen in the basal ganglia. On image the 16, there is a new hypodensity in the left containment which has imaging features compatible with age indeterminate infarct although it does appear new since 09/04/2013. Diffuse loss of parenchymal volume is consistent with atrophy.  The visualized paranasal sinuses and mastoid air cells are clear. No evidence for skull fracture.  CT CERVICAL SPINE FINDINGS  Imaging was obtained from the skullbase through the T1 vertebral body. There is no evidence for an acute fracture. Marked loss of disc height  is seen at C2-3. There is loss of disc height with endplate degeneration at C6-7 and to a lesser degree at C5-6. Trace anterolisthesis of C4-5 is compatible with the degree of facet degeneration at this level. Similar trace anterolisthesis is seen at C5-6, stable. There is diffuse facet osteoarthritis throughout bilaterally. No prevertebral soft tissue swelling.  IMPRESSION: Atrophy with chronic small vessel white matter ischemic disease. There is a new (since 09/04/2013) age indeterminate lacunar infarct in the left basal ganglia.  No cervical spine fracture although diffuse degenerative changes are evident.   Electronically Signed   By: Misty Stanley M.D.   On: 04/17/2014 14:31   Carotid Doppler  There is 1-39% bilateral ICA stenosis.  Vertebral artery flow is antegrade.    2D Echocardiogram  EF 50-55% with no source of embolus.    PHYSICAL EXAM Frail elderly Caucasian lady not in distress.Awake alert. Afebrile. Head is nontraumatic. Neck is supple without bruit. Hearing is normal. Cardiac exam no murmur or gallop. Lungs are clear to auscultation. Distal pulses are well felt. Neurological Exam :Awake alert oriented x 2. Diminished attention, registration and recall. Follows only simple midline and one-step commands. Speech slightly dysarthric but can be understood extraocular movements are full range without nystagmus. Blinks to threat bilaterally. Fundi were not visualized.. Mild left lower face asymmetry. Tongue midline. No drift. Mild left hand weakness. Mild diminished fine finger movements on left. Orbits right over left upper extremity. Mild left grip weak.. Normal sensation . Normal coordination.   ASSESSMENT/PLAN Ms. Jolette Lana is a 80 y.o. female with history of hypertension and dementia presenting with dysarthria and left hand weakness. She did not receive IV t-PA due to delay in arrival.   Stroke vs TIA:  Non-dominant right subcortical brain     CT unremarkable for acute abnormality  MRI  pending   MRA  pending   Carotid Doppler  No significant stenosis   2D Echo  No source of embolus    HgbA1c 5.7  Heparin 5000 units sq tid for VTE prophylaxis  General thin liquids  OOB with assistance  no antithrombotics prior to admission, now on aspirin 81 mg orally every day  Ongoing aggressive risk factor management  Resultant dysarthria and left-sided hemiparesis  Therapy recommendations:  SNF  Disposition:  SNF  Hypertension  Stable  Permissive hypertension (OK if < 220/120) but gradually normalize in 5-7 days  Hyperlipidemia  Home meds:  NO STATIN  LDL 167 goal < 70  Recommend the addition of statin or document contraindication  Continue statin at discharge  Other Stroke Risk  Factors Advanced age  Other Active Problems  Baseline dementia  Hospital day # 2 I have personally examined this patient, reviewed notes, independently viewed imaging studies, participated in medical decision making and plan of care. I have made any additions or clarifications directly to the above note. Agree with note above.   Antony Contras, MD Medical Director [facility] Pager: [phone] 04/19/2014 3:03 PM      To contact Stroke Continuity provider, please refer to [email or web address] After hours, contact General Neurology

## 2014-04-20 DIAGNOSIS — F01518 Vascular dementia, unspecified severity, with other behavioral disturbance: Secondary | ICD-10-CM

## 2014-04-20 DIAGNOSIS — I10 Essential (primary) hypertension: Secondary | ICD-10-CM

## 2014-04-20 DIAGNOSIS — I480 Paroxysmal atrial fibrillation: Secondary | ICD-10-CM

## 2014-04-20 DIAGNOSIS — F0151 Vascular dementia with behavioral disturbance: Secondary | ICD-10-CM | POA: Diagnosis present

## 2014-04-20 HISTORY — DX: Essential (primary) hypertension: I10

## 2014-04-20 HISTORY — DX: Vascular dementia, unspecified severity, with other behavioral disturbance: F01.518

## 2014-04-20 HISTORY — DX: Vascular dementia with behavioral disturbance: F01.51

## 2014-04-20 HISTORY — DX: Paroxysmal atrial fibrillation: I48.0

## 2014-04-20 LAB — BASIC METABOLIC PANEL
Anion gap: 13 (ref 5–15)
BUN: 27 mg/dL — ABNORMAL HIGH (ref 6–23)
CALCIUM: 8.8 mg/dL (ref 8.4–10.5)
CO2: 23 meq/L (ref 19–32)
Chloride: 103 mEq/L (ref 96–112)
Creatinine, Ser: 1.15 mg/dL — ABNORMAL HIGH (ref 0.50–1.10)
GFR calc Af Amer: 48 mL/min — ABNORMAL LOW (ref >90)
GFR, EST NON AFRICAN AMERICAN: 42 mL/min — AB (ref >90)
GLUCOSE: 131 mg/dL — AB (ref 70–99)
POTASSIUM: 4 meq/L (ref 3.7–5.3)
Sodium: 139 mEq/L (ref 137–147)

## 2014-04-20 LAB — CBC
HCT: 36.2 % (ref 36.0–46.0)
HEMOGLOBIN: 11.8 g/dL — AB (ref 12.0–15.0)
MCH: 29.2 pg (ref 26.0–34.0)
MCHC: 32.6 g/dL (ref 30.0–36.0)
MCV: 89.6 fL (ref 78.0–100.0)
Platelets: 212 10*3/uL (ref 150–400)
RBC: 4.04 MIL/uL (ref 3.87–5.11)
RDW: 14.6 % (ref 11.5–15.5)
WBC: 7.9 10*3/uL (ref 4.0–10.5)

## 2014-04-20 MED ORDER — ATORVASTATIN CALCIUM 40 MG PO TABS
40.0000 mg | ORAL_TABLET | Freq: Every day | ORAL | Status: DC
  Administered 2014-04-21: 40 mg via ORAL
  Filled 2014-04-20 (×2): qty 1

## 2014-04-20 MED ORDER — METOPROLOL TARTRATE 25 MG PO TABS
25.0000 mg | ORAL_TABLET | Freq: Three times a day (TID) | ORAL | Status: DC
  Administered 2014-04-20 – 2014-04-21 (×4): 25 mg via ORAL
  Filled 2014-04-20 (×4): qty 1

## 2014-04-20 MED ORDER — RIVAROXABAN 15 MG PO TABS
15.0000 mg | ORAL_TABLET | Freq: Every day | ORAL | Status: DC
  Administered 2014-04-20 – 2014-04-21 (×2): 15 mg via ORAL
  Filled 2014-04-20 (×2): qty 1

## 2014-04-20 NOTE — Progress Notes (Signed)
Pt seen and examined with Dr. Ethelene Hal. Please refer to resident note for details.  Pt denies any complaints but also difficult to elicit information secondary to dementia.   Exam:  Gen: awake, alert but confused, NAD  CV: RRR, normal heart sounds  Lungs: CTA b/l  Abd: soft, non tender. BS +  Ext: no edema   Assessment and Plan:  80 y/o female with acute L sided weakness and dysarthria possibly CVA   Left sided weakness:  - Pt with acute CVA likely secondary to afib  - Neuro f/u noted. Start statin today given acute CVA  - MRI noted - acute infarctions in both basal ganglia. No major vessel occlusion  - ASA dc'd. Pt started on xarelto for afib - carotid dopplers noted  - Pt will need SNF on d/c   Tachycardia:  - c/w metoprolol for now. Dose mildly increased today given labile BP - Xarelto started today  Pt stable for d/c to SNF once bed available

## 2014-04-20 NOTE — Progress Notes (Signed)
    SUBJECTIVE:  No pain.  No SOB.  She is confused and pleasant   PHYSICAL EXAM Filed Vitals:   04/19/14 2205 04/20/14 0117 04/20/14 0553 04/20/14 0942  BP: 115/62 109/84 180/98 93/57  Pulse: 79 79 68 79  Temp: 97.8 F (36.6 C) 98.2 F (36.8 C) 98 F (36.7 C) 97.6 F (36.4 C)  TempSrc: Oral Oral Oral Axillary  Resp: 16 16 18 18   SpO2: 98% 95% 95% 96%   General:  No distress Lungs:  Clear Heart:  Regular with ectopy. Abdomen:  Positive bowel sounds, no rebound no guarding Extremities:  No edema   LABS:  Results for orders placed during the hospital encounter of 04/17/14 (from the past 24 hour(s))  CBC     Status: Abnormal   Collection Time    04/20/14  5:45 AM      Result Value Ref Range   WBC 7.9  4.0 - 10.5 K/uL   RBC 4.04  3.87 - 5.11 MIL/uL   Hemoglobin 11.8 (*) 12.0 - 15.0 g/dL   HCT 36.2  36.0 - 46.0 %   MCV 89.6  78.0 - 100.0 fL   MCH 29.2  26.0 - 34.0 pg   MCHC 32.6  30.0 - 36.0 g/dL   RDW 14.6  11.5 - 15.5 %   Platelets 212  150 - 400 K/uL  BASIC METABOLIC PANEL     Status: Abnormal   Collection Time    04/20/14  5:45 AM      Result Value Ref Range   Sodium 139  137 - 147 mEq/L   Potassium 4.0  3.7 - 5.3 mEq/L   Chloride 103  96 - 112 mEq/L   CO2 23  19 - 32 mEq/L   Glucose, Bld 131 (*) 70 - 99 mg/dL   BUN 27 (*) 6 - 23 mg/dL   Creatinine, Ser 1.15 (*) 0.50 - 1.10 mg/dL   Calcium 8.8  8.4 - 10.5 mg/dL   GFR calc non Af Amer 42 (*) >90 mL/min   GFR calc Af Amer 48 (*) >90 mL/min   Anion gap 13  5 - 15    Intake/Output Summary (Last 24 hours) at 04/20/14 1053 Last data filed at 04/20/14 0927  Gross per 24 hour  Intake    360 ml  Output      0 ml  Net    360 ml    ASSESSMENT AND PLAN:  PAF:  Telemetry reviewed 04/20/2014.  We have suggested Xarelto and stop ASA when OK with neurology and HCPOA agrees.  She does have tachybrady with lots of afib.  I am going to increase the metoprolol slightly.    HTN:  BP is very labile.  Continue current  therapy.    CVA:  Anticoagulation as above.  Otherwise plan per neurololgy/primary care.   Jeneen Rinks [facility] 04/20/2014 10:53 AM

## 2014-04-20 NOTE — Evaluation (Signed)
Speech Language Pathology Evaluation Patient Details Name: Cindy Trevino MRN: [id number] DOB: 1924/07/31 Today's Date: 04/20/2014 Time: 2778-2423 SLP Time Calculation (min): 20 min  Problem List:  Patient Active Problem List   Diagnosis Date Noted  . CVA (cerebral infarction) 04/17/2014   Past Medical History:  Past Medical History  Diagnosis Date  . Dementia   . Dementia   . Hypertension    Past Surgical History: History reviewed. No pertinent past surgical history. HPI:  80 yo female admitted to [facility] after having left sided weakness and falling at home = diagnosed with bilateral basal ganglia cvas. PMH + for dementia.   Pt resides with family - per chart review now plan is for SNF.     Assessment / Plan / Recommendation Clinical Impression  Pt presents with severe cognitve linguistic impairments - unknown level of baseline dementia given family not present at this time.  Pt was oriented to self only, followed one step commands intermittently with delays in processing information.  Expressive language limited which may be due to her dementia and not current basal ganglia cva.   Pt named 4/6 objects correctly with delays but does not expound on information when asked questions.    Per nurse technician, pt is not verbalizing needs nor using call bell to heed assistance.  SLP to follow up for family education to maximize cognitive linguistic function.      SLP Assessment  Patient needs continued Speech Lanaguage Pathology Services    Follow Up Recommendations  Skilled Nursing facility    Frequency and Duration min 1 x/week  1 week   Pertinent Vitals/Pain Pain Assessment: No/denies pain   SLP Goals  Patient/Family Stated Goal: none stated Potential to Achieve Goals: Fair Potential Considerations: Previous level of function;Severity of impairments;Ability to learn/carryover information  SLP Evaluation Prior Functioning  Cognitive/Linguistic Baseline: Baseline  deficits Baseline deficit details: unknown level of dementia prior to admit, no family present at this time   Cognition  Overall Cognitive Status: Impaired/Different from baseline Orientation Level: Disoriented to situation;Disoriented to time;Oriented to person;Disoriented to place Attention: Sustained;Focused Focused Attention: Appears intact Sustained Attention: Impaired Sustained Attention Impairment: Functional basic;Verbal basic Memory: Impaired Awareness: Impaired Problem Solving: Impaired Problem Solving Impairment: Functional basic (pt not using call bell for assist per nurse technician) Safety/Judgment: Impaired    Comprehension  Auditory Comprehension Overall Auditory Comprehension: Impaired Yes/No Questions: Impaired Basic Biographical Questions: 76-100% accurate Basic Immediate Environment Questions: 75-100% accurate Commands: Impaired One Step Basic Commands: 50-74% accurate Conversation: Simple Interfering Components: Attention;Processing speed;Working Curator: Not tested Reading Comprehension Reading Status: Impaired Word level: Impaired (pt did not read single word outloud but nodded head yes to indicate correct word for item) Sentence Level: Not tested Paragraph Level: Not tested Functional Environmental (signs, name badge): Not tested Interfering Components: Attention    Expression Expression Primary Mode of Expression: Verbal Verbal Expression Overall Verbal Expression: Impaired Initiation: Impaired Automatic Speech: Counting;Month of year (counting independently, months of year - even with max verbal/visual cues) Level of Generative/Spontaneous Verbalization: Word Repetition: Impaired Level of Impairment: Word level Naming: Impairment Responsive: 76-100% accurate Confrontation: Impaired (4/6 correctly named) Pragmatics: Impairment Impairments: Abnormal affect;Eye contact Interfering Components:  Attention Effective Techniques: Phonemic cues;Articulatory cues Non-Verbal Means of Communication: Not applicable Other Verbal Expression Comments: pt verbalizes inconsistently  Written Expression Dominant Hand: Right Written Expression: Not tested   Oral / Motor Oral Motor/Sensory Function Overall Oral Motor/Sensory Function:  (no gross focal cranial nerve deficits-open mouth posture noted) Motor  Speech Overall Motor Speech: Impaired Respiration: Impaired Level of Impairment: Phrase Phonation: Low vocal intensity Articulation: Within functional limitis Intelligibility: Intelligible Motor Planning: Not tested   Seligman, [facility] SLP [phone]

## 2014-04-20 NOTE — ED Provider Notes (Signed)
Medical screening examination/treatment/procedure(s) were performed by non-physician practitioner and as supervising physician I was immediately available for consultation/collaboration.   EKG Interpretation   Date/Time:  Monday April 17 2014 12:39:27 EDT Ventricular Rate:  71 PR Interval:  150 QRS Duration: 88 QT Interval:  444 QTC Calculation: 482 R Axis:   44 Text Interpretation:  Sinus rhythm Low voltage, extremity leads  Nonspecific T wave abnormality Lateral leads Baseline wander When compared  with ECG of 09/04/2013 No significant change was found Confirmed by [facility]   MD, Evan Mackie ([id number]) on 04/17/2014 1:06:15 PM        Francine Graven, DO 04/20/14 1732

## 2014-04-20 NOTE — Progress Notes (Signed)
STROKE TEAM PROGRESS NOTE   HISTORY Cindy Trevino is an 80 y.o. female with a past medical history significant for HTN and dementia, brought in by EMS for further evaluation of dysarthria and left-hand weakness. Cindy Trevino is a the bedside and said that patient's home aide found her on the floor and she was noted to be " little altered" with spurred speech and left hand weakness around 10:30 or 11 am today. Patient's grandson said that she is also noticing some droopiness of the left corner of her mouth. Cindy Trevino said that " I just can not use the left hand".Unsure of LSN, possibly Friday. Denies HA, vertigo, double vision, difficulty swallowing, or visual disturbances. CT brain revealed " a new (since 09/04/2013) age indeterminate lacunar infarct in the left basal ganglia". Patient was not administered TPA secondary to delay in arrival. She was admitted for further evaluation and treatment.   SUBJECTIVE (INTERVAL HISTORY) Patient up in chair at bedside. Pleasant. Still with left hand hemiparesis.   OBJECTIVE Temp:  [97.6 F (36.4 C)-98.4 F (36.9 C)] 97.6 F (36.4 C) (10/22 0942) Pulse Rate:  [57-79] 79 (10/22 0942) Cardiac Rhythm:  [-] Sinus tachycardia (10/21 2150) Resp:  [16-18] 18 (10/22 0942) BP: (93-180)/(44-98) 93/57 mmHg (10/22 0942) SpO2:  [95 %-98 %] 96 % (10/22 0942)   Recent Labs Lab 04/17/14 1338  GLUCAP 90    Recent Labs Lab 04/17/14 1313 04/19/14 0452 04/20/14 0545  NA 140 142 139  K 3.7 4.0 4.0  CL 101 103 103  CO2 24 23 23   GLUCOSE 98 95 131*  BUN 20 28* 27*  CREATININE 1.23* 1.36* 1.15*  CALCIUM 9.5 9.0 8.8   No results found for this basename: AST, ALT, ALKPHOS, BILITOT, PROT, ALBUMIN,  in the last 168 hours  Recent Labs Lab 04/17/14 1313 04/19/14 0452 04/20/14 0545  WBC 6.6 8.5 7.9  NEUTROABS 4.7  --   --   HGB 13.6 12.7 11.8*  HCT 40.4 38.6 36.2  MCV 88.0 90.8 89.6  PLT 241 226 212    Recent Labs Lab 04/17/14 1313  CKTOTAL 65   TROPONINI <0.30   No results found for this basename: LABPROT, INR,  in the last 72 hours  Recent Labs  04/17/14 1340  COLORURINE YELLOW  LABSPEC 1.015  PHURINE 6.0  GLUCOSEU NEGATIVE  HGBUR NEGATIVE  BILIRUBINUR NEGATIVE  KETONESUR NEGATIVE  PROTEINUR NEGATIVE  UROBILINOGEN 0.2  NITRITE NEGATIVE  LEUKOCYTESUR NEGATIVE       Component Value Date/Time   CHOL 227* 04/18/2014 0710   TRIG 102 04/18/2014 0710   HDL 40 04/18/2014 0710   CHOLHDL 5.7 04/18/2014 0710   VLDL 20 04/18/2014 0710   LDLCALC 167* 04/18/2014 0710   Lab Results  Component Value Date   HGBA1C 5.7* 04/18/2014   No results found for this basename: labopia,  cocainscrnur,  labbenz,  amphetmu,  thcu,  labbarb    No results found for this basename: ETH,  in the last 168 hours  Mr [facility] Wo Contrast  04/19/2014   CLINICAL DATA:  History of dementia up. New onset dysarthria and left hand weakness.  EXAM: MRI HEAD WITHOUT CONTRAST  MRA HEAD WITHOUT CONTRAST  TECHNIQUE: Multiplanar, multiecho pulse sequences of the brain and surrounding structures were obtained without intravenous contrast. Angiographic images of the head were obtained using MRA technique without contrast.  COMPARISON:  Head CT 04/17/2014 and previous  FINDINGS: MRI HEAD FINDINGS  Diffusion imaging shows a small cluster of acute infarctions  within the left putamen. There is a linear acute infarction affecting the posterior right putamen extending into the corona radiata. No swelling, hemorrhage or mass effect. No other acute infarction. There chronic small-vessel ischemic changes of the pons. There are a few old small vessel cerebellar infarctions with hemosiderin deposition. The cerebral hemispheres show old lacunar infarction in the left thalamus and chronic small vessel change throughout the cerebral hemispheric white matter. There are scattered foci of hemosiderin deposition related to the old infarctions.  No mass lesion, hydrocephalus or  extra-axial collection. No pituitary mass. No inflammatory sinus disease.  MRA HEAD FINDINGS  Both internal carotid arteries are widely patent into the brain. The anterior and middle cerebral vessels are normal without proximal stenosis, aneurysm or vascular malformation.  Left vertebral artery is a large vessel that supplies the basilar. There is atherosclerotic narrowing and irregularity distally but no flow-limiting stenosis. The right vertebral artery is a tiny vessel that terminates in PICA. No basilar stenosis. Both superior cerebellar and posterior cerebral arteries are patent but show considerable atherosclerotic irregularity.  IMPRESSION: Acute infarctions in both basal ganglia. No mass effect or acute hemorrhage.  Old small vessel infarctions elsewhere throughout the brain, some associated with hemosiderin deposition.  Distal vessel atherosclerotic irregularity, particularly evident in the posterior circulation branches.  No major vessel occlusion. Atherosclerotic disease at both distal vertebral arteries.   Electronically Signed   By: Nelson Chimes M.D.   On: 04/19/2014 15:15   Mr Brain Wo Contrast  04/19/2014   CLINICAL DATA:  History of dementia up. New onset dysarthria and left hand weakness.  EXAM: MRI HEAD WITHOUT CONTRAST  MRA HEAD WITHOUT CONTRAST  TECHNIQUE: Multiplanar, multiecho pulse sequences of the brain and surrounding structures were obtained without intravenous contrast. Angiographic images of the head were obtained using MRA technique without contrast.  COMPARISON:  Head CT 04/17/2014 and previous  FINDINGS: MRI HEAD FINDINGS  Diffusion imaging shows a small cluster of acute infarctions within the left putamen. There is a linear acute infarction affecting the posterior right putamen extending into the corona radiata. No swelling, hemorrhage or mass effect. No other acute infarction. There chronic small-vessel ischemic changes of the pons. There are a few old small vessel cerebellar  infarctions with hemosiderin deposition. The cerebral hemispheres show old lacunar infarction in the left thalamus and chronic small vessel change throughout the cerebral hemispheric white matter. There are scattered foci of hemosiderin deposition related to the old infarctions.  No mass lesion, hydrocephalus or extra-axial collection. No pituitary mass. No inflammatory sinus disease.  MRA HEAD FINDINGS  Both internal carotid arteries are widely patent into the brain. The anterior and middle cerebral vessels are normal without proximal stenosis, aneurysm or vascular malformation.  Left vertebral artery is a large vessel that supplies the basilar. There is atherosclerotic narrowing and irregularity distally but no flow-limiting stenosis. The right vertebral artery is a tiny vessel that terminates in PICA. No basilar stenosis. Both superior cerebellar and posterior cerebral arteries are patent but show considerable atherosclerotic irregularity.  IMPRESSION: Acute infarctions in both basal ganglia. No mass effect or acute hemorrhage.  Old small vessel infarctions elsewhere throughout the brain, some associated with hemosiderin deposition.  Distal vessel atherosclerotic irregularity, particularly evident in the posterior circulation branches.  No major vessel occlusion. Atherosclerotic disease at both distal vertebral arteries.   Electronically Signed   By: Nelson Chimes M.D.   On: 04/19/2014 15:15   Carotid Doppler  There is 1-39% bilateral ICA stenosis. Vertebral artery  flow is antegrade.    2D Echocardiogram  EF 50-55% with no source of embolus.    PHYSICAL EXAM Frail elderly Caucasian lady not in distress.Awake alert. Afebrile. Head is nontraumatic. Neck is supple without bruit. Hearing is normal. Cardiac exam no murmur or gallop. Lungs are clear to auscultation. Distal pulses are well felt. Neurological Exam :Awake alert oriented x 2. Diminished attention, registration and recall. Follows only simple  midline and one-step commands. Speech slightly dysarthric but can be understood extraocular movements are full range without nystagmus. Blinks to threat bilaterally. Fundi were not visualized.. Mild left lower face asymmetry. Tongue midline. No drift. Mild left hand weakness. Mild diminished fine finger movements on left. Orbits right over left upper extremity. Mild left grip weak.. Normal sensation . Normal coordination.   ASSESSMENT/PLAN Ms. Michala Deblanc is a 80 y.o. female with history of hypertension and dementia presenting with dysarthria and left hand weakness. She did not receive IV t-PA due to delay in arrival.   Stroke:  Bilateral basal ganglia infarcts most likely secondary to small vessel disease, however, given new onset atrial fibrillation and CHAD2VasC score > 2, agree with anticoagulation  CT unremarkable for acute abnormality  MRI  Bilateral basal ganglia infarcts. small vessel disease   MRA  No major vessel occlusion.   Carotid Doppler  No significant stenosis   2D Echo  No source of embolus    HgbA1c 5.7  Heparin 5000 units sq tid for VTE prophylaxis  General thin liquids  OOB with assistance  no antithrombotics prior to admission, now on aspirin 81 mg orally every day. Agree with cardiology recommendations for anticoagulation with xarelto. Ok to start from the stroke perspective.  Ongoing aggressive risk factor management  Resultant dysarthria and left-sided hemiparesis  Therapy recommendations:  SNF  Disposition:  SNF  Hypertension  Stable  Permissive hypertension (OK if < 220/120) but gradually normalize in 5-7 days  Hyperlipidemia  Home meds:  NO STATIN  LDL 167 goal < 70  Recommend the addition of statin or document contraindication. This is a required Joint Commission core measure.  Continue statin at discharge  Other Stroke Risk Factors Advanced age  Other Active Problems  Baseline dementia  Hospital day # 3  No further stroke  workup indicated.  Patient has a 10-15% risk of having another stroke over the next year, the highest risk is within 2 weeks of the most recent stroke/TIA (risk of having a stroke following a stroke or TIA is the same).  Ongoing risk factor control by Primary Care Physician  Stroke Service will sign off. Please call should any needs arise.  Follow up with Dr. [facility] in 1 month, order placed.   Burnetta Sabin, MSN, RN, ANVP-BC, ANP-BC, [facility] Pager: [phone] 04/20/2014 10:51 AM  I have personally examined this patient, reviewed notes, independently viewed imaging studies, participated in medical decision making and plan of care. I have made any additions or clarifications directly to the above note. Agree with note above.    Antony Contras, MD Medical Director [facility] Pager: [phone] 04/20/2014 3:09 PM      To contact Stroke Continuity provider, please refer to [email or web address] After hours, contact General Neurology

## 2014-04-20 NOTE — Progress Notes (Signed)
Physical Therapy Treatment Patient Details Name: Cindy Trevino MRN: [id number] DOB: January 14, 1925 Today's Date: 04/20/2014    History of Present Illness 80 y.o. female with hx of dementia and HTN. Pt admitted for Lt side weakness, slurred speech, and AMS after possibly experiencing a fall. CT showed a new (since 08/2013) lacunar infarct in the Lt basal ganglia, but would not explain symptoms. MRI has been ordered.    PT Comments    Pt was in bed when PT arrived and is very lethargic but awoke with verbal cues from PT.  Has a limited abiltiy to control movement now and is usually home with family.  Recommending CIR due to her level of involvement and need for inpatient assistance to succeed at home.  Follow Up Recommendations  CIR;Supervision/Assistance - 24 hour     Equipment Recommendations  None recommended by PT    Recommendations for Other Services Rehab consult     Precautions / Restrictions Precautions Precautions: Fall Restrictions Weight Bearing Restrictions: No    Mobility  Bed Mobility Overal bed mobility: Needs Assistance Bed Mobility: Sidelying to Sit     Supine to sit: Max assist;HOB elevated     General bed mobility comments: Minimal initation by pt to sequence and come up to sit, but did slightly lift her head and shoulders to sit up upon request  Transfers Overall transfer level: Needs assistance Equipment used: 1 person hand held assist Transfers: Sit to/from [facility] Sit to Stand: Total assist Stand pivot transfers: Total assist       General transfer comment: Pt is distracted and then focused to transfer but did not assist in a meaningful way.  Mainly she supports a bit on RLE and otherwise no concerted effort to help pivot or stand  Ambulation/Gait             General Gait Details: unable   Stairs            Wheelchair Mobility    Modified Rankin (Stroke Patients Only) Modified Rankin (Stroke Patients  Only) Pre-Morbid Rankin Score:  (unknown) Modified Rankin: Severe disability     Balance Overall balance assessment: Needs assistance Sitting-balance support: Feet supported;Single extremity supported Sitting balance-Leahy Scale: Poor Sitting balance - Comments: cues to sequence and control trunk Postural control: Posterior lean Standing balance support: Bilateral upper extremity supported Standing balance-Leahy Scale: Poor Standing balance comment: low effort either from cognition or stroke deficits                    Cognition Arousal/Alertness: Awake/alert Behavior During Therapy: Flat affect Overall Cognitive Status: Impaired/Different from baseline Area of Impairment: Orientation;Attention;Memory;Following commands;Awareness;Problem solving Orientation Level: Place;Time;Situation Current Attention Level: Alternating Memory: Decreased short-term memory Following Commands: Follows one step commands inconsistently;Follows one step commands with increased time Safety/Judgement: Decreased awareness of deficits Awareness: Anticipatory Problem Solving: Slow processing;Decreased initiation;Difficulty sequencing;Requires verbal cues;Requires tactile cues General Comments: Pt was EOB with PT when medical students came in and became extremely distracted.  Unable to focus on the task of coordinating sitting control but finally did use RUE to support     Exercises General Exercises - Lower Extremity Long Arc Quad: AAROM;Both;10 reps Hip ABduction/ADduction: AAROM;Both;10 reps    General Comments        Pertinent Vitals/Pain Pain Assessment: No/denies pain    Home Living                      Prior Function  PT Goals (current goals can now be found in the care plan section) Acute Rehab PT Goals Patient Stated Goal: none stated, very limited verbalization Progress towards PT goals: Progressing toward goals    Frequency  Min 3X/week    PT Plan  Current plan remains appropriate    Co-evaluation             End of Session   Activity Tolerance: Patient tolerated treatment well;Other (comment) (cognition versus true deficits) Patient left: in chair;with call bell/phone within reach     Time: 0902-0926 PT Time Calculation (min): 24 min  Charges:  $Therapeutic Activity: 23-37 mins                    G Codes:      Ramond Dial 2014/05/09, 9:38 AM Mee Hives, PT MS Acute Rehab Dept. Number: [phone]

## 2014-04-20 NOTE — Progress Notes (Signed)
Rehab Admissions Coordinator Note:  Patient was screened by Retta Diones for appropriateness for an Inpatient Acute Rehab Consult.  At this time, we are recommending [facility].  Per Education officer, museum, family requesting SNF placement.  If this changes, then could consider inpatient rehab consult.  Call me for questions.  Retta Diones 04/20/2014, 10:45 AM  I can be reached at [phone].

## 2014-04-20 NOTE — Clinical Social Work Placement (Signed)
Clinical Social Work Department CLINICAL SOCIAL WORK PLACEMENT NOTE 04/20/2014  Patient:  Cindy Trevino, Cindy Trevino  Account Number:  [id number] Admit date:  04/17/2014  Clinical Social Worker:  Glendon Axe, CLINICAL SOCIAL WORKER  Date/time:  04/20/2014 10:02 AM  Clinical Social Work is seeking post-discharge placement for this patient at the following level of care:   [place]   (*CSW will update this form in Epic as items are completed)   04/20/2014  Patient/family provided with [place] Department of Clinical Social Work's list of facilities offering this level of care within the geographic area requested by the patient (or if unable, by the patient's family).  04/20/2014  Patient/family informed of their freedom to choose among providers that offer the needed level of care, that participate in Medicare, Medicaid or managed care program needed by the patient, have an available bed and are willing to accept the patient.  04/20/2014  Patient/family informed of [facility]' ownership interest in [facility], as well as of the fact that they are under no obligation to receive care at this facility.  PASARR submitted to EDS on 04/20/2014 PASARR number received on 04/20/2014  FL2 transmitted to all facilities in geographic area requested by pt/family on  04/20/2014 FL2 transmitted to all facilities within larger geographic area on   Patient informed that his/her managed care company has contracts with or will negotiate with  certain facilities, including the following:   Yes     Patient/family informed of bed offers received:  04/20/2014 Patient chooses bed at  Physician recommends and patient chooses bed at    Patient to be transferred to  on   Patient to be transferred to facility by  Patient and family notified of transfer on  Name of family member notified:    The following physician request were entered in Epic:   Additional Comments:   Glendon Axe, MSW,  [place] [phone] 04/20/2014 10:03 AM

## 2014-04-20 NOTE — Progress Notes (Deleted)
Clinical Social Worker presented bed offers to pt and pt's husband and son. Pt's husband now desires placement at [facility]. Pt's husband reported [facility] being closer to pt's home. CSW contacted [facility] for pending bed, facility will keep CSW updated on pending nurse evaluation/ bed availability. CSW remains available for continued support and to facilitate pt's discharge needs once medically stable.   Glendon Axe, MSW, LCSWA [phone] 04/20/2014 10:54 AM

## 2014-04-20 NOTE — Clinical Social Work Psychosocial (Addendum)
Clinical Social Work Department BRIEF PSYCHOSOCIAL ASSESSMENT 04/20/2014  Patient:  Cindy Trevino, Cindy Trevino     Account Number:  [id number]     Admit date:  04/17/2014  Clinical Social Worker:  Glendon Axe, CLINICAL SOCIAL WORKER  Date/Time:  04/20/2014 09:52 AM  Referred by:  Physician  Date Referred:  04/18/2014 Referred for  SNF Placement   Other Referral:   Interview type:  Other - See comment Other interview type:   Clinical Social Worker spoke with pt's grandson, Jeanelle Dake via telephone.    PSYCHOSOCIAL DATA Living Status:  FAMILY Admitted from facility:   Level of care:   Primary support name:  Valeria, Boza [phone] Primary support relationship to patient:  NONE Degree of support available:   Strong    CURRENT CONCERNS Current Concerns  Post-Acute Placement   Other Concerns:    SOCIAL WORK ASSESSMENT / PLAN CSW spoke with pt's grandson, Darrell at length in reference to post-acute placement for SNF. CSW explained SNF process and pt's grandson and family agreeable to SNF plan. Pt's grandson also reported pt has never been admitted into SNF before. Pt's grandson also reported pt lives with him. CSW attempted to contact pt's daughter, Aydia Maj as well. CSW will continue to provide support and facilitate discharge needs once pt is medically stable.   Assessment/plan status:  Psychosocial Support/Ongoing Assessment of Needs Other assessment/ plan:   Information/referral to community resources:   CSW to leave SNF list with bed offers for pt's grandson in pt's room.    PATIENT'S/FAMILY'S RESPONSE TO PLAN OF CARE: Pt lying in bed, oriented. Pt's grandson and family agreeable to SNF placement. CSW remains available.       Glendon Axe, MSW, LCSWA [phone] 04/20/2014 10:01 AM

## 2014-04-20 NOTE — Progress Notes (Signed)
Subjective: Cindy Trevino. I had a family meeting with her HCPOW (photocopy of paperwork in chart) daughter in law Chicken. We discussed Ms [facility] medical condition and the pros and cons of starting her on xarelto anticoagulation. Vaughan Basta says she is leaning towards beginning xarelto but wanted to think it over last night. I said I would speak to her today to see what her decision is. We also discussed goals of care. Vaughan Basta shared that Ms Terry had previous said she did not want any painful resuscitation or intensive care. Vaughan Basta decided it would be in the patient's best interests to be DNR and we completed paperwork now in the chart to change her from full code to DNR.  When we rounded on Ms Fraga this morning, she was with PT who had her sit up and transfer to the side of the bed. The patient was not very responsive or conversant but appeared in a good mood and said she felt much better. She would not answer review of system questions.   Objective: Vital signs in last 24 hours: Filed Vitals:   04/19/14 2205 04/20/14 0117 04/20/14 0553 04/20/14 0942  BP: 115/62 109/84 180/98 93/57  Pulse: 79 79 68 79  Temp: 97.8 F (36.6 C) 98.2 F (36.8 C) 98 F (36.7 C) 97.6 F (36.4 C)  TempSrc: Oral Oral Oral Axillary  Resp: 16 16 18 18   SpO2: 98% 95% 95% 96%   Weight change:   Intake/Output Summary (Last 24 hours) at 04/20/14 1020 Last data filed at 04/20/14 6333  Gross per 24 hour  Intake    360 ml  Output      0 ml  Net    360 ml    General Appearance: Sitting up in bed, not answering any orientation questions but alert and pleasant, attempts to cooperate, frail but no distress, appears stated age  80: Normocephalic, without obvious abnormality, atraumatic Back: Kyphotic  Lungs: Very poor effort but appears to be clear to auscultation bilaterally, respirations unlabored Heart: regular rate and rhythm, S1 and S2 normal, no murmur, rub or gallop  Extremities: Extremities normal, atraumatic, no  cyanosis or edema  Pulses: 2+ and symmetric all extremities  Neurologic: limited by patient cooperation, 5/5 grip in R hand, 4/5 in L hand, able to transfer to side of bed with PT assistance  Lab Results: Basic Metabolic Panel:  Recent Labs Lab 04/19/14 0452 04/20/14 0545  NA 142 139  K 4.0 4.0  CL 103 103  CO2 23 23  GLUCOSE 95 131*  BUN 28* 27*  CREATININE 1.36* 1.15*  CALCIUM 9.0 8.8  CBC:  Recent Labs Lab 04/17/14 1313 04/19/14 0452 04/20/14 0545  WBC 6.6 8.5 7.9  NEUTROABS 4.7  --   --   HGB 13.6 12.7 11.8*  HCT 40.4 38.6 36.2  MCV 88.0 90.8 89.6  PLT 241 226 212   Cardiac Enzymes:  Recent Labs Lab 04/17/14 1313  CKTOTAL 65  TROPONINI <0.30   CBG:  Recent Labs Lab 04/17/14 1338  GLUCAP 90   Hemoglobin A1C:  Recent Labs Lab 04/18/14 0710  HGBA1C 5.7*   Fasting Lipid Panel:  Recent Labs Lab 04/18/14 0710  CHOL 227*  HDL 40  LDLCALC 167*  TRIG 102  CHOLHDL 5.7  Urinalysis:  Recent Labs Lab 04/17/14 1340  COLORURINE YELLOW  LABSPEC 1.015  PHURINE 6.0  GLUCOSEU NEGATIVE  HGBUR NEGATIVE  BILIRUBINUR NEGATIVE  KETONESUR NEGATIVE  PROTEINUR NEGATIVE  UROBILINOGEN 0.2  NITRITE NEGATIVE  LEUKOCYTESUR NEGATIVE  Echo: Left ventricle: The cavity size was normal. Systolic function was normal. The estimated ejection fraction was in the range of 50% to 55%. Wall motion was normal; there were no regional wall motion abnormalities. Doppler parameters are consistent with abnormal left ventricular relaxation (grade 1 diastolic dysfunction). Doppler parameters are consistent with high ventricular filling pressure. - Aortic valve: There was mild regurgitation. - Mitral valve: Calcified annulus. Mildly thickened leaflets . There was mild regurgitation.  Carotid Dopplers: Findings suggest 1-39% internal carotid artery stenosis bilaterally. Vertebral arteries are patent with antegrade flow.   Studies/Results: Mr Virgel Paling Wo  Contrast  04/19/2014   CLINICAL DATA:  History of dementia up. New onset dysarthria and left hand weakness.  EXAM: MRI HEAD WITHOUT CONTRAST  MRA HEAD WITHOUT CONTRAST  TECHNIQUE: Multiplanar, multiecho pulse sequences of the brain and surrounding structures were obtained without intravenous contrast. Angiographic images of the head were obtained using MRA technique without contrast.  COMPARISON:  Head CT 04/17/2014 and previous  FINDINGS: MRI HEAD FINDINGS  Diffusion imaging shows a small cluster of acute infarctions within the left putamen. There is a linear acute infarction affecting the posterior right putamen extending into the corona radiata. No swelling, hemorrhage or mass effect. No other acute infarction. There chronic small-vessel ischemic changes of the pons. There are a few old small vessel cerebellar infarctions with hemosiderin deposition. The cerebral hemispheres show old lacunar infarction in the left thalamus and chronic small vessel change throughout the cerebral hemispheric white matter. There are scattered foci of hemosiderin deposition related to the old infarctions.  No mass lesion, hydrocephalus or extra-axial collection. No pituitary mass. No inflammatory sinus disease.  MRA HEAD FINDINGS  Both internal carotid arteries are widely patent into the brain. The anterior and middle cerebral vessels are normal without proximal stenosis, aneurysm or vascular malformation.  Left vertebral artery is a large vessel that supplies the basilar. There is atherosclerotic narrowing and irregularity distally but no flow-limiting stenosis. The right vertebral artery is a tiny vessel that terminates in PICA. No basilar stenosis. Both superior cerebellar and posterior cerebral arteries are patent but show considerable atherosclerotic irregularity.  IMPRESSION: Acute infarctions in both basal ganglia. No mass effect or acute hemorrhage.  Old small vessel infarctions elsewhere throughout the brain, some  associated with hemosiderin deposition.  Distal vessel atherosclerotic irregularity, particularly evident in the posterior circulation branches.  No major vessel occlusion. Atherosclerotic disease at both distal vertebral arteries.   Electronically Signed   By: Nelson Chimes M.D.   On: 04/19/2014 15:15   Mr Brain Wo Contrast  04/19/2014   CLINICAL DATA:  History of dementia up. New onset dysarthria and left hand weakness.  EXAM: MRI HEAD WITHOUT CONTRAST  MRA HEAD WITHOUT CONTRAST  TECHNIQUE: Multiplanar, multiecho pulse sequences of the brain and surrounding structures were obtained without intravenous contrast. Angiographic images of the head were obtained using MRA technique without contrast.  COMPARISON:  Head CT 04/17/2014 and previous  FINDINGS: MRI HEAD FINDINGS  Diffusion imaging shows a small cluster of acute infarctions within the left putamen. There is a linear acute infarction affecting the posterior right putamen extending into the corona radiata. No swelling, hemorrhage or mass effect. No other acute infarction. There chronic small-vessel ischemic changes of the pons. There are a few old small vessel cerebellar infarctions with hemosiderin deposition. The cerebral hemispheres show old lacunar infarction in the left thalamus and chronic small vessel change throughout the cerebral hemispheric white matter. There are scattered foci of hemosiderin  deposition related to the old infarctions.  No mass lesion, hydrocephalus or extra-axial collection. No pituitary mass. No inflammatory sinus disease.  MRA HEAD FINDINGS  Both internal carotid arteries are widely patent into the brain. The anterior and middle cerebral vessels are normal without proximal stenosis, aneurysm or vascular malformation.  Left vertebral artery is a large vessel that supplies the basilar. There is atherosclerotic narrowing and irregularity distally but no flow-limiting stenosis. The right vertebral artery is a tiny vessel that  terminates in PICA. No basilar stenosis. Both superior cerebellar and posterior cerebral arteries are patent but show considerable atherosclerotic irregularity.  IMPRESSION: Acute infarctions in both basal ganglia. No mass effect or acute hemorrhage.  Old small vessel infarctions elsewhere throughout the brain, some associated with hemosiderin deposition.  Distal vessel atherosclerotic irregularity, particularly evident in the posterior circulation branches.  No major vessel occlusion. Atherosclerotic disease at both distal vertebral arteries.   Electronically Signed   By: Nelson Chimes M.D.   On: 04/19/2014 15:15   Medications: I have reviewed the patient's current medications. Scheduled Meds: . aspirin  81 mg Oral Daily  . heparin  5,000 Units Subcutaneous 3 times per day  . metoprolol tartrate  25 mg Oral BID   Continuous Infusions:  PRN Meds:. Assessment/Plan: Active Problems:   CVA (cerebral infarction)  #Left-sided Weakness: Ms Mccarrell stroke work-up notable for acute infarcts in both basal ganglia with several previous small infarcts throughout the brain on MRI with negative carotid doppler and non-contributory echo. Paroxysmal atrial fibrillation noted on telemetry and likely responsible for infarctions. Cards recommends xarelto renally dosed at 15 mg if neuro agrees as her chadsvasc score is 6. HCPOW daughter Vaughan Basta said yesterday she thinks she wants to start anticoagulation but will think it over and I will check with her today. PT/OT recommends SNF.  -appreciate neurology, cards, PT/OT  -check with HCPOW and neuro about starting xarelto 15 mg daily - ASA 81 daily -cardiac monitoring  -PT/OT  -PCP may decide to start statin on follow-up  #HTN: BP meds were intially held for permissive HTN in setting of possible stroke.We restarted her metoprolol in setting of a fib but are holding her lisinopril for now and will reassess. I reconciled medications with Vaughan Basta who says all she was taking  at home was metoprolol and lisinopril. Her most recent BP was 93/57 so will cont to hold lisinopril and monitor. -cont metoprolol tartrate 25 mg po BID and hold other BP for now -cont to reassess  #Dementia: Patient alert and oriented to person and place but not date or why she is here. [name] says she is at baseline. Likely secondary to chronic microvascular ischemic demyelination and chronic bilateral lacunar infarcts on head CT. After reconciling medications with daughter in law, she is not currently on donepezil and zoloft so she never received it here.  -Appreciate neuro consult   #Diet: regular  #VTE PPX: heparin 5000 u O'Brien  Dispo: Disposition is deferred at this time, awaiting improvement of current medical problems.  Anticipated discharge in approximately 0-1 day(s), pending SNF placement.   The patient does not know have a current PCP (No primary provider on file.) and does need an [facility] hospital follow-up appointment after discharge.  The patient does not know have transportation limitations that hinder transportation to clinic appointments.  .Services Needed at time of discharge: Y = Yes, Blank = No PT: Y  OT: Y  RN:   Equipment:   Other:     LOS: 3  days   Kelby Aline, MD 04/20/2014, 10:20 AM

## 2014-04-20 NOTE — Discharge Summary (Signed)
Name: Cindy Trevino MRN: [id number] DOB: 03-31-1925 80 y.o. PCP: No primary provider on file.  Date of Admission: 04/17/2014 12:22 PM Date of Discharge: 04/21/2014 Attending Physician: Cindy Contes, MD  Discharge Diagnosis:  Active Problems:   CVA (cerebral infarction)   Essential hypertension   Vascular dementia   Atrial fibrillation  Discharge Medications:   Medication List    STOP taking these medications       lisinopril 10 MG tablet  Commonly known as:  PRINIVIL,ZESTRIL     metoprolol succinate 25 MG 24 hr tablet  Commonly known as:  TOPROL-XL      TAKE these medications       atorvastatin 40 MG tablet  Commonly known as:  LIPITOR  Take 1 tablet (40 mg total) by mouth daily at 6 PM.     metoprolol tartrate 25 MG tablet  Commonly known as:  LOPRESSOR  Take 1 tablet (25 mg total) by mouth 3 (three) times daily.     Rivaroxaban 15 MG Tabs tablet  Commonly known as:  XARELTO  Take 1 tablet (15 mg total) by mouth daily with supper.        Disposition and follow-up:   [name] was discharged from [facility] in Stable condition.  At the hospital follow up visit please address:  1.  Neuro functioning compared to baseline, BP and antihypertensives, long term placement, titration of statin  2.  Labs / imaging needed at time of follow-up: none  3.  Pending labs/ test needing follow-up: none  Follow-up Appointments: Follow-up Information   Follow up with Cindy L, MD On 04/28/2014. (@ 9:30 am)    Specialty:  Family Medicine   Contact information:   [address] [place] [id number] [phone]       Follow up with SETHI,PRAMOD, MD In 1 month. (Stroke Clinic, Office will call you with appointment date & time)    Specialties:  Neurology, Radiology   Contact information:   [place] [id number] [phone]       Discharge Instructions: Discharge Instructions   Ambulatory  referral to Neurology    Complete by:  As directed   Stroke patient. Dr. Leonie Trevino prefers follow up in 1 month     Diet - low sodium heart healthy    Complete by:  As directed      Increase activity slowly    Complete by:  As directed            Consultations: Treatment Team:  Rounding Lbcardiology, MD  Procedures Performed:  Dg Chest 2 View  04/17/2014   CLINICAL DATA:  Altered mental status.  EXAM: CHEST  2 VIEW  COMPARISON:  None.  FINDINGS: The heart size and mediastinal contours are within normal limits. Both lungs are clear. No pneumothorax or pleural effusion is noted. The visualized skeletal structures are unremarkable.  IMPRESSION: No acute cardiopulmonary abnormality seen.   Electronically Signed   By: Cindy Trevino M.D.   On: 04/17/2014 14:30   Dg Lumbar Spine Complete  04/17/2014   CLINICAL DATA:  Altered mental status.  EXAM: LUMBAR SPINE - COMPLETE 4+ VIEW  COMPARISON:  None.  FINDINGS: No fracture is noted. Grade 1 anterolisthesis of L3-4 is noted secondary to posterior facet joint hypertrophy. Moderate degenerative disc disease is noted at this level. Severe degenerative disc disease is noted at L4-5 and L5-S1. Lumbarized S1 vertebral body is noted.  IMPRESSION: Severe multilevel degenerative disc disease is  noted. No acute abnormality seen in the lumbar spine.   Electronically Signed   By: Cindy Trevino M.D.   On: 04/17/2014 14:34   Ct Head Wo Contrast  04/17/2014   CLINICAL DATA:  Initial encounter for fall yesterday with possible slurred speech and left arm weakness.  EXAM: CT HEAD WITHOUT CONTRAST  CT CERVICAL SPINE WITHOUT CONTRAST  TECHNIQUE: Multidetector CT imaging of the head and cervical spine was performed following the standard protocol without intravenous contrast. Multiplanar CT image reconstructions of the cervical spine were also generated.  COMPARISON:  09/04/2013.  FINDINGS: CT HEAD FINDINGS  There is no evidence for acute hemorrhage, hydrocephalus, mass lesion,  or abnormal extra-axial fluid collection. Patchy low attenuation in the deep hemispheric and periventricular white matter is nonspecific, but likely reflects chronic microvascular ischemic demyelination. Chronic bilateral lacunar infarcts are seen in the basal ganglia. On image the 16, there is a new hypodensity in the left containment which has imaging features compatible with age indeterminate infarct although it does appear new since 09/04/2013. Diffuse loss of parenchymal volume is consistent with atrophy.  The visualized paranasal sinuses and mastoid air cells are clear. No evidence for skull fracture.  CT CERVICAL SPINE FINDINGS  Imaging was obtained from the skullbase through the T1 vertebral body. There is no evidence for an acute fracture. Marked loss of disc height is seen at C2-3. There is loss of disc height with endplate degeneration at C6-7 and to a lesser degree at C5-6. Trace anterolisthesis of C4-5 is compatible with the degree of facet degeneration at this level. Similar trace anterolisthesis is seen at C5-6, stable. There is diffuse facet osteoarthritis throughout bilaterally. No prevertebral soft tissue swelling.  IMPRESSION: Atrophy with chronic small Cindy Trevino white matter ischemic disease. There is a new (since 09/04/2013) age indeterminate lacunar infarct in the left basal ganglia.  No cervical spine fracture although diffuse degenerative changes are evident.   Electronically Signed   By: Cindy Trevino M.D.   On: 04/17/2014 14:31   Ct Cervical Spine Wo Contrast  04/17/2014   CLINICAL DATA:  Initial encounter for fall yesterday with possible slurred speech and left arm weakness.  EXAM: CT HEAD WITHOUT CONTRAST  CT CERVICAL SPINE WITHOUT CONTRAST  TECHNIQUE: Multidetector CT imaging of the head and cervical spine was performed following the standard protocol without intravenous contrast. Multiplanar CT image reconstructions of the cervical spine were also generated.  COMPARISON:  09/04/2013.   FINDINGS: CT HEAD FINDINGS  There is no evidence for acute hemorrhage, hydrocephalus, mass lesion, or abnormal extra-axial fluid collection. Patchy low attenuation in the deep hemispheric and periventricular white matter is nonspecific, but likely reflects chronic microvascular ischemic demyelination. Chronic bilateral lacunar infarcts are seen in the basal ganglia. On image the 16, there is a new hypodensity in the left containment which has imaging features compatible with age indeterminate infarct although it does appear new since 09/04/2013. Diffuse loss of parenchymal volume is consistent with atrophy.  The visualized paranasal sinuses and mastoid air cells are clear. No evidence for skull fracture.  CT CERVICAL SPINE FINDINGS  Imaging was obtained from the skullbase through the T1 vertebral body. There is no evidence for an acute fracture. Marked loss of disc height is seen at C2-3. There is loss of disc height with endplate degeneration at C6-7 and to a lesser degree at C5-6. Trace anterolisthesis of C4-5 is compatible with the degree of facet degeneration at this level. Similar trace anterolisthesis is seen at C5-6, stable. There  is diffuse facet osteoarthritis throughout bilaterally. No prevertebral soft tissue swelling.  IMPRESSION: Atrophy with chronic small Cindy Trevino white matter ischemic disease. There is a new (since 09/04/2013) age indeterminate lacunar infarct in the left basal ganglia.  No cervical spine fracture although diffuse degenerative changes are evident.   Electronically Signed   By: Cindy Trevino M.D.   On: 04/17/2014 14:31   Mr Jodene Nam Head Wo Contrast  04/19/2014   CLINICAL DATA:  History of dementia up. New onset dysarthria and left hand weakness.  EXAM: MRI HEAD WITHOUT CONTRAST  MRA HEAD WITHOUT CONTRAST  TECHNIQUE: Multiplanar, multiecho pulse sequences of the brain and surrounding structures were obtained without intravenous contrast. Angiographic images of the head were obtained using  MRA technique without contrast.  COMPARISON:  Head CT 04/17/2014 and previous  FINDINGS: MRI HEAD FINDINGS  Diffusion imaging shows a small cluster of acute infarctions within the left putamen. There is a linear acute infarction affecting the posterior right putamen extending into the corona radiata. No swelling, hemorrhage or mass effect. No other acute infarction. There chronic small-Cindy Trevino ischemic changes of the pons. There are a few old small Cindy Trevino cerebellar infarctions with hemosiderin deposition. The cerebral hemispheres show old lacunar infarction in the left thalamus and chronic small Cindy Trevino change throughout the cerebral hemispheric white matter. There are scattered foci of hemosiderin deposition related to the old infarctions.  No mass lesion, hydrocephalus or extra-axial collection. No pituitary mass. No inflammatory sinus disease.  MRA HEAD FINDINGS  Both internal carotid arteries are widely patent into the brain. The anterior and middle cerebral vessels are normal without proximal stenosis, aneurysm or vascular malformation.  Left vertebral artery is a large Cindy Trevino that supplies the basilar. There is atherosclerotic narrowing and irregularity distally but no flow-limiting stenosis. The right vertebral artery is a tiny Cindy Trevino that terminates in PICA. No basilar stenosis. Both superior cerebellar and posterior cerebral arteries are patent but show considerable atherosclerotic irregularity.  IMPRESSION: Acute infarctions in both basal ganglia. No mass effect or acute hemorrhage.  Old small Cindy Trevino infarctions elsewhere throughout the brain, some associated with hemosiderin deposition.  Distal Cindy Trevino atherosclerotic irregularity, particularly evident in the posterior circulation branches.  No major Cindy Trevino occlusion. Atherosclerotic disease at both distal vertebral arteries.   Electronically Signed   By: Nelson Chimes M.D.   On: 04/19/2014 15:15   2D Echo:  04/18/14 - Left ventricle: The cavity size was  normal. Systolic function was normal. The estimated ejection fraction was in the range of 50% to 55%. Wall motion was normal; there were no regional wall motion abnormalities. Doppler parameters are consistent with abnormal left ventricular relaxation (grade 1 diastolic dysfunction). Doppler parameters are consistent with high ventricular filling pressure. - Aortic valve: There was mild regurgitation. - Mitral valve: Calcified annulus. Mildly thickened leaflets . There was mild regurgitation.  Admission HPI: Ms Cindy Trevino is an 80 year old woman with dementia, HTN, and remote hx of breast cancer who presents with Trevino sided weakness. History was limited as the patient has dementia and her grandson who was in ED was not present earlier in day and was unsure of the details. The patient is unaware of why she is in the hospital. The patient was seen at her baseline at around 10 am by another grandson and was then found at 11 am by her home nurse aid. There are conflicting reports about whether she had fallen but she was allegedly altered above her baseline. She also complains of Trevino hand and  leg weakness. The grandson said she struggled to hold a sandwich when she was trying to eat it. He is unsure if she has had any slurred speech. He says she is at her baseline mental status and is unaware of any recent medical issues. She has never had an episode like this before. She denied any type of pain including chest, abdominal, head, or extremity, as well as fever, cough, dysuria, polyuria, diarrhea.   Hospital Course by problem list: Active Problems:   CVA (cerebral infarction)   Essential hypertension   Vascular dementia   Atrial fibrillation   [name] Cindy Trevino is an 80 year old female with a PMH of HTN, dementia, and breast cancer who was admitted for confusion, slurred speech and new left hand weakness on 04/17/14. She was diagnosed with atrial fibrillation and found to have multiple small strokes including  bilateral acute strokes in her basal ganglia as well as multiple old strokes upon workup.   #CVA: Ms. Cindy Trevino stroke work-up was notable for acute infarcts in both basal ganglia with several previous small infarcts throughout the brain on MRI. She had a negative carotid doppler study and echocardiogram notable for EF 32-02%, grade 1 diastolic dysfunction, and mild aortic valve regurgitation. Paroxysmal atrial fibrillation was noted on telemetry and is likely responsible for infarctions. She received a ASA 324 mg on admission and then was started on daily 81 mg tablets of aspirin. Ms Cindy Trevino was on telemetry which noted paroxysmal atrial fibrillation. Cardiology was consulted and recommended anticoagulation with Xarelto renally dosed at 15 mg (her chadsvasc score is 6). ASA was d/c when patient began xarelto. We also started atorvastatin 40 mg which can be considered by PCP. Both family and neurology agrees with this. PT/OT recommended CIR who evaluated the patient and felt SNF is more appropriate.   #Paroxysmal Atrial Fibrillation: Cardiac monitoring revealed numerous episodes of atrial fibrillation noted on telemetry. She was subsequently restarted on her home metoprolol and cardiology recommended beginning renal dosing of Xarelto (15 mg po daily) pending neurology's approval. Ms. Cindy Trevino daughter in law and health care power of attorney, Cindy Trevino, was informed of all of the risks and benefits anticoagulation. Cindy Trevino agreed that Ms. Cindy Trevino should be started on Xarelto which was begun prior to discharge. Cardiology increased coreg to TID which was continued on discharge.  #HTN: Ms. Cadet blood pressure medications were initially held for permissive HTN in setting of possible stroke. Her HCPOW, Cindy Trevino, helped [place] reconcile her medications and said all she was taking at home was metoprolol and lisinopril. We restarted her metoprolol in setting of atrial fibrillation but we continued to hold lisinopril. Prior to  discharge, cardiology increased her metoprolol tartrate to 25 mg TID. PCP should reassess BP and appropriate regimen.   #Dementia: During her hospital stay, Ms. Cindy Trevino's mental status was altered. It varied depending on the day whether or not see was oriented to person, place or time. Both her grandson and daughter in law, Cindy Trevino confirmed that this was her baseline. We determined that Ms. Cindy Trevino's dementia is most likely secondary to chronic microvascular ischemic demyelination and chronic bilateral lacunar infarcts on head CT, likely secondary to a fib. Ms. Cindy Trevino was found to have two files in our system with different medications on them. After reconciling medications with daughter in law, it was confirmed that she is not currently on Donepezil and Zoloft and never received it during hospitalization. We also had a goals of care meeting with Cindy Trevino who felt that Ms Cindy Trevino would not  want an aggressive resuscitation and we changed the patient's code status to DO NOT RESUSCITATE. Documentation is in her paper chart.  #Hyperlipidemia: Lipid panel notable for cholesterol 227, LDL 167. Cardiology recommends initiating statin. We began atorvastatin 40 mg po daily. Patient previously was on statin but d/ced by PCP when trying to cut down on medications. Given new CVA, we started statin and PCP can consider whether to continue considering goals of care.  Discharge Vitals:   BP 135/57  Pulse 63  Temp(Src) 97.5 F (36.4 C) (Oral)  Resp 18  Wt 146 lb 12.8 oz (66.588 kg)  SpO2 98%  Discharge Physical Exam: General Appearance: Lying in bed, oriented to person but not answering other orientation questions, alert and pleasant, attempts to cooperate, frail but no distress, appears stated age  HEENT: Normocephalic, without obvious abnormality, atraumatic  Back: Kyphotic  Lungs: Very poor effort but appears to be clear to auscultation bilaterally, respirations unlabored  Heart: regular rate and rhythm, S1 and S2  normal, no murmur, rub or gallop  Extremities: Extremities normal, atraumatic, no cyanosis or edema  Pulses: 2+ and symmetric all extremities  Neurologic: limited by patient cooperation, CNII-XII intact 5/5 grip in R hand, 4/5 in Trevino hand   Discharge Labs:  Basic Metabolic Panel:  Recent Labs Lab 04/19/14 0452 04/20/14 0545  NA 142 139  K 4.0 4.0  CL 103 103  CO2 23 23  GLUCOSE 95 131*  BUN 28* 27*  CREATININE 1.36* 1.15*  CALCIUM 9.0 8.8   CBC:  Recent Labs Lab 04/17/14 1313 04/19/14 0452 04/20/14 0545  WBC 6.6 8.5 7.9  NEUTROABS 4.7  --   --   HGB 13.6 12.7 11.8*  HCT 40.4 38.6 36.2  MCV 88.0 90.8 89.6  PLT 241 226 212   Cardiac Enzymes:  Recent Labs Lab 04/17/14 1313  CKTOTAL 65  TROPONINI <0.30   CBG:  Recent Labs Lab 04/17/14 1338  GLUCAP 90   Hemoglobin A1C:  Recent Labs Lab 04/18/14 0710  HGBA1C 5.7*   Fasting Lipid Panel:  Recent Labs Lab 04/18/14 0710  CHOL 227*  HDL 40  LDLCALC 167*  TRIG 102  CHOLHDL 5.7  Urinalysis:  Recent Labs Lab 04/17/14 1340  COLORURINE YELLOW  LABSPEC 1.015  PHURINE 6.0  GLUCOSEU NEGATIVE  HGBUR NEGATIVE  BILIRUBINUR NEGATIVE  KETONESUR NEGATIVE  PROTEINUR NEGATIVE  UROBILINOGEN 0.2  NITRITE NEGATIVE  LEUKOCYTESUR NEGATIVE   Misc. Labs: Lactic acid 0.2  Signed: Kelby Aline, MD 04/21/2014, 11:16 AM    Services Ordered on Discharge: none Equipment Ordered on Discharge: none

## 2014-04-21 DIAGNOSIS — I4891 Unspecified atrial fibrillation: Secondary | ICD-10-CM

## 2014-04-21 DIAGNOSIS — E785 Hyperlipidemia, unspecified: Secondary | ICD-10-CM

## 2014-04-21 MED ORDER — METOPROLOL TARTRATE 25 MG PO TABS
25.0000 mg | ORAL_TABLET | Freq: Three times a day (TID) | ORAL | Status: DC
Start: 2014-04-21 — End: 2016-12-07

## 2014-04-21 MED ORDER — RIVAROXABAN 15 MG PO TABS: 15.0000 mg | ORAL_TABLET | Freq: Every day | ORAL | Status: DC

## 2014-04-21 MED ORDER — ATORVASTATIN CALCIUM 40 MG PO TABS
40.0000 mg | ORAL_TABLET | Freq: Every day | ORAL | Status: DC
Start: 2014-04-21 — End: 2017-03-06

## 2014-04-21 NOTE — Progress Notes (Signed)
I agree with the following treatment note after reviewing documentation.   Alvine Mostafa, Brynn OTR/L Pager: [phone] Office: [phone] .   

## 2014-04-21 NOTE — Progress Notes (Signed)
Subjective: NAEON. We started xarelto and atorvastatin yesterday. Social work received several offers for a SNF and family needs to decide which to go to on discharge today. Ms Falk had no complaints this morning. She said she had no pain but was difficult to converse with given dementia.      Objective: Vital signs in last 24 hours: Filed Vitals:   04/20/14 2123 04/21/14 0210 04/21/14 0500 04/21/14 1010  BP: 165/87 156/97 158/94 135/57  Pulse: 62 63 64 63  Temp: 97.8 F (36.6 C) 97.9 F (36.6 C) 98.3 F (36.8 C) 97.5 F (36.4 C)  TempSrc: Oral Oral Oral Oral  Resp: 18 18 18 18   Weight:      SpO2: 95% 95% 96% 98%   Weight change:   Intake/Output Summary (Last 24 hours) at 04/21/14 1047 Last data filed at 04/21/14 0920  Gross per 24 hour  Intake    460 ml  Output      0 ml  Net    460 ml    General Appearance: Lying in bed, oriented to person but not answering other orientation questions, alert and pleasant, attempts to cooperate, frail but no distress, appears stated age  HEENT: Normocephalic, without obvious abnormality, atraumatic Back: Kyphotic  Lungs: Very poor effort but appears to be clear to auscultation bilaterally, respirations unlabored Heart: regular rate and rhythm, S1 and S2 normal, no murmur, rub or gallop  Extremities: Extremities normal, atraumatic, no cyanosis or edema  Pulses: 2+ and symmetric all extremities  Neurologic: limited by patient cooperation, CNII-XII intact 5/5 grip in R hand, 4/5 in L hand  Lab Results: Basic Metabolic Panel:  Recent Labs Lab 04/19/14 0452 04/20/14 0545  NA 142 139  K 4.0 4.0  CL 103 103  CO2 23 23  GLUCOSE 95 131*  BUN 28* 27*  CREATININE 1.36* 1.15*  CALCIUM 9.0 8.8  CBC:  Recent Labs Lab 04/17/14 1313 04/19/14 0452 04/20/14 0545  WBC 6.6 8.5 7.9  NEUTROABS 4.7  --   --   HGB 13.6 12.7 11.8*  HCT 40.4 38.6 36.2  MCV 88.0 90.8 89.6  PLT 241 226 212   Cardiac Enzymes:  Recent Labs Lab  04/17/14 1313  CKTOTAL 65  TROPONINI <0.30   CBG:  Recent Labs Lab 04/17/14 1338  GLUCAP 90   Hemoglobin A1C:  Recent Labs Lab 04/18/14 0710  HGBA1C 5.7*   Fasting Lipid Panel:  Recent Labs Lab 04/18/14 0710  CHOL 227*  HDL 40  LDLCALC 167*  TRIG 102  CHOLHDL 5.7  Urinalysis:  Recent Labs Lab 04/17/14 1340  COLORURINE YELLOW  LABSPEC 1.015  PHURINE 6.0  GLUCOSEU NEGATIVE  HGBUR NEGATIVE  BILIRUBINUR NEGATIVE  KETONESUR NEGATIVE  PROTEINUR NEGATIVE  UROBILINOGEN 0.2  NITRITE NEGATIVE  LEUKOCYTESUR NEGATIVE   Echo: Left ventricle: The cavity size was normal. Systolic function was normal. The estimated ejection fraction was in the range of 50% to 55%. Wall motion was normal; there were no regional wall motion abnormalities. Doppler parameters are consistent with abnormal left ventricular relaxation (grade 1 diastolic dysfunction). Doppler parameters are consistent with high ventricular filling pressure. - Aortic valve: There was mild regurgitation. - Mitral valve: Calcified annulus. Mildly thickened leaflets . There was mild regurgitation.  Carotid Dopplers: Findings suggest 1-39% internal carotid artery stenosis bilaterally. Vertebral arteries are patent with antegrade flow.   Studies/Results: Mr Virgel Paling Wo Contrast  04/19/2014   CLINICAL DATA:  History of dementia up. New onset dysarthria and left hand  weakness.  EXAM: MRI HEAD WITHOUT CONTRAST  MRA HEAD WITHOUT CONTRAST  TECHNIQUE: Multiplanar, multiecho pulse sequences of the brain and surrounding structures were obtained without intravenous contrast. Angiographic images of the head were obtained using MRA technique without contrast.  COMPARISON:  Head CT 04/17/2014 and previous  FINDINGS: MRI HEAD FINDINGS  Diffusion imaging shows a small cluster of acute infarctions within the left putamen. There is a linear acute infarction affecting the posterior right putamen extending into the corona radiata. No  swelling, hemorrhage or mass effect. No other acute infarction. There chronic small-vessel ischemic changes of the pons. There are a few old small vessel cerebellar infarctions with hemosiderin deposition. The cerebral hemispheres show old lacunar infarction in the left thalamus and chronic small vessel change throughout the cerebral hemispheric white matter. There are scattered foci of hemosiderin deposition related to the old infarctions.  No mass lesion, hydrocephalus or extra-axial collection. No pituitary mass. No inflammatory sinus disease.  MRA HEAD FINDINGS  Both internal carotid arteries are widely patent into the brain. The anterior and middle cerebral vessels are normal without proximal stenosis, aneurysm or vascular malformation.  Left vertebral artery is a large vessel that supplies the basilar. There is atherosclerotic narrowing and irregularity distally but no flow-limiting stenosis. The right vertebral artery is a tiny vessel that terminates in PICA. No basilar stenosis. Both superior cerebellar and posterior cerebral arteries are patent but show considerable atherosclerotic irregularity.  IMPRESSION: Acute infarctions in both basal ganglia. No mass effect or acute hemorrhage.  Old small vessel infarctions elsewhere throughout the brain, some associated with hemosiderin deposition.  Distal vessel atherosclerotic irregularity, particularly evident in the posterior circulation branches.  No major vessel occlusion. Atherosclerotic disease at both distal vertebral arteries.   Electronically Signed   By: Nelson Chimes M.D.   On: 04/19/2014 15:15   Mr Brain Wo Contrast  04/19/2014   CLINICAL DATA:  History of dementia up. New onset dysarthria and left hand weakness.  EXAM: MRI HEAD WITHOUT CONTRAST  MRA HEAD WITHOUT CONTRAST  TECHNIQUE: Multiplanar, multiecho pulse sequences of the brain and surrounding structures were obtained without intravenous contrast. Angiographic images of the head were obtained  using MRA technique without contrast.  COMPARISON:  Head CT 04/17/2014 and previous  FINDINGS: MRI HEAD FINDINGS  Diffusion imaging shows a small cluster of acute infarctions within the left putamen. There is a linear acute infarction affecting the posterior right putamen extending into the corona radiata. No swelling, hemorrhage or mass effect. No other acute infarction. There chronic small-vessel ischemic changes of the pons. There are a few old small vessel cerebellar infarctions with hemosiderin deposition. The cerebral hemispheres show old lacunar infarction in the left thalamus and chronic small vessel change throughout the cerebral hemispheric white matter. There are scattered foci of hemosiderin deposition related to the old infarctions.  No mass lesion, hydrocephalus or extra-axial collection. No pituitary mass. No inflammatory sinus disease.  MRA HEAD FINDINGS  Both internal carotid arteries are widely patent into the brain. The anterior and middle cerebral vessels are normal without proximal stenosis, aneurysm or vascular malformation.  Left vertebral artery is a large vessel that supplies the basilar. There is atherosclerotic narrowing and irregularity distally but no flow-limiting stenosis. The right vertebral artery is a tiny vessel that terminates in PICA. No basilar stenosis. Both superior cerebellar and posterior cerebral arteries are patent but show considerable atherosclerotic irregularity.  IMPRESSION: Acute infarctions in both basal ganglia. No mass effect or acute hemorrhage.  Old small  vessel infarctions elsewhere throughout the brain, some associated with hemosiderin deposition.  Distal vessel atherosclerotic irregularity, particularly evident in the posterior circulation branches.  No major vessel occlusion. Atherosclerotic disease at both distal vertebral arteries.   Electronically Signed   By: Nelson Chimes M.D.   On: 04/19/2014 15:15   Medications: I have reviewed the patient's current  medications. Scheduled Meds: . atorvastatin  40 mg Oral q1800  . metoprolol tartrate  25 mg Oral TID  . rivaroxaban  15 mg Oral Q supper   Continuous Infusions:  PRN Meds:. Assessment/Plan: Active Problems:   CVA (cerebral infarction)   Essential hypertension   Vascular dementia   Atrial fibrillation  #Left-sided Weakness: Ms Pixler stroke work-up notable for acute infarcts in both basal ganglia with several previous small infarcts throughout the brain on MRI with negative carotid doppler and non-contributory echo. Paroxysmal atrial fibrillation noted on telemetry and likely responsible for infarctions. Started xarelto 15 mg daily yesterday per cards and neuro recs as her chadsvasc score is 6. Original recommendation was CIR but CIR eval recommended SNF. Waiting for HCPOW daughter in law Vaughan Basta to select SNF today. We also started atorvastatin 40 mg yesterday but recommend PCP consider appropriate dosage or need given goals of care -appreciate neurology, cards, PT/OT  -f/u on SNF placement - xarelto 15 mg daily -atorvastatin 40 mg daily -cardiac monitoring  -PT/OT  -PCP may decide to change statin on follow-up  #HTN: BP meds were intially held for permissive HTN in setting of possible stroke.We restarted her metoprolol in setting of a fib but are holding her lisinopril for now and will reassess. Cardiology went up on metoprolol to 25 mg TID given lability yesterday. Overnight it as 150s-160s/80s-90s. I reconciled medications with Vaughan Basta who says all she was taking at home was metoprolol and lisinopril.  -cont metoprolol tartrate 25 mg po TID and hold other BP for now -cont to reassess  #Dementia: Patient alert and oriented to person and place but not date or why she is here. Grandson and daughter in law say she is at baseline. Likely secondary to chronic microvascular ischemic demyelination and chronic bilateral lacunar infarcts on head C and MRI.  -Appreciate neuro consult    #Hyperlipidemia:Lipid panel notable for cholesterol 227, LDL 167. Cardiology recommends initiating statin. We began atorvastatin 40 mg po daily. Patient previously was on statin but d/ced by PCP when trying to cut down on medications. Given new CVA, we started statin and PCP can consider whether to continue considering goals of care. -cont atorvastatin 40 mg daily  #Diet: regular  #VTE PPX: heparin 5000 u Bruno  Dispo: Disposition is deferred at this time, awaiting improvement of current medical problems.  Anticipated discharge in approximately today pending SNF  The patient does not know have a current PCP (No primary provider on file.) and does need an [facility] hospital follow-up appointment after discharge.  The patient does not know have transportation limitations that hinder transportation to clinic appointments.  .Services Needed at time of discharge: Y = Yes, Blank = No PT: Y  OT: Y  RN:   Equipment:   Other:     LOS: 4 days   Kelby Aline, MD 04/21/2014, 10:47 AM

## 2014-04-21 NOTE — Discharge Instructions (Signed)
It was a pleasure to meet you during your hospitalization. You were treated at [facility] for a stroke. Please return to the ED or seek medical attention if you have new or worsening numbness, weakness, slurred speech, or other worrisome medical condition.

## 2014-04-21 NOTE — Progress Notes (Signed)
Pt seen and examined with Dr. Ethelene Hal. Please refer to resident note for details.   Pt denies any complaints but also difficult to elicit information secondary to dementia.   Exam:  Gen: awake, alert but confused, NAD  CV: RRR, normal heart sounds  Lungs: CTA b/l  Abd: soft, non tender. BS +  Ext: no edema   Assessment and Plan:  80 y/o female with acute L sided weakness and dysarthria possibly CVA   Left sided weakness:  - Pt with acute CVA likely secondary to afib  - Pt to f/u with neuro as outpatient in 4 weeks  - MRI with acute infarctions in both basal ganglia. No major vessel occlusion  - C/w xarelto for afib  - c/w statin - carotid dopplers noted  - Pt for discharge to SNF today  Tachycardia secondary to paroxysmal afib:  - c/w metoprolol at current dose. Pt is in SNR now  - Xarelto started today   Pt stable for d/c to SNF today

## 2014-04-21 NOTE — Progress Notes (Signed)
Pt to be discharged to nursing facility tonight. Report called in to nurse at facility.

## 2014-04-21 NOTE — Discharge Summary (Signed)
INTERNAL MEDICINE ATTENDING DISCHARGE COSIGN   I evaluated the patient on the day of discharge and discussed the discharge plan with my resident team. I agree with the discharge documentation and disposition.   Neils Siracusa 04/21/2014, 12:21 PM

## 2014-04-21 NOTE — Progress Notes (Signed)
UR complete.  Kashena Novitski RN, MSN 

## 2014-04-21 NOTE — Progress Notes (Signed)
Clinical Social Worker facilitated patient discharge including contacting patient family and facility to confirm patient discharge plans.  Clinical information faxed to facility and family agreeable with plan.  CSW arranged ambulance transport via [facility] for 4 PM  to [facility].  RN to call report prior to discharge.  Clinical Social Worker will sign off for now as social work intervention is no longer needed. Please consult [place] again if new need arises.  Glendon Axe, [place]

## 2014-04-21 NOTE — Progress Notes (Signed)
Occupational Therapy Treatment Patient Details Name: Cindy Trevino MRN: [id number] DOB: 1924-12-15 Today's Date: 04/21/2014    History of present illness 80 y.o. female with hx of dementia and HTN. Pt admitted for Lt side weakness, slurred speech, and AMS after possibly experiencing a fall. CT showed a new (since 08/2013) lacunar infarcts in bil basal ganglia, but would not explain symptoms. MRI has been ordered.   OT comments  Pt was very happy and compliant during today's session. Pt able and willing to follow simple commands and initiated some movement during bed mobility. We were able to complete toilet transfer using bear hug techniques tot +1 (A) and sliding BSC behind and lowering onto it. Pt was left in chair in doorway to allow for people watching with the hope of keeping pt engaged. OT will follow acutely.    Follow Up Recommendations  SNF;Supervision/Assistance - 24 hour    Equipment Recommendations  None recommended by OT    Recommendations for Other Services      Precautions / Restrictions Precautions Precautions: Fall Restrictions Weight Bearing Restrictions: No       Mobility Bed Mobility Overal bed mobility: Needs Assistance Bed Mobility: Supine to Sit     Supine to sit: +2 for physical assistance;Max assist     General bed mobility comments: Pt demonstrating mod initiation to sequence and sit up. Pt lifted head and shoulders, began moving legs towards EOB, and intiated pushing up off bed. Pt needed mod +2 (A) for boost to come to sit and to fully descend bil LE from bed. Pt needed max (A) and verbal cues to progress to EOB.   Transfers Overall transfer level: Needs assistance Equipment used: 2 person hand held assist Transfers: Sit to/from Stand Sit to Stand: Total assist;+2 physical assistance         General transfer comment: x2 attempts to stand tot +2 (A) with no success. 3rd attempt to stand used bear hug technique tot +1 (A) and slid BSC beneath  her and lowered onto it. Pt not demonstrating active assist during sit to stand transfers. Pt demonstrating active assist with bil UE during bear hug standing techniques.    Balance Overall balance assessment: Needs assistance Sitting-balance support: Bilateral upper extremity supported;Feet supported Sitting balance-Leahy Scale: Poor Sitting balance - Comments: verbal cues to sit up straight   Standing balance support: Bilateral upper extremity supported Standing balance-Leahy Scale: Zero Standing balance comment: Pt unable to fully reach standing position to assess balance                   ADL Overall ADL's : Needs assistance/impaired                         Toilet Transfer: Total assistance;+2 for physical assistance;Squat-pivot;BSC Toilet Transfer Details (indicate cue type and reason): Tot +2 (A) for boost to stand, trunk support, and balance support. Once standing from EOB, BSC slid in behind pt due to generalized weakness in bil LE and cognitive deficits impeding ability to initiate and sequence movement. Toileting- Clothing Manipulation and Hygiene: Total assistance;+2 for physical assistance;Sit to/from stand Toileting - Clothing Manipulation Details (indicate cue type and reason): Tot +2 (A) to manipulate clothing, complete sit to stand, and peri care due to generalized weakness and cognitive deficits. Pt was prompted to give therapist a hug in order to stand which pt seemed to really like and respond to. Pt had good UE strength during "hug/stand"  Functional mobility during ADLs: Total assistance;+2 for physical assistance        Vision                     Perception     Praxis      Cognition   Behavior During Therapy: WFL for tasks assessed/performed Overall Cognitive Status: History of cognitive impairments - at baseline Area of Impairment: Orientation;Attention;Memory;Safety/judgement;Awareness;Problem solving Orientation Level:  Disoriented to;Place;Situation Current Attention Level: Sustained Memory: Decreased short-term memory    Safety/Judgement: Decreased awareness of safety;Decreased awareness of deficits Awareness:  (Emerging intellectual) Problem Solving: Slow processing;Decreased initiation;Difficulty sequencing;Requires verbal cues;Requires tactile cues General Comments: Pt was very happy during todays session. Pt was able to follow commands consistently and sustained attention for longer period of time than previous session.    Extremity/Trunk Assessment               Exercises     Shoulder Instructions       General Comments      Pertinent Vitals/ Pain       Pain Assessment: No/denies pain  Home Living                                          Prior Functioning/Environment              Frequency Min 2X/week     Progress Toward Goals  OT Goals(current goals can now be found in the care plan section)     Acute Rehab OT Goals Patient Stated Goal: none stated, very limited verbalization Time For Goal Achievement: 05/02/14 Potential to Achieve Goals: Fair ADL Goals Pt Will Perform Grooming: with min assist;sitting Pt Will Perform Upper Body Bathing: with min assist;sitting Pt Will Transfer to Toilet: with mod assist;stand pivot transfer;bedside commode;with +2 assist  Plan      Co-evaluation                 End of Session Equipment Utilized During Treatment: Gait belt   Activity Tolerance Patient tolerated treatment well   Patient Left in chair;Other (comment) (in doorway (likes people watching at home))   Nurse Communication Mobility status;Precautions;Other (comment) (Pt in chair in doorway; back to bed at 12:30)        Time: 5643-3295 OT Time Calculation (min): 27 min  Charges:    Redmond Baseman 04/21/2014, 11:38 AM

## 2014-04-25 ENCOUNTER — Encounter (INDEPENDENT_AMBULATORY_CARE_PROVIDER_SITE_OTHER): Payer: Self-pay | Admitting: Surgery

## 2014-04-26 ENCOUNTER — Non-Acute Institutional Stay (SKILLED_NURSING_FACILITY): Payer: PRIVATE HEALTH INSURANCE | Admitting: Internal Medicine

## 2014-04-26 DIAGNOSIS — I480 Paroxysmal atrial fibrillation: Secondary | ICD-10-CM

## 2014-04-26 DIAGNOSIS — F0150 Vascular dementia without behavioral disturbance: Secondary | ICD-10-CM

## 2014-04-26 DIAGNOSIS — I10 Essential (primary) hypertension: Secondary | ICD-10-CM

## 2014-04-26 DIAGNOSIS — E785 Hyperlipidemia, unspecified: Secondary | ICD-10-CM

## 2014-04-26 NOTE — Progress Notes (Signed)
MRN: [id number] Name: Cindy Trevino  Sex: female Age: 80 y.o. DOB: [birth year]  [place] #: [facility] Facility/Room: 101 Level Of Care: SNF Provider: Inocencio Trevino [facility] Emergency Contacts: Extended Emergency Contact Information Primary Emergency Contact: Cindy Trevino Address: [address], [place] Phone: [phone] Relation: Daughter  Code Status: DNR  Allergies: Review of patient's allergies indicates no known allergies.  Chief Complaint  Patient presents with  . New Admit To SNF    HPI: Patient is 80 y.o. female who is admitted to SNF s/p a basal ganglia stroke.  Past Medical History  Diagnosis Date  . COPD (chronic obstructive pulmonary disease)   . History of breast cancer     L breast  . Arthritis   . Dementia   . Dementia   . Hypertension   . Hyperlipidemia     Past Surgical History  Procedure Laterality Date  . Breast lumpectomy      left breast      Medication List       This list is accurate as of: 04/26/14 11:59 PM.  Always use your most recent med list.               amLODipine 5 MG tablet  Commonly known as:  NORVASC  Take 5 mg by mouth daily.     aspirin 81 MG tablet  Take 81 mg by mouth daily.     atorvastatin 40 MG tablet  Commonly known as:  LIPITOR  Take 1 tablet (40 mg total) by mouth daily at 6 PM.     BENICAR HCT 40-12.5 MG per tablet  Generic drug:  olmesartan-hydrochlorothiazide     CALCIUM CITRATE + PO  Take by mouth daily.     donepezil 10 MG tablet  Commonly known as:  ARICEPT     metoprolol tartrate 25 MG tablet  Commonly known as:  LOPRESSOR  Take 12.5 mg by mouth 2 (two) times daily.     metoprolol tartrate 25 MG tablet  Commonly known as:  LOPRESSOR  Take 1 tablet (25 mg total) by mouth 3 (three) times daily.     Rivaroxaban 15 MG Tabs tablet  Commonly known as:  XARELTO  Take 1 tablet (15 mg total) by mouth daily with supper.     sertraline 100 MG tablet   Commonly known as:  ZOLOFT  Take 100 mg by mouth daily.     simvastatin 40 MG tablet  Commonly known as:  ZOCOR  Take 40 mg by mouth every evening.     VITAMIN Trevino-3 PO  Take 5,000 mg by mouth daily.        No orders of the defined types were placed in this encounter.     There is no immunization history on file for this patient.  History  Substance Use Topics  . Smoking status: Never Smoker   . Smokeless tobacco: Not on file  . Alcohol Use: No    Family history is noncontributory    Review of Systems   UTO 2/2 dementia;nursing voice no concerns.     Filed Vitals:   04/26/14 2152  BP: 142/68  Pulse: 78  Temp: 98 F (36.7 C)  Resp: 18    Physical Exam  GENERAL APPEARANCE: Alert, conversant,  No acute distress.  SKIN: No diaphoresis rash HEAD: Normocephalic, atraumatic  EYES: Conjunctiva/lids clear. Pupils round, reactive. EOMs intact.  EARS: External exam WNL, canals  clear. Hearing grossly normal.  NOSE: No deformity or discharge.  MOUTH/THROAT: Lips w/o lesions  RESPIRATORY: Breathing is even, unlabored. Lung sounds are clear   CARDIOVASCULAR: Heart RRR no murmurs, rubs or gallops. No peripheral edema.   GASTROINTESTINAL: Abdomen is soft, non-tender, not distended w/ normal bowel sounds. GENITOURINARY: Bladder non tender, not distended  MUSCULOSKELETAL: No abnormal joints or musculature NEUROLOGIC:  Cranial nerves 2-12 grossly intact. Moves all extremities  PSYCHIATRIC: dementia,pleasant, no behavioral issues  Patient Active Problem List   Diagnosis Date Noted  . Hyperlipidemia   . Essential hypertension 04/20/2014  . Vascular dementia 04/20/2014  . Paroxysmal atrial fibrillation 04/20/2014  . Basal ganglia infarction 04/17/2014  . History of breast cancer 08/23/2012    CBC    Component Value Date/Time   WBC 7.9 04/20/2014 0545   WBC 7.6 12/18/2010 1030   RBC 4.04 04/20/2014 0545   RBC 4.19 12/18/2010 1030   HGB 11.8* 04/20/2014 0545   HGB  11.8 12/18/2010 1030   HCT 36.2 04/20/2014 0545   HCT 35.4 12/18/2010 1030   PLT 212 04/20/2014 0545   PLT 223 12/18/2010 1030   MCV 89.6 04/20/2014 0545   MCV 84.5 12/18/2010 1030   LYMPHSABS 1.5 04/17/2014 1313   LYMPHSABS 1.7 12/18/2010 1030   MONOABS 0.3 04/17/2014 1313   MONOABS 0.4 12/18/2010 1030   EOSABS 0.0 04/17/2014 1313   EOSABS 0.1 12/18/2010 1030   BASOSABS 0.0 04/17/2014 1313   BASOSABS 0.0 12/18/2010 1030    CMP     Component Value Date/Time   NA 139 04/20/2014 0545   K 4.0 04/20/2014 0545   CL 103 04/20/2014 0545   CO2 23 04/20/2014 0545   GLUCOSE 131* 04/20/2014 0545   BUN 27* 04/20/2014 0545   CREATININE 1.15* 04/20/2014 0545   CALCIUM 8.8 04/20/2014 0545   PROT 6.6 09/04/2013 2358   ALBUMIN 3.4* 09/04/2013 2358   AST 12 09/04/2013 2358   ALT 5 09/04/2013 2358   ALKPHOS 63 09/04/2013 2358   BILITOT 0.5 09/04/2013 2358   GFRNONAA 42* 04/20/2014 0545   GFRAA 48* 04/20/2014 0545    Assessment and Plan  Basal ganglia infarction [name] stroke work-up was notable for acute infarcts in both basal ganglia with several previous small infarcts throughout the brain on MRI. She had a negative carotid doppler study and echocardiogram notable for EF 16-10%, grade 1 diastolic dysfunction, and mild aortic valve regurgitation. Paroxysmal atrial fibrillation was noted on telemetry and is likely responsible for infarctions. She received a ASA 324 mg on admission and then was started on daily 81 mg tablets of aspirin. [name] was on telemetry which noted paroxysmal atrial fibrillation. Cardiology was consulted and recommended anticoagulation with Xarelto renally dosed at 15 mg (her chadsvasc score is 6). ASA was Trevino/c when patient began xarelto. We also started atorvastatin 40 mg which can be considered by PCP. Both family and neurology agrees with this   Paroxysmal atrial fibrillation Cardiac monitoring revealed numerous episodes of atrial fibrillation noted on telemetry. She was  subsequently restarted on her home metoprolol and cardiology recommended beginning renal dosing of Xarelto (15 mg po daily) pending neurology's approval. [name] daughter in law and health care power of attorney, Vaughan Basta, was informed of all of the risks and benefits anticoagulation. Vaughan Basta agreed that [name] should be started on Xarelto which was begun prior to discharge. Cardiology increased coreg to TID which was continued on discharge.   Essential hypertension Ms. Perazzo's blood pressure medications were  initially held for permissive HTN in setting of possible stroke. Her HCPOW, Vaughan Basta, helped [place] reconcile her medications and said all she was taking at home was metoprolol and lisinopril. We restarted her metoprolol in setting of atrial fibrillation but we continued to hold lisinopril. Prior to discharge, cardiology increased her metoprolol tartrate to 25 mg TID. PCP should reassess BP and appropriate regimen   Vascular dementia During her hospital stay, Ms. Amundson's mental status was altered. It varied depending on the day whether or not see was oriented to person, place or time. Both her grandson and daughter in law, Vaughan Basta confirmed that this was her baseline. We determined that Ms. Sustaita's dementia is most likely secondary to chronic microvascular ischemic demyelination and chronic bilateral lacunar infarcts on head CT, likely secondary to a fib. Ms. Smyser was found to have two files in our system with different medications on them. After reconciling medications with daughter in law, it was confirmed that she is not currently on Donepezil and Zoloft and never received it during hospitalization. We also had a goals of care meeting with Vaughan Basta who felt that Ms Wilensky would not want an aggressive resuscitation and we changed the patient's code status to DO NOT RESUSCITATE. Documentation is in her paper chart   Hyperlipidemia : Lipid panel notable for cholesterol 227, LDL 167. Cardiology  recommends initiating statin. We began atorvastatin 40 mg po daily. Patient previously was on statin but Trevino/ced by PCP when trying to cut down on medications. Given new CVA, we started statin and PCP can consider whether to continue considering goals of care.     Hennie Duos, MD

## 2014-04-28 ENCOUNTER — Encounter: Payer: Self-pay | Admitting: Internal Medicine

## 2014-04-28 DIAGNOSIS — E785 Hyperlipidemia, unspecified: Secondary | ICD-10-CM | POA: Insufficient documentation

## 2014-04-28 NOTE — Assessment & Plan Note (Signed)
During her hospital stay, Cindy Trevino's mental status was altered. It varied depending on the day whether or not see was oriented to person, place or time. Both her grandson and daughter in law, Cindy Trevino confirmed that this was her baseline. We determined that Cindy Trevino's dementia is most likely secondary to chronic microvascular ischemic demyelination and chronic bilateral lacunar infarcts on head CT, likely secondary to a fib. Cindy Trevino was found to have two files in our system with different medications on them. After reconciling medications with daughter in law, it was confirmed that she is not currently on Donepezil and Zoloft and never received it during hospitalization. We also had a goals of care meeting with Cindy Trevino who felt that [name] would not want an aggressive resuscitation and we changed the patient's code status to DO NOT RESUSCITATE. Documentation is in her paper chart

## 2014-04-28 NOTE — Assessment & Plan Note (Signed)
Cardiac monitoring revealed numerous episodes of atrial fibrillation noted on telemetry. She was subsequently restarted on her home metoprolol and cardiology recommended beginning renal dosing of Xarelto (15 mg po daily) pending neurology's approval. Cindy Trevino daughter in law and health care power of attorney, Cindy Trevino, was informed of all of the risks and benefits anticoagulation. Cindy Trevino agreed that Cindy Trevino should be started on Xarelto which was begun prior to discharge. Cardiology increased coreg to TID which was continued on discharge.

## 2014-04-28 NOTE — Assessment & Plan Note (Signed)
Cindy Trevino stroke work-up was notable for acute infarcts in both basal ganglia with several previous small infarcts throughout the brain on MRI. She had a negative carotid doppler study and echocardiogram notable for EF 21-97%, grade 1 diastolic dysfunction, and mild aortic valve regurgitation. Paroxysmal atrial fibrillation was noted on telemetry and is likely responsible for infarctions. She received a ASA 324 mg on admission and then was started on daily 81 mg tablets of aspirin. Cindy Trevino was on telemetry which noted paroxysmal atrial fibrillation. Cardiology was consulted and recommended anticoagulation with Xarelto renally dosed at 15 mg (her chadsvasc score is 6). ASA was d/c when patient began xarelto. We also started atorvastatin 40 mg which can be considered by PCP. Both family and neurology agrees with this

## 2014-04-28 NOTE — Assessment & Plan Note (Signed)
Ms. Bergeman blood pressure medications were initially held for permissive HTN in setting of possible stroke. Her HCPOW, Vaughan Basta, helped [place] reconcile her medications and said all she was taking at home was metoprolol and lisinopril. We restarted her metoprolol in setting of atrial fibrillation but we continued to hold lisinopril. Prior to discharge, cardiology increased her metoprolol tartrate to 25 mg TID. PCP should reassess BP and appropriate regimen

## 2014-04-28 NOTE — Assessment & Plan Note (Signed)
:   Lipid panel notable for cholesterol 227, LDL 167. Cardiology recommends initiating statin. We began atorvastatin 40 mg po daily. Patient previously was on statin but d/ced by PCP when trying to cut down on medications. Given new CVA, we started statin and PCP can consider whether to continue considering goals of care.

## 2014-05-15 ENCOUNTER — Non-Acute Institutional Stay (SKILLED_NURSING_FACILITY): Payer: PRIVATE HEALTH INSURANCE | Admitting: Internal Medicine

## 2014-05-15 DIAGNOSIS — R0989 Other specified symptoms and signs involving the circulatory and respiratory systems: Secondary | ICD-10-CM

## 2014-05-15 DIAGNOSIS — L03115 Cellulitis of right lower limb: Secondary | ICD-10-CM

## 2014-05-15 NOTE — Progress Notes (Signed)
Patient ID: Cory Roughen, female   DOB: 02/20/1925, 80 y.o.   MRN: [id number]   This is an acute visit.  Level care skilled.  Facility [facility]  Code Status: DNR  Allergies: Review of patient's allergies indicates no known allergies.  No chief complaint on file.  [place]- complaint-acute visit secondary to erythematous right lower leg  HPI: Patient is 80 y.o. female who is admitted to SNF s/p a basal ganglia stroke--she has developed some erythema of her right lower leg and I am following up on this-apparently developed over the weekend she has been afebrile does not really complain of pain or discomfort in this area.  Past Medical History  Diagnosis Date  . COPD (chronic obstructive pulmonary disease)   . History of breast cancer     L breast  . Arthritis   . Dementia   . Dementia   . Hypertension   . Hyperlipidemia     Past Surgical History  Procedure Laterality Date  . Breast lumpectomy      left breast      Medication List       This list is accurate as of: 05/15/14 11:59 PM.  Always use your most recent med list.               amLODipine 5 MG tablet  Commonly known as:  NORVASC  Take 5 mg by mouth daily.     aspirin 81 MG tablet  Take 81 mg by mouth daily.     atorvastatin 40 MG tablet  Commonly known as:  LIPITOR  Take 1 tablet (40 mg total) by mouth daily at 6 PM.     BENICAR HCT 40-12.5 MG per tablet  Generic drug:  olmesartan-hydrochlorothiazide     CALCIUM CITRATE + PO  Take by mouth daily.     donepezil 10 MG tablet  Commonly known as:  ARICEPT     metoprolol tartrate 25 MG tablet  Commonly known as:  LOPRESSOR  Take 12.5 mg by mouth 2 (two) times daily.     metoprolol tartrate 25 MG tablet  Commonly known as:  LOPRESSOR  Take 1 tablet (25 mg total) by mouth 3 (three) times daily.     Rivaroxaban 15 MG Tabs tablet  Commonly known as:  XARELTO  Take 1 tablet (15 mg total) by mouth daily with supper.     sertraline 100 MG tablet    Commonly known as:  ZOLOFT  Take 100 mg by mouth daily.     simvastatin 40 MG tablet  Commonly known as:  ZOCOR  Take 40 mg by mouth every evening.     VITAMIN D-3 PO  Take 5,000 mg by mouth daily.        No orders of the defined types were placed in this encounter.     There is no immunization history on file for this patient.  History  Substance Use Topics  . Smoking status: Never Smoker   . Smokeless tobacco: Not on file  . Alcohol Use: No    Family history is noncontributory    Review of Systems  --limited secondary to dementia but per nursing staff she appears to be at her baseline and has not really complained of any shortness of breath chest pain or acute leg discomfort     There were no vitals filed for this visit.  Physical Exam  Temperature 97.1 pulse 66 respirations 20 blood pressure variable 130/70-163/51 in this range O2 saturation is in  the 90s on room air  GENERAL APPEARANCE: Alert, conversant,  No acute distress. --Sitting comfortably in her wheelchair-- SKIN: No diaphoresis rash--I do note on right lower leg of the anterior shin there is some erythema with warmth to touch this does not appear to be acutely tender this extends about halfway up the lower leg starting above the ankle HEAD: Normocephalic, atraumatic  EYES: Conjunctiva/lids clear. Pupils round, reactive. EOMs intact.  EARS: External exam WNL, canals clear. Hearing grossly normal.  NOSE: No deformity or discharge.  MOUTH/THROAT: Lips w/o lesions  RESPIRATORY: Breathing is even, unlabored. Some scattered coarse breath sounds small amount of wheezing CARDIOVASCULAR: Heart RRR no murmurs, rubs or gallops. mild peripheral edema.   GASTROINTESTINAL: Abdomen is soft, non-tender, not distended w/ normal bowel sounds. GENITOURINARY: Bladder non tender, not distended  MUSCULOSKELETAL: No abnormal joints or musculature NEUROLOGIC:  Cranial nerves 2-12 grossly intact. Moves all extremities   PSYCHIATRIC: dementia,pleasant, no behavioral issues  Patient Active Problem List   Diagnosis Date Noted  . Hyperlipidemia   . Essential hypertension 04/20/2014  . Vascular dementia 04/20/2014  . Paroxysmal atrial fibrillation 04/20/2014  . Basal ganglia infarction 04/17/2014  . History of breast cancer 08/23/2012    CBC    Component Value Date/Time   WBC 7.9 04/20/2014 0545   WBC 7.6 12/18/2010 1030   RBC 4.04 04/20/2014 0545   RBC 4.19 12/18/2010 1030   HGB 11.8* 04/20/2014 0545   HGB 11.8 12/18/2010 1030   HCT 36.2 04/20/2014 0545   HCT 35.4 12/18/2010 1030   PLT 212 04/20/2014 0545   PLT 223 12/18/2010 1030   MCV 89.6 04/20/2014 0545   MCV 84.5 12/18/2010 1030   LYMPHSABS 1.5 04/17/2014 1313   LYMPHSABS 1.7 12/18/2010 1030   MONOABS 0.3 04/17/2014 1313   MONOABS 0.4 12/18/2010 1030   EOSABS 0.0 04/17/2014 1313   EOSABS 0.1 12/18/2010 1030   BASOSABS 0.0 04/17/2014 1313   BASOSABS 0.0 12/18/2010 1030    CMP     Component Value Date/Time   NA 139 04/20/2014 0545   K 4.0 04/20/2014 0545   CL 103 04/20/2014 0545   CO2 23 04/20/2014 0545   GLUCOSE 131* 04/20/2014 0545   BUN 27* 04/20/2014 0545   CREATININE 1.15* 04/20/2014 0545   CALCIUM 8.8 04/20/2014 0545   PROT 6.6 09/04/2013 2358   ALBUMIN 3.4* 09/04/2013 2358   AST 12 09/04/2013 2358   ALT 5 09/04/2013 2358   ALKPHOS 63 09/04/2013 2358   BILITOT 0.5 09/04/2013 2358   GFRNONAA 42* 04/20/2014 0545   GFRAA 48* 04/20/2014 0545    Assessment and Plan   #1-right leg erythema-concern here for developing cellulitis Will start doxycycline 100 mg twice a day for 10 days and monitor for any changes-patient does not appear to be uncomfortable or any distress here.  #2-some history of chest congestion on exam again does not appear to be unstable here-O2 saturations continued be in the 90s on room air-will check a chest x-ray and monitor vital signs for 72 hours every shift with pulse ox-also will start duo nebs  every 6 hours when necessary for 5 days   Of note also will update laboratory work including a CBC with differential as well as a metabolic panel  No problem-specific assessment & plan notes found for this encounter.   Annaliza Zia C,

## 2014-05-21 ENCOUNTER — Encounter: Payer: Self-pay | Admitting: Internal Medicine

## 2014-06-28 ENCOUNTER — Non-Acute Institutional Stay (SKILLED_NURSING_FACILITY): Payer: PRIVATE HEALTH INSURANCE | Admitting: Internal Medicine

## 2014-06-28 ENCOUNTER — Encounter: Payer: Self-pay | Admitting: Internal Medicine

## 2014-06-28 DIAGNOSIS — I480 Paroxysmal atrial fibrillation: Secondary | ICD-10-CM

## 2014-06-28 DIAGNOSIS — I10 Essential (primary) hypertension: Secondary | ICD-10-CM

## 2014-06-28 DIAGNOSIS — I6381 Other cerebral infarction due to occlusion or stenosis of small artery: Secondary | ICD-10-CM

## 2014-06-28 DIAGNOSIS — I639 Cerebral infarction, unspecified: Secondary | ICD-10-CM

## 2014-06-28 DIAGNOSIS — E785 Hyperlipidemia, unspecified: Secondary | ICD-10-CM

## 2014-06-28 DIAGNOSIS — F0150 Vascular dementia without behavioral disturbance: Secondary | ICD-10-CM

## 2014-06-28 NOTE — Assessment & Plan Note (Signed)
Chronic and progressive but no large declines;pt was started on namenda 5 mg BID on 12/3; pt wears a wander guard

## 2014-06-28 NOTE — Progress Notes (Signed)
MRN: [id number] Name: Cindy Trevino  Sex: female Age: 80 y.o. DOB: 01-19-1925  [place] #: [name] Facility/Room: 217 Level Of Care: SNF Provider: [name] D Emergency Contacts: Extended Emergency Contact Information Primary Emergency Contact: Domingo Mend Address: [address], [place] Phone: [phone] Relation: Daughter  Code Status: DNR  Allergies: Review of patient's allergies indicates no known allergies.  Chief Complaint  Patient presents with  . Medical Management of Chronic Issues    HPI: Patient is 80 y.o. female who is being seen for routine issues.  Past Medical History  Diagnosis Date  . COPD (chronic obstructive pulmonary disease)   . History of breast cancer     L breast  . Arthritis   . Dementia   . Dementia   . Hypertension   . Hyperlipidemia     Past Surgical History  Procedure Laterality Date  . Breast lumpectomy      left breast      Medication List       This list is accurate as of: 06/28/14  7:02 PM.  Always use your most recent med list.               atorvastatin 40 MG tablet  Commonly known as:  LIPITOR  Take 1 tablet (40 mg total) by mouth daily at 6 PM.     CALCIUM CITRATE + PO  Take by mouth daily.     metoprolol tartrate 25 MG tablet  Commonly known as:  LOPRESSOR  Take 1 tablet (25 mg total) by mouth 3 (three) times daily.     Rivaroxaban 15 MG Tabs tablet  Commonly known as:  XARELTO  Take 1 tablet (15 mg total) by mouth daily with supper.     VITAMIN D-3 PO  Take 5,000 mg by mouth daily.        No orders of the defined types were placed in this encounter.     There is no immunization history on file for this patient.  History  Substance Use Topics  . Smoking status: Never Smoker   . Smokeless tobacco: Not on file  . Alcohol Use: No    Review of Systems   UTO 2/2 dementia;nurse voices no concerns    Filed Vitals:   06/28/14 1551  BP:  140/72  Pulse: 84  Temp: 97 F (36.1 C)  Resp: 20    Physical Exam  GENERAL APPEARANCE: Alert, conversant, No acute distress  SKIN: No diaphoresis rash HEENT: Unremarkable RESPIRATORY: Breathing is even, unlabored. Lung sounds are clear   CARDIOVASCULAR: Heart RRR no murmurs, rubs or gallops. No peripheral edema  GASTROINTESTINAL: Abdomen is soft, non-tender, not distended w/ normal bowel sounds.  GENITOURINARY: Bladder non tender, not distended  MUSCULOSKELETAL: No abnormal joints or musculature NEUROLOGIC: Cranial nerves 2-12 grossly intact. Moves all extremities PSYCHIATRIC: dementia,speaks but makes no sense but she's happy, no behavioral issues  Patient Active Problem List   Diagnosis Date Noted  . Hyperlipidemia   . Essential hypertension 04/20/2014  . Vascular dementia 04/20/2014  . Paroxysmal atrial fibrillation 04/20/2014  . Basal ganglia infarction 04/17/2014  . History of breast cancer 08/23/2012    CBC    Component Value Date/Time   WBC 7.9 04/20/2014 0545   WBC 7.6 12/18/2010 1030   RBC 4.04 04/20/2014 0545   RBC 4.19 12/18/2010 1030   HGB 11.8* 04/20/2014 0545   HGB 11.8 12/18/2010 1030  HCT 36.2 04/20/2014 0545   HCT 35.4 12/18/2010 1030   PLT 212 04/20/2014 0545   PLT 223 12/18/2010 1030   MCV 89.6 04/20/2014 0545   MCV 84.5 12/18/2010 1030   LYMPHSABS 1.5 04/17/2014 1313   LYMPHSABS 1.7 12/18/2010 1030   MONOABS 0.3 04/17/2014 1313   MONOABS 0.4 12/18/2010 1030   EOSABS 0.0 04/17/2014 1313   EOSABS 0.1 12/18/2010 1030   BASOSABS 0.0 04/17/2014 1313   BASOSABS 0.0 12/18/2010 1030    CMP     Component Value Date/Time   NA 139 04/20/2014 0545   K 4.0 04/20/2014 0545   CL 103 04/20/2014 0545   CO2 23 04/20/2014 0545   GLUCOSE 131* 04/20/2014 0545   BUN 27* 04/20/2014 0545   CREATININE 1.15* 04/20/2014 0545   CALCIUM 8.8 04/20/2014 0545   PROT 6.6 09/04/2013 2358   ALBUMIN 3.4* 09/04/2013 2358   AST 12 09/04/2013 2358   ALT 5  09/04/2013 2358   ALKPHOS 63 09/04/2013 2358   BILITOT 0.5 09/04/2013 2358   GFRNONAA 42* 04/20/2014 0545   GFRAA 48* 04/20/2014 0545    Assessment and Plan  Vascular dementia Chronic and progressive but no large declines;pt was started on namenda 5 mg BID on 12/3; pt wears a wander guard  Paroxysmal atrial fibrillation On metoprolol for rate and xarelto as prophylaxis  Essential hypertension Pt's BP right at goal;under heading of less is better will leave her on her just one medication  Basal ganglia infarction Felt 2/2 to PAF;pt on xarelto and statin which was started end of 03/2014-will check fasting lipid end of jan 2016.  Hyperlipidemia Started on statin end of October;check FLP end of jan 2016    Hennie Duos, MD

## 2014-06-28 NOTE — Assessment & Plan Note (Signed)
On metoprolol for rate and xarelto as prophylaxis

## 2014-06-28 NOTE — Assessment & Plan Note (Signed)
Felt 2/2 to PAF;pt on xarelto and statin which was started end of 03/2014-will check fasting lipid end of jan 2016.

## 2014-06-28 NOTE — Assessment & Plan Note (Signed)
Started on statin end of October;check FLP end of jan 2016

## 2014-06-28 NOTE — Assessment & Plan Note (Signed)
Pt's BP right at goal;under heading of less is better will leave her on her just one medication

## 2014-08-04 ENCOUNTER — Encounter: Payer: Self-pay | Admitting: Internal Medicine

## 2014-08-04 ENCOUNTER — Non-Acute Institutional Stay (SKILLED_NURSING_FACILITY): Payer: Medicare Other | Admitting: Internal Medicine

## 2014-08-04 DIAGNOSIS — N183 Chronic kidney disease, stage 3 unspecified: Secondary | ICD-10-CM

## 2014-08-04 DIAGNOSIS — E785 Hyperlipidemia, unspecified: Secondary | ICD-10-CM

## 2014-08-04 DIAGNOSIS — F0150 Vascular dementia without behavioral disturbance: Secondary | ICD-10-CM

## 2014-08-04 DIAGNOSIS — D638 Anemia in other chronic diseases classified elsewhere: Secondary | ICD-10-CM

## 2014-08-04 DIAGNOSIS — I480 Paroxysmal atrial fibrillation: Secondary | ICD-10-CM

## 2014-08-04 DIAGNOSIS — I6381 Other cerebral infarction due to occlusion or stenosis of small artery: Secondary | ICD-10-CM

## 2014-08-04 DIAGNOSIS — I639 Cerebral infarction, unspecified: Secondary | ICD-10-CM

## 2014-08-04 DIAGNOSIS — I10 Essential (primary) hypertension: Secondary | ICD-10-CM

## 2014-08-04 HISTORY — DX: Anemia in other chronic diseases classified elsewhere: D63.8

## 2014-08-04 HISTORY — DX: Chronic kidney disease, stage 3 unspecified: N18.30

## 2014-08-04 NOTE — Assessment & Plan Note (Signed)
Chronic and stable with no RVR;Plan-continue xarelto daily, pt's ASA was d/c ad metoprolol for rate

## 2014-08-04 NOTE — Assessment & Plan Note (Signed)
Pleasantly demented with no large declines. Plan - continue namenda 5 mg BID

## 2014-08-04 NOTE — Assessment & Plan Note (Signed)
Pt started on lipitor 40 mg after her stroke; FLP and CMP ordered for next lab draw

## 2014-08-04 NOTE — Assessment & Plan Note (Signed)
GFR 48 in 08/2013 and Cr 1.24.;CMP ordered for next draw

## 2014-08-04 NOTE — Progress Notes (Signed)
MRN: [id number] Name: Cindy Trevino  Sex: female Age: 81 y.o. DOB: Jan 26, 1925  [place] #: [name] Facility/Room: 217D Level Of Care: SNF Provider: [name] D Emergency Contacts: Extended Emergency Contact Information Primary Emergency Contact: Domingo Mend Address: [address], [place] Phone: [phone] Relation: Daughter  Code Status: DNR  Allergies: Review of patient's allergies indicates no known allergies.  Chief Complaint  Patient presents with  . Medical Management of Chronic Issues    HPI: Patient is 81 y.o. female who is being seen for routine issues.   Past Medical History  Diagnosis Date  . COPD (chronic obstructive pulmonary disease)   . History of breast cancer     L breast  . Arthritis   . Dementia   . Dementia   . Hypertension   . Hyperlipidemia     Past Surgical History  Procedure Laterality Date  . Breast lumpectomy      left breast      Medication List       This list is accurate as of: 08/04/14  2:14 PM.  Always use your most recent med list.               atorvastatin 40 MG tablet  Commonly known as:  LIPITOR  Take 1 tablet (40 mg total) by mouth daily at 6 PM.     memantine 5 MG tablet  Commonly known as:  NAMENDA  Take 5 mg by mouth 2 (two) times daily.     metoprolol tartrate 25 MG tablet  Commonly known as:  LOPRESSOR  Take 1 tablet (25 mg total) by mouth 3 (three) times daily.     Rivaroxaban 15 MG Tabs tablet  Commonly known as:  XARELTO  Take 1 tablet (15 mg total) by mouth daily with supper.        Meds ordered this encounter  Medications  . memantine (NAMENDA) 5 MG tablet    Sig: Take 5 mg by mouth 2 (two) times daily.     There is no immunization history on file for this patient.  History  Substance Use Topics  . Smoking status: Never Smoker   . Smokeless tobacco: Not on file  . Alcohol Use: No    Review of Systems  Dementia prohibits  reliable;nurse without concerns    Filed Vitals:   08/04/14 1355  BP: 140/72  Pulse: 84  Temp: 97 F (36.1 C)  Resp: 20    Physical Exam  GENERAL APPEARANCE: Alert, conversant, No acute distress  SKIN: No diaphoresis rash, or wounds HEENT: Unremarkable RESPIRATORY: Breathing is even, unlabored. Lung sounds are clear   CARDIOVASCULAR: Heart RRR no murmurs, rubs or gallops. No peripheral edema  GASTROINTESTINAL: Abdomen is soft, non-tender, not distended w/ normal bowel sounds.  GENITOURINARY: Bladder non tender, not distended  MUSCULOSKELETAL: No abnormal joints or musculature NEUROLOGIC: Cranial nerves 2-12 grossly intact PSYCHIATRIC:pleasantly demented, no behavioral issues  Patient Active Problem List   Diagnosis Date Noted  . CKD (chronic kidney disease) stage 3, GFR 30-59 ml/min 08/04/2014  . Anemia of chronic disease 08/04/2014  . Hyperlipidemia   . Essential hypertension 04/20/2014  . Vascular dementia 04/20/2014  . Paroxysmal atrial fibrillation 04/20/2014  . Basal ganglia infarction 04/17/2014  . History of breast cancer 08/23/2012    CBC    Component Value Date/Time   WBC 7.9 04/20/2014 0545   WBC 7.6 12/18/2010 1030   RBC  4.04 04/20/2014 0545   RBC 4.19 12/18/2010 1030   HGB 11.8* 04/20/2014 0545   HGB 11.8 12/18/2010 1030   HCT 36.2 04/20/2014 0545   HCT 35.4 12/18/2010 1030   PLT 212 04/20/2014 0545   PLT 223 12/18/2010 1030   MCV 89.6 04/20/2014 0545   MCV 84.5 12/18/2010 1030   LYMPHSABS 1.5 04/17/2014 1313   LYMPHSABS 1.7 12/18/2010 1030   MONOABS 0.3 04/17/2014 1313   MONOABS 0.4 12/18/2010 1030   EOSABS 0.0 04/17/2014 1313   EOSABS 0.1 12/18/2010 1030   BASOSABS 0.0 04/17/2014 1313   BASOSABS 0.0 12/18/2010 1030    CMP     Component Value Date/Time   NA 139 04/20/2014 0545   K 4.0 04/20/2014 0545   CL 103 04/20/2014 0545   CO2 23 04/20/2014 0545   GLUCOSE 131* 04/20/2014 0545   BUN 27* 04/20/2014 0545   CREATININE 1.15*  04/20/2014 0545   CALCIUM 8.8 04/20/2014 0545   PROT 6.6 09/04/2013 2358   ALBUMIN 3.4* 09/04/2013 2358   AST 12 09/04/2013 2358   ALT 5 09/04/2013 2358   ALKPHOS 63 09/04/2013 2358   BILITOT 0.5 09/04/2013 2358   GFRNONAA 42* 04/20/2014 0545   GFRAA 48* 04/20/2014 0545    Assessment and Plan  Paroxysmal atrial fibrillation Chronic and stable with no RVR;Plan-continue xarelto daily, pt's ASA was d/c ad metoprolol for rate   Essential hypertension Stable on metoprolol 25 mg TID;Plan -continue current med at same dose   Basal ganglia infarction Pt continues on xarelto started in hospital for embolic stroke and statin.FLP ordered and LFT's   Vascular dementia Pleasantly demented with no large declines. Plan - continue namenda 5 mg BID   Hyperlipidemia Pt started on lipitor 40 mg after her stroke; FLP and CMP ordered for next lab draw   CKD (chronic kidney disease) stage 3, GFR 30-59 ml/min GFR 48 in 08/2013 and Cr 1.24.;CMP ordered for next draw   Anemia of chronic disease Hb 11.8 03/2014; from records pt has had low iron amd b12 prior;will order these with rest of lab work     Hennie Duos, MD

## 2014-08-04 NOTE — Assessment & Plan Note (Signed)
Hb 11.8 03/2014; from records pt has had low iron amd b12 prior;will order these with rest of lab work

## 2014-08-04 NOTE — Assessment & Plan Note (Signed)
Pt continues on xarelto started in hospital for embolic stroke and statin.FLP ordered and LFT's

## 2014-08-04 NOTE — Assessment & Plan Note (Signed)
Stable on metoprolol 25 mg TID;Plan -continue current med at same dose

## 2014-08-07 LAB — HEPATIC FUNCTION PANEL
ALT: 9 U/L (ref 7–35)
AST: 13 U/L (ref 13–35)
Alkaline Phosphatase: 49 U/L (ref 25–125)
Bilirubin, Total: 0.4 mg/dL

## 2014-08-07 LAB — LIPID PANEL
CHOLESTEROL: 105 mg/dL (ref 0–200)
HDL: 34 mg/dL — AB (ref 35–70)
LDL CALC: 61 mg/dL
TRIGLYCERIDES: 49 mg/dL (ref 40–160)

## 2014-09-05 DIAGNOSIS — Z961 Presence of intraocular lens: Secondary | ICD-10-CM | POA: Diagnosis not present

## 2014-09-14 ENCOUNTER — Non-Acute Institutional Stay (SKILLED_NURSING_FACILITY): Payer: Medicare Other | Admitting: Internal Medicine

## 2014-09-14 DIAGNOSIS — I10 Essential (primary) hypertension: Secondary | ICD-10-CM | POA: Diagnosis not present

## 2014-09-14 DIAGNOSIS — I480 Paroxysmal atrial fibrillation: Secondary | ICD-10-CM | POA: Diagnosis not present

## 2014-09-14 DIAGNOSIS — M25552 Pain in left hip: Secondary | ICD-10-CM | POA: Diagnosis not present

## 2014-09-14 DIAGNOSIS — F0390 Unspecified dementia without behavioral disturbance: Secondary | ICD-10-CM | POA: Diagnosis not present

## 2014-09-14 NOTE — Progress Notes (Signed)
Patient ID: Cindy Trevino, female   DOB: 30-Sep-1924, 81 y.o.   MRN: [id number]   this is a routine visit-acute visit.  Level care skilled.  [facility].   Chief Complaint  Patient presents with  . Medical Management of Chronic Issues--acute visit secondary to left knee hip discomfort     HPI: Patient is 81 y.o. female who is being seen for routine issues--as well as recent history of what appear to be left knee discomfort the past couple days there is been no history of trauma per nursing-.  Patient has been stable per nursing but has complained apparently some increase left leg what appears to be knee pain according to nursing.   Past Medical History  Diagnosis Date  . COPD (chronic obstructive pulmonary disease)   . History of breast cancer     L breast  . Arthritis   . Dementia   . Dementia   . Hypertension   . Hyperlipidemia     Past Surgical History  Procedure Laterality Date  . Breast lumpectomy      left breast      Medication List       t.               atorvastatin 40 MG tablet  Commonly known as:  LIPITOR  Take 1 tablet (40 mg total) by mouth daily at 6 PM.     memantine 5 MG tablet  Commonly known as:  NAMENDA  Take 5 mg by mouth 2 (two) times daily.     metoprolol tartrate 25 MG tablet  Commonly known as:  LOPRESSOR  Take 1 tablet (25 mg total) by mouth 3 (three) times daily.     Rivaroxaban 15 MG Tabs tablet  Commonly known as:  XARELTO  Take 1 tablet (15 mg total) by mouth daily with supper.        Meds ordered this encounter  Medications  . memantine (NAMENDA) 5 MG tablet    Sig: Take 5 mg by mouth 2 (two) times daily.     There is no immunization history on file for this patient.  History  Substance Use Topics  . Smoking status: Never Smoker   . Smokeless tobacco: Not on file  . Alcohol Use: No    Review of Systems  Dementia prohibits reliable review of systems patient.  However she is fairly specific one I  did examine her bed she did complain of pain but this actually was more in the left hip rather than the knee area-.  Nursing staff does not report any other concerns no complaints of recent shortness of breath or chest pain apparently she eats quite well                        Physical Exam She is afebrile pulses 74 respirations 18 blood pressure is pending GENERAL APPEARANCE: Alert, conversant, No acute distress--pleasantly confused  SKIN: No diaphoresis rash, or wounds HEENT: Oropharynx clear mucous membranes moist RESPIRATORY: Breathing is even, unlabored. Lung sounds are clear   CARDIOVASCULAR: Heart RRR no murmurs, rubs or gallops. No peripheral edema pedal pulses intact GASTROINTESTINAL: Abdomen is soft, non-tender, not distended w/ normal bowel sounds.--Somewhat obese  GENITOURINARY: Bladder non tender, not distended  MUSCULOSKELETAL: No abnormal joints or musculature--she does have some pain with flexion and extension at the hip on the left I do not note any deformities-I could not really appreciate pain with flexion and extension at the  knee the area is cool to touch-the size of left knee does not appear to be greater or more erythematous than the corresponding right knee  Neurologic appears grossly intact touch sensation intact cranial nerves intact her speech is clear she does have some history of mild left-sided weakness t PSYCHIATRIC:pleasantly demented, no behavioral issues  Patient Active Problem List   Diagnosis Date Noted  . CKD (chronic kidney disease) stage 3, GFR 30-59 ml/min 08/04/2014  . Anemia of chronic disease 08/04/2014  . Hyperlipidemia   . Essential hypertension 04/20/2014  . Vascular dementia 04/20/2014  . Paroxysmal atrial fibrillation 04/20/2014  . Basal ganglia infarction 04/17/2014  . History of breast cancer 08/23/2012    CBC    Component Value Date/Time   WBC 7.9 04/20/2014 0545   WBC 7.6 12/18/2010 1030   RBC 4.04 04/20/2014 0545    RBC 4.19 12/18/2010 1030   HGB 11.8* 04/20/2014 0545   HGB 11.8 12/18/2010 1030   HCT 36.2 04/20/2014 0545   HCT 35.4 12/18/2010 1030   PLT 212 04/20/2014 0545   PLT 223 12/18/2010 1030   MCV 89.6 04/20/2014 0545   MCV 84.5 12/18/2010 1030   LYMPHSABS 1.5 04/17/2014 1313   LYMPHSABS 1.7 12/18/2010 1030   MONOABS 0.3 04/17/2014 1313   MONOABS 0.4 12/18/2010 1030   EOSABS 0.0 04/17/2014 1313   EOSABS 0.1 12/18/2010 1030   BASOSABS 0.0 04/17/2014 1313   BASOSABS 0.0 12/18/2010 1030    CMP     Component Value Date/Time   NA 139 04/20/2014 0545   K 4.0 04/20/2014 0545   CL 103 04/20/2014 0545   CO2 23 04/20/2014 0545   GLUCOSE 131* 04/20/2014 0545   BUN 27* 04/20/2014 0545   CREATININE 1.15* 04/20/2014 0545   CALCIUM 8.8 04/20/2014 0545   PROT 6.6 09/04/2013 2358   ALBUMIN 3.4* 09/04/2013 2358   AST 12 09/04/2013 2358   ALT 5 09/04/2013 2358   ALKPHOS 63 09/04/2013 2358   BILITOT 0.5 09/04/2013 2358   GFRNONAA 42* 04/20/2014 0545   GFRAA 48* 04/20/2014 0545    Assessment and Plan  Left hip and knee discomfort-this actually appears more localized in the left hip area-will order an x-ray of the hip and knee for follow-up also nonweightbearing until we get those results-in regards to pain control she does receive Tylenol apparently she does not do well with stronger pain medicine this will have to be monitored as well   Paroxysmal atrial fibrillation Chronic and stable with no RVR;Plan-continue xarelto daily, pt's ASA was d/c ad metoprolol for rate--at this point appear stable   Essential hypertension Stable on metoprolol 25 mg TID;Plan -   Basal ganglia infarction Pt continues on xarelto started in hospital for embolic stroke and statin   Vascular dementia Pleasantly demented with no large declines. Plan - continue namenda 5 mg BID   Hyperlipidemia Pt started on lipitor 40 mg after her stroke--lipid panel February 2016 appear to be fairly unremarkable   CKD  (chronic kidney disease) stage 3, GFR 30-59 ml/min GFR 48 in 08/2013 and Cr 1.24.; This appears to be stable per lab done in early February will recheck this   Anemia of chronic disease Hb 11.8 03/2014; from records pt has had low iron amd b12 prior--B12 level was now within normal range at 293-hemoglobin 10.3 in early February this appears to be improvement from 9.2 back in November--will recheck this  [phone]

## 2014-09-15 LAB — BASIC METABOLIC PANEL
BUN: 23 mg/dL — AB (ref 4–21)
Glucose: 97 mg/dL
POTASSIUM: 4 mmol/L (ref 3.4–5.3)
SODIUM: 140 mmol/L (ref 137–147)

## 2014-09-15 LAB — CBC AND DIFFERENTIAL
HEMATOCRIT: 36 % (ref 36–46)
Hemoglobin: 11.2 g/dL — AB (ref 12.0–16.0)
PLATELETS: 192 10*3/uL (ref 150–399)
WBC: 8.5 10*3/mL

## 2014-09-19 ENCOUNTER — Non-Acute Institutional Stay (SKILLED_NURSING_FACILITY): Payer: Medicare Other | Admitting: Internal Medicine

## 2014-09-19 DIAGNOSIS — M1712 Unilateral primary osteoarthritis, left knee: Secondary | ICD-10-CM | POA: Diagnosis not present

## 2014-09-19 NOTE — Progress Notes (Addendum)
MRN: [id number] Name: Cindy Trevino  Sex: female Age: 81 y.o. DOB: 05-Nov-1924  [place] #: [name] Facility/Room:217D Level Of Care: SNF Provider: [name] D Emergency Contacts: Extended Emergency Contact Information Primary Emergency Contact: Domingo Mend Address: [address], [place] Phone: [phone] Relation: Daughter  Code Status: DNR  Allergies: Review of patient's allergies indicates no known allergies.  Chief Complaint  Patient presents with  . Acute Visit    HPI: Patient is 81 y.o. female who is being seen for report of L knee pain.  Past Medical History  Diagnosis Date  . COPD (chronic obstructive pulmonary disease)   . History of breast cancer     L breast  . Arthritis   . Dementia   . Dementia   . Hypertension   . Hyperlipidemia     Past Surgical History  Procedure Laterality Date  . Breast lumpectomy      left breast      Medication List       This list is accurate as of: 09/19/14 11:59 PM.  Always use your most recent med list.               atorvastatin 40 MG tablet  Commonly known as:  LIPITOR  Take 1 tablet (40 mg total) by mouth daily at 6 PM.     memantine 5 MG tablet  Commonly known as:  NAMENDA  Take 5 mg by mouth 2 (two) times daily.     metoprolol tartrate 25 MG tablet  Commonly known as:  LOPRESSOR  Take 1 tablet (25 mg total) by mouth 3 (three) times daily.     Rivaroxaban 15 MG Tabs tablet  Commonly known as:  XARELTO  Take 1 tablet (15 mg total) by mouth daily with supper.        No orders of the defined types were placed in this encounter.     There is no immunization history on file for this patient.  History  Substance Use Topics  . Smoking status: Never Smoker   . Smokeless tobacco: Not on file  . Alcohol Use: No    Review of Systems  DATA OBTAINED: from patient, nurse, GENERAL:  no fevers, fatigue, appetite changes SKIN: No itching,  rash HEENT: No complaint RESPIRATORY: No cough, wheezing, SOB CARDIAC: No chest pain, palpitations, lower extremity edema  GI: No abdominal pain, No N/V/D or constipation, No heartburn or reflux  GU: No dysuria, frequency or urgency, or incontinence  MUSCULOSKELETAL: L knee pain;pt has not had a fall or trauma NEUROLOGIC: No headache, dizziness  PSYCHIATRIC: No overt anxiety or sadness  Filed Vitals:   09/19/14 1359  BP: 140/72  Pulse: 84  Temp: 97.3 F (36.3 C)  Resp: 18    Physical Exam  GENERAL APPEARANCE: Alert, conversant, No acute distress  SKIN: No diaphoresis rash, or wounds HEENT: Unremarkable RESPIRATORY: Breathing is even, unlabored. Lung sounds are clear   CARDIOVASCULAR: Heart RRR no murmurs, rubs or gallops. No peripheral edema  GASTROINTESTINAL: Abdomen is soft, non-tender, not distended w/ normal bowel sounds.  GENITOURINARY: Bladder non tender, not distended  MUSCULOSKELETAL: L knee with mild diffuse swelling, no redness , no heat, no point tender,pt expresses discomfort with passive bending across knee NEUROLOGIC: Cranial nerves 2-12 grossly intact. Moves all extremities PSYCHIATRIC: dementia,pleasant, no behavioral issues  Patient Active Problem List   Diagnosis Date Noted  . OA (osteoarthritis) of knee  09/20/2014  . CKD (chronic kidney disease) stage 3, GFR 30-59 ml/min 08/04/2014  . Anemia of chronic disease 08/04/2014  . Hyperlipidemia   . Essential hypertension 04/20/2014  . Vascular dementia 04/20/2014  . Paroxysmal atrial fibrillation 04/20/2014  . Basal ganglia infarction 04/17/2014  . History of breast cancer 08/23/2012    CBC    Component Value Date/Time   WBC 7.9 04/20/2014 0545   WBC 7.6 12/18/2010 1030   RBC 4.04 04/20/2014 0545   RBC 4.19 12/18/2010 1030   HGB 11.8* 04/20/2014 0545   HGB 11.8 12/18/2010 1030   HCT 36.2 04/20/2014 0545   HCT 35.4 12/18/2010 1030   PLT 212 04/20/2014 0545   PLT 223 12/18/2010 1030   MCV 89.6  04/20/2014 0545   MCV 84.5 12/18/2010 1030   LYMPHSABS 1.5 04/17/2014 1313   LYMPHSABS 1.7 12/18/2010 1030   MONOABS 0.3 04/17/2014 1313   MONOABS 0.4 12/18/2010 1030   EOSABS 0.0 04/17/2014 1313   EOSABS 0.1 12/18/2010 1030   BASOSABS 0.0 04/17/2014 1313   BASOSABS 0.0 12/18/2010 1030    CMP     Component Value Date/Time   NA 139 04/20/2014 0545   K 4.0 04/20/2014 0545   CL 103 04/20/2014 0545   CO2 23 04/20/2014 0545   GLUCOSE 131* 04/20/2014 0545   BUN 27* 04/20/2014 0545   CREATININE 1.15* 04/20/2014 0545   CALCIUM 8.8 04/20/2014 0545   PROT 6.6 09/04/2013 2358   ALBUMIN 3.4* 09/04/2013 2358   AST 12 09/04/2013 2358   ALT 5 09/04/2013 2358   ALKPHOS 63 09/04/2013 2358   BILITOT 0.5 09/04/2013 2358   GFRNONAA 42* 04/20/2014 0545   GFRAA 48* 04/20/2014 0545    Assessment and Plan  OA (osteoarthritis) of knee Pt had films done of L hip and tib/fib which shows arthritis and no fracture.Pt has only tylenol.Have written for Ultram 50 mg q 6 prn and biofreeze BID prn.     Hennie Duos, MD

## 2014-09-20 ENCOUNTER — Encounter: Payer: Self-pay | Admitting: Internal Medicine

## 2014-09-20 DIAGNOSIS — M1710 Unilateral primary osteoarthritis, unspecified knee: Secondary | ICD-10-CM | POA: Insufficient documentation

## 2014-09-20 DIAGNOSIS — M179 Osteoarthritis of knee, unspecified: Secondary | ICD-10-CM | POA: Insufficient documentation

## 2014-09-20 HISTORY — DX: Unilateral primary osteoarthritis, unspecified knee: M17.10

## 2014-09-20 HISTORY — DX: Osteoarthritis of knee, unspecified: M17.9

## 2014-09-20 NOTE — Assessment & Plan Note (Signed)
Pt had films done of L hip and tib/fib which shows arthritis and no fracture.Pt has only tylenol.Have written for Ultram 50 mg q 6 prn and biofreeze BID prn.

## 2014-09-20 NOTE — Addendum Note (Signed)
Addended by: [name] D on: 09/20/2014 08:21 AM   Modules accepted: Orders

## 2014-11-06 ENCOUNTER — Encounter: Payer: Self-pay | Admitting: *Deleted

## 2014-11-24 ENCOUNTER — Non-Acute Institutional Stay (SKILLED_NURSING_FACILITY): Payer: Medicare Other | Admitting: Internal Medicine

## 2014-11-24 ENCOUNTER — Encounter: Payer: Self-pay | Admitting: Internal Medicine

## 2014-11-24 DIAGNOSIS — E785 Hyperlipidemia, unspecified: Secondary | ICD-10-CM

## 2014-11-24 DIAGNOSIS — F0151 Vascular dementia with behavioral disturbance: Secondary | ICD-10-CM | POA: Diagnosis not present

## 2014-11-24 DIAGNOSIS — N183 Chronic kidney disease, stage 3 unspecified: Secondary | ICD-10-CM

## 2014-11-24 DIAGNOSIS — D638 Anemia in other chronic diseases classified elsewhere: Secondary | ICD-10-CM

## 2014-11-24 DIAGNOSIS — F01518 Vascular dementia, unspecified severity, with other behavioral disturbance: Secondary | ICD-10-CM

## 2014-11-24 NOTE — Assessment & Plan Note (Signed)
From time to time pt exhibits verbal agitation but can be redirected; Psych has seen her for these and no new mes;Plan - conti nue namenda which is now 10 mg BID

## 2014-11-24 NOTE — Progress Notes (Signed)
MRN: [id number] Name: Cindy Trevino  Sex: female Age: 81 y.o. DOB: [birth year]  [place] #: [name] Facility/Room:217D Level Of Care: SNF Provider: [name] D Emergency Contacts: Extended Emergency Contact Information Primary Emergency Contact: Domingo Mend Address: [address], [place] Phone: [phone] Relation: Daughter  Code Status: DNR  Allergies: Review of patient's allergies indicates no known allergies.  Chief Complaint  Patient presents with  . Medical Management of Chronic Issues    HPI: Patient is 81 y.o. female who is being seen for routine isssues.  Past Medical History  Diagnosis Date  . COPD (chronic obstructive pulmonary disease)   . History of breast cancer     L breast  . Arthritis   . Dementia   . Dementia   . Hypertension   . Hyperlipidemia     Past Surgical History  Procedure Laterality Date  . Breast lumpectomy      left breast      Medication List       This list is accurate as of: 11/24/14  2:58 PM.  Always use your most recent med list.               acetaminophen 325 MG tablet  Commonly known as:  TYLENOL  Take 650 mg by mouth every 6 (six) hours as needed.     atorvastatin 40 MG tablet  Commonly known as:  LIPITOR  Take 1 tablet (40 mg total) by mouth daily at 6 PM.     memantine 10 MG tablet  Commonly known as:  NAMENDA  Take 10 mg by mouth 2 (two) times daily.     Menthol (Topical Analgesic) 4 % Gel  Apply topically. Apply to lt knee every 6hours prn for pain.     metoprolol tartrate 25 MG tablet  Commonly known as:  LOPRESSOR  Take 1 tablet (25 mg total) by mouth 3 (three) times daily.     Rivaroxaban 15 MG Tabs tablet  Commonly known as:  XARELTO  Take 1 tablet (15 mg total) by mouth daily with supper.     traMADol 50 MG tablet  Commonly known as:  ULTRAM  Take 50 mg by mouth every 6 (six) hours as needed for moderate pain or severe pain.         No orders of the defined types were placed in this encounter.    Immunization History  Administered Date(s) Administered  . PPD Test 05/08/2014    History  Substance Use Topics  . Smoking status: Never Smoker   . Smokeless tobacco: Not on file  . Alcohol Use: No    Review of Systems  DATA OBTAINED: from patient, nurse; per nursing occ behavoirs GENERAL:  no fevers, fatigue, appetite changes SKIN: No itching, rash HEENT: No complaint RESPIRATORY: No cough, wheezing, SOB CARDIAC: No chest pain, palpitations, lower extremity edema  GI: No abdominal pain, No N/V/D or constipation, No heartburn or reflux  GU: No dysuria, frequency or urgency, or incontinence  MUSCULOSKELETAL: No unrelieved bone/joint pain NEUROLOGIC: No headache, dizziness  PSYCHIATRIC: No overt anxiety or sadness  Filed Vitals:   11/24/14 1444  BP: 139/75  Pulse: 69  Temp: 97.4 F (36.3 C)  Resp: 19    Physical Exam  GENERAL APPEARANCE: Alert, conversant, No acute distress  SKIN: No diaphoresis rash HEENT: Unremarkable RESPIRATORY: Breathing is even, unlabored. Lung sounds are clear   CARDIOVASCULAR: Heart RRR no  murmurs, rubs or gallops. No peripheral edema  GASTROINTESTINAL: Abdomen is soft, non-tender, not distended w/ normal bowel sounds.  GENITOURINARY: Bladder non tender, not distended  MUSCULOSKELETAL: No abnormal joints or musculature NEUROLOGIC: Cranial nerves 2-12 grossly intact PSYCHIATRIC: Mood and affect appropriate to situation with dementia, no behavioral issues  Patient Active Problem List   Diagnosis Date Noted  . OA (osteoarthritis) of knee 09/20/2014  . CKD (chronic kidney disease) stage 3, GFR 30-59 ml/min 08/04/2014  . Anemia of chronic disease 08/04/2014  . Hyperlipidemia   . Essential hypertension 04/20/2014  . Vascular dementia with behavior disturbance 04/20/2014  . Paroxysmal atrial fibrillation 04/20/2014  . Basal ganglia infarction 04/17/2014  . History of  breast cancer 08/23/2012    CBC    Component Value Date/Time   WBC 8.5 09/15/2014   WBC 7.9 04/20/2014 0545   WBC 7.6 12/18/2010 1030   RBC 4.04 04/20/2014 0545   RBC 4.19 12/18/2010 1030   HGB 11.2* 09/15/2014   HGB 11.8 12/18/2010 1030   HCT 36 09/15/2014   HCT 35.4 12/18/2010 1030   PLT 192 09/15/2014   PLT 223 12/18/2010 1030   MCV 89.6 04/20/2014 0545   MCV 84.5 12/18/2010 1030   LYMPHSABS 1.5 04/17/2014 1313   LYMPHSABS 1.7 12/18/2010 1030   MONOABS 0.3 04/17/2014 1313   MONOABS 0.4 12/18/2010 1030   EOSABS 0.0 04/17/2014 1313   EOSABS 0.1 12/18/2010 1030   BASOSABS 0.0 04/17/2014 1313   BASOSABS 0.0 12/18/2010 1030    CMP     Component Value Date/Time   NA 140 09/15/2014   NA 139 04/20/2014 0545   K 4.0 09/15/2014   CL 103 04/20/2014 0545   CO2 23 04/20/2014 0545   GLUCOSE 131* 04/20/2014 0545   BUN 23* 09/15/2014   BUN 27* 04/20/2014 0545   CREATININE 1.15* 04/20/2014 0545   CALCIUM 8.8 04/20/2014 0545   PROT 6.6 09/04/2013 2358   ALBUMIN 3.4* 09/04/2013 2358   AST 13 08/07/2014   ALT 9 08/07/2014   ALKPHOS 49 08/07/2014   BILITOT 0.5 09/04/2013 2358   GFRNONAA 42* 04/20/2014 0545   GFRAA 48* 04/20/2014 0545    Assessment and Plan  Vascular dementia with behavior disturbance From time to time pt exhibits verbal agitation but can be redirected; Psych has seen her for these and no new mes;Plan - conti nue namenda which is now 10 mg BID   Hyperlipidemia Last LDL - 61 and HDL 34 on lipitor 40 mg; Plan - cont current lipitor   Anemia of chronic disease Most recent Hb 08/2014 was 11.2 in 08/2014 stable form prior; iron low normal and normal B12,folate and ferritin; will monitor   CKD (chronic kidney disease) stage 3, GFR 30-59 ml/min GFR > 60 in 08/2014 BUN 23/Cr 0.89 which is improved from prior     Hennie Duos, MD

## 2014-11-24 NOTE — Assessment & Plan Note (Signed)
GFR > 60 in 08/2014 BUN 23/Cr 0.89 which is improved from prior

## 2014-11-24 NOTE — Assessment & Plan Note (Signed)
Most recent Hb 08/2014 was 11.2 in 08/2014 stable form prior; iron low normal and normal B12,folate and ferritin; will monitor

## 2014-11-24 NOTE — Assessment & Plan Note (Signed)
Last LDL - 61 and HDL 34 on lipitor 40 mg; Plan - cont current lipitor

## 2014-12-22 ENCOUNTER — Encounter: Payer: Self-pay | Admitting: Internal Medicine

## 2014-12-22 ENCOUNTER — Non-Acute Institutional Stay (SKILLED_NURSING_FACILITY): Payer: Medicare Other | Admitting: Internal Medicine

## 2014-12-22 DIAGNOSIS — I480 Paroxysmal atrial fibrillation: Secondary | ICD-10-CM | POA: Diagnosis not present

## 2014-12-22 DIAGNOSIS — I639 Cerebral infarction, unspecified: Secondary | ICD-10-CM

## 2014-12-22 DIAGNOSIS — I10 Essential (primary) hypertension: Secondary | ICD-10-CM

## 2014-12-22 DIAGNOSIS — I6381 Other cerebral infarction due to occlusion or stenosis of small artery: Secondary | ICD-10-CM

## 2014-12-22 NOTE — Progress Notes (Signed)
MRN: [id number] Name: Cindy Trevino  Sex: female Age: 81 y.o. DOB: [birth year]  [place] #: [name] Facility/Room:217D Level Of Care: SNF Provider: [name] D Emergency Contacts: Extended Emergency Contact Information Primary Emergency Contact: Domingo Mend Address: [address], [place] Phone: [phone] Relation: Daughter  Code Status: DNR  Allergies: Review of patient's allergies indicates no known allergies.  Chief Complaint  Patient presents with  . Medical Management of Chronic Issues    HPI: Patient is 81 y.o. female who is being seen for routine issues.  Past Medical History  Diagnosis Date  . COPD (chronic obstructive pulmonary disease)   . History of breast cancer     L breast  . Arthritis   . Dementia   . Dementia   . Hypertension   . Hyperlipidemia     Past Surgical History  Procedure Laterality Date  . Breast lumpectomy      left breast      Medication List       This list is accurate as of: 12/22/14 11:53 AM.  Always use your most recent med list.               acetaminophen 325 MG tablet  Commonly known as:  TYLENOL  Take 650 mg by mouth every 6 (six) hours as needed.     atorvastatin 40 MG tablet  Commonly known as:  LIPITOR  Take 1 tablet (40 mg total) by mouth daily at 6 PM.     memantine 10 MG tablet  Commonly known as:  NAMENDA  Take 10 mg by mouth 2 (two) times daily.     Menthol (Topical Analgesic) 4 % Gel  Apply topically. Apply to lt knee every 6hours prn for pain.     metoprolol tartrate 25 MG tablet  Commonly known as:  LOPRESSOR  Take 1 tablet (25 mg total) by mouth 3 (three) times daily.     Rivaroxaban 15 MG Tabs tablet  Commonly known as:  XARELTO  Take 1 tablet (15 mg total) by mouth daily with supper.     traMADol 50 MG tablet  Commonly known as:  ULTRAM  Take 50 mg by mouth every 6 (six) hours as needed for moderate pain or severe pain.        No  orders of the defined types were placed in this encounter.    Immunization History  Administered Date(s) Administered  . PPD Test 05/08/2014    History  Substance Use Topics  . Smoking status: Never Smoker   . Smokeless tobacco: Not on file  . Alcohol Use: No    Review of Systems  DATA OBTAINED: from nurse GENERAL:  no fevers, fatigue, appetite changes SKIN: No itching, rash HEENT: No complaint RESPIRATORY: No cough, wheezing, SOB CARDIAC: No chest pain, palpitations, lower extremity edema  GI: No abdominal pain, No N/V/D or constipation, No heartburn or reflux  GU: No dysuria, frequency or urgency, or incontinence  MUSCULOSKELETAL: No unrelieved bone/joint pain NEUROLOGIC: No headache, dizziness  PSYCHIATRIC: No overt anxiety or sadness  Filed Vitals:   12/22/14 1137  BP: 129/74  Pulse: 73  Temp: 98.3 F (36.8 C)  Resp: 19    Physical Exam  GENERAL APPEARANCE: Alert,  No acute distress  SKIN: No diaphoresis rash HEENT: Unremarkable RESPIRATORY: Breathing is even, unlabored. Lung sounds are clear   CARDIOVASCULAR: Heart RRR no murmurs, rubs or gallops. No peripheral  edema  GASTROINTESTINAL: Abdomen is soft, non-tender, not distended w/ normal bowel sounds.  GENITOURINARY: Bladder non tender, not distended  MUSCULOSKELETAL: No abnormal joints or musculature NEUROLOGIC: Cranial nerves 2-12 grossly intact. PSYCHIATRIC: Mood and affect appropriate to situation with dementia, no behavioral issues  Patient Active Problem List   Diagnosis Date Noted  . OA (osteoarthritis) of knee 09/20/2014  . CKD (chronic kidney disease) stage 3, GFR 30-59 ml/min 08/04/2014  . Anemia of chronic disease 08/04/2014  . Hyperlipidemia   . Essential hypertension 04/20/2014  . Vascular dementia with behavior disturbance 04/20/2014  . Paroxysmal atrial fibrillation 04/20/2014  . Basal ganglia infarction 04/17/2014  . History of breast cancer 08/23/2012    CBC    Component Value  Date/Time   WBC 8.5 09/15/2014   WBC 7.9 04/20/2014 0545   WBC 7.6 12/18/2010 1030   RBC 4.04 04/20/2014 0545   RBC 4.19 12/18/2010 1030   HGB 11.2* 09/15/2014   HGB 11.8 12/18/2010 1030   HCT 36 09/15/2014   HCT 35.4 12/18/2010 1030   PLT 192 09/15/2014   PLT 223 12/18/2010 1030   MCV 89.6 04/20/2014 0545   MCV 84.5 12/18/2010 1030   LYMPHSABS 1.5 04/17/2014 1313   LYMPHSABS 1.7 12/18/2010 1030   MONOABS 0.3 04/17/2014 1313   MONOABS 0.4 12/18/2010 1030   EOSABS 0.0 04/17/2014 1313   EOSABS 0.1 12/18/2010 1030   BASOSABS 0.0 04/17/2014 1313   BASOSABS 0.0 12/18/2010 1030    CMP     Component Value Date/Time   NA 140 09/15/2014   NA 139 04/20/2014 0545   K 4.0 09/15/2014   CL 103 04/20/2014 0545   CO2 23 04/20/2014 0545   GLUCOSE 131* 04/20/2014 0545   BUN 23* 09/15/2014   BUN 27* 04/20/2014 0545   CREATININE 1.15* 04/20/2014 0545   CALCIUM 8.8 04/20/2014 0545   PROT 6.6 09/04/2013 2358   ALBUMIN 3.4* 09/04/2013 2358   AST 13 08/07/2014   ALT 9 08/07/2014   ALKPHOS 49 08/07/2014   BILITOT 0.5 09/04/2013 2358   GFRNONAA 42* 04/20/2014 0545   GFRAA 48* 04/20/2014 0545    Assessment and Plan  Paroxysmal atrial fibrillation Continues stable;plan -continue metoprolol 25 mg TID with xarelto as prophylaxis  Essential hypertension Bp has been stable on metoprolol 25 mg TID  Basal ganglia infarction Pt had embolic stroke and continues on xarelto for prophylaxis; no new strokes or deficits    Hennie Duos, MD

## 2014-12-22 NOTE — Assessment & Plan Note (Signed)
Bp has been stable on metoprolol 25 mg TID

## 2014-12-22 NOTE — Assessment & Plan Note (Signed)
Continues stable;plan -continue metoprolol 25 mg TID with xarelto as prophylaxis

## 2014-12-22 NOTE — Assessment & Plan Note (Signed)
Pt had embolic stroke and continues on xarelto for prophylaxis; no new strokes or deficits

## 2015-01-22 ENCOUNTER — Encounter: Payer: Self-pay | Admitting: Internal Medicine

## 2015-01-22 ENCOUNTER — Non-Acute Institutional Stay (SKILLED_NURSING_FACILITY): Payer: Medicare Other | Admitting: Internal Medicine

## 2015-01-22 DIAGNOSIS — I480 Paroxysmal atrial fibrillation: Secondary | ICD-10-CM

## 2015-01-22 DIAGNOSIS — N183 Chronic kidney disease, stage 3 unspecified: Secondary | ICD-10-CM

## 2015-01-22 DIAGNOSIS — F0151 Vascular dementia with behavioral disturbance: Secondary | ICD-10-CM

## 2015-01-22 DIAGNOSIS — D638 Anemia in other chronic diseases classified elsewhere: Secondary | ICD-10-CM

## 2015-01-22 DIAGNOSIS — I82402 Acute embolism and thrombosis of unspecified deep veins of left lower extremity: Secondary | ICD-10-CM

## 2015-01-22 DIAGNOSIS — I10 Essential (primary) hypertension: Secondary | ICD-10-CM | POA: Diagnosis not present

## 2015-01-22 DIAGNOSIS — F01518 Vascular dementia, unspecified severity, with other behavioral disturbance: Secondary | ICD-10-CM

## 2015-01-22 NOTE — Progress Notes (Signed)
Patient ID: Cindy Trevino, female   DOB: Sep 01, 1924, 81 y.o.   MRN: [id number] MRN: [id number] Name: Cindy Trevino  Sex: female Age: 81 y.o. DOB: 27-May-1925  [place] #: [name] Facility/Room:217D Level Of Care: SNF Provider: Wille Celeste Emergency Contacts: Extended Emergency Contact Information Primary Emergency Contact: Domingo Mend Address: [address], [place] Phone: [phone] Relation: Daughter  Code Status: DNR  Allergies: Review of patient's allergies indicates no known allergies.  Chief Complaint  Patient presents with  . Medical Management of Chronic Issues   acute visit secondary to increased left lower extremity edema  HPI: Patient is 81 y.o. female who is being seen for routine issues. Including dementia chronic kidney disease atrial fibrillation-also seen for increased left lower extremity edema.  Patient is a poor historian secondary to dementia but she is not complaining of any pain or shortness of breath-apparently the increased edema on her left leg was noted over the weekend.  I do not previously she's had x-rays of her left tibia and fibula and hip that did not show any acute process.  Her vital signs continued to be stable.  In regards to her other issues these appear to be stable apparently she does have occasional agitation with her dementia but this is not consistent she is pleasant and cooperative today.  Heart rate appears to be controlled with a history of atrial fibrillation she is on Lopressor is on Xarelto for anticoagulation  Past Medical History  Diagnosis Date  . COPD (chronic obstructive pulmonary disease)   . History of breast cancer     L breast  . Arthritis   . Dementia   . Dementia   . Hypertension   . Hyperlipidemia     Past Surgical History  Procedure Laterality Date  . Breast lumpectomy      left breast      Medication List       This list is accurate as of:  01/22/15  4:38 PM.  Always use your most recent med list.               acetaminophen 325 MG tablet  Commonly known as:  TYLENOL  Take 650 mg by mouth every 6 (six) hours as needed.     atorvastatin 40 MG tablet  Commonly known as:  LIPITOR  Take 1 tablet (40 mg total) by mouth daily at 6 PM.     memantine 10 MG tablet  Commonly known as:  NAMENDA  Take 10 mg by mouth 2 (two) times daily.     Menthol (Topical Analgesic) 4 % Gel  Apply topically. Apply to lt knee every 6hours prn for pain.     metoprolol tartrate 25 MG tablet  Commonly known as:  LOPRESSOR  Take 1 tablet (25 mg total) by mouth 3 (three) times daily.     Rivaroxaban 15 MG Tabs tablet  Commonly known as:  XARELTO  Take 1 tablet (15 mg total) by mouth daily with supper.     traMADol 50 MG tablet  Commonly known as:  ULTRAM  Take 50 mg by mouth every 6 (six) hours as needed for moderate pain or severe pain.        No orders of the defined types were placed in this encounter.    Immunization History  Administered Date(s) Administered  . PPD Test 05/08/2014    History  Substance Use Topics  .  Smoking status: Never Smoker   . Smokeless tobacco: Not on file  . Alcohol Use: No    Review of Systems  DATA OBTAINED: from nurse GENERAL:  no fevers, fatigue, appetite changes SKIN: No itching, rash HEENT: No complaint RESPIRATORY: No cough, wheezing, SOB CARDIAC: No chest pain, palpitations, has increased left lower extremity edema from baseline GI: No abdominal pain, No N/V/D or constipation, No heartburn or reflux  GU: No dysuria, frequency or urgency, or incontinence  MUSCULOSKELETAL: No unrelieved bone/joint pain NEUROLOGIC: No headache, dizziness  PSYCHIATRIC: No overt anxiety or sadness. Dementia with apparently occasional agitation per nursing  Filed Vitals:   01/22/15 1636  BP: 130/68  Pulse: 80  Temp: 97.6 F (36.4 C)  Resp: 20    Physical Exam  GENERAL APPEARANCE: Alert,  No acute  distress sitting comfortably in her wheelchair SKIN: No diaphoresis rash HEENT: Unremarkable pharynx clear mucous membranes moist she has numerous extractions RESPIRATORY: Breathing is even, unlabored. Lung sounds are clear   CARDIOVASCULAR: Heart irregular irregular no murmurs, rubs or gallops. Appears to have venous stasis changes edema bilaterally however during the nursery this is significantly increased on the left I would say 2+ edema there is some cool erythematous changes to her shin area this is nontender not warm  GASTROINTESTINAL: Abdomen is soft, non-tender, not distended w/ normal bowel sounds.  GENITOURINARY: Bladder non tender, not distended  MUSCULOSKELETAL: No abnormal joints or musculature--is able to move all her extremities 4 ambulates in a wheelchair NEUROLOGIC: Cranial nerves 2-12 grossly intact.-Speech is clear no lateralizing findings PSYCHIATRIC: Mood and affect appropriate to situation with dementia, no behavioral issues she is pleasant smiling -- does follow simple verbal commands  Patient Active Problem List   Diagnosis Date Noted  . OA (osteoarthritis) of knee 09/20/2014  . CKD (chronic kidney disease) stage 3, GFR 30-59 ml/min 08/04/2014  . Anemia of chronic disease 08/04/2014  . Hyperlipidemia   . Essential hypertension 04/20/2014  . Vascular dementia with behavior disturbance 04/20/2014  . Paroxysmal atrial fibrillation 04/20/2014  . Basal ganglia infarction 04/17/2014  . History of breast cancer 08/23/2012    CBC    Component Value Date/Time   WBC 8.5 09/15/2014   WBC 7.9 04/20/2014 0545   WBC 7.6 12/18/2010 1030   RBC 4.04 04/20/2014 0545   RBC 4.19 12/18/2010 1030   HGB 11.2* 09/15/2014   HGB 11.8 12/18/2010 1030   HCT 36 09/15/2014   HCT 35.4 12/18/2010 1030   PLT 192 09/15/2014   PLT 223 12/18/2010 1030   MCV 89.6 04/20/2014 0545   MCV 84.5 12/18/2010 1030   LYMPHSABS 1.5 04/17/2014 1313   LYMPHSABS 1.7 12/18/2010 1030   MONOABS 0.3  04/17/2014 1313   MONOABS 0.4 12/18/2010 1030   EOSABS 0.0 04/17/2014 1313   EOSABS 0.1 12/18/2010 1030   BASOSABS 0.0 04/17/2014 1313   BASOSABS 0.0 12/18/2010 1030    CMP     Component Value Date/Time   NA 140 09/15/2014   NA 139 04/20/2014 0545   K 4.0 09/15/2014   CL 103 04/20/2014 0545   CO2 23 04/20/2014 0545   GLUCOSE 131* 04/20/2014 0545   BUN 23* 09/15/2014   BUN 27* 04/20/2014 0545   CREATININE 1.15* 04/20/2014 0545   CALCIUM 8.8 04/20/2014 0545   PROT 6.6 09/04/2013 2358   ALBUMIN 3.4* 09/04/2013 2358   AST 13 08/07/2014   ALT 9 08/07/2014   ALKPHOS 49 08/07/2014   BILITOT 0.5 09/04/2013 2358   GFRNONAA 42*  04/20/2014 0545   GFRAA 48* 04/20/2014 0545    Assessment and Plan  1-history of increased left lower extremity edema-this is unilateral one would be concerned about ruling out a DVT will order a venous Doppler clinically she does not appear to be having any pain here.  There is some mild cool edema at this point would suspect this is more venous stasis changes but this will have to be monitored as well for any increased erythema.  Also monitor vital signs pulse ox every shift.  Also will update a CBC with differential  2 history of atrial fibrillation-this appears rate controlled on Lopressor 25 mg 3 times a day on Xarelto as prophylaxis which one would suspect we'll put her at a fairly low risk for DVT however again will order the venous Doppler.  #3 hypertension this appears stable on metoprolol.  #4-history of basal ganglia infarct-she had an embolic stroke in continues on Xarelto for prophylaxis--she also is on a statin --update lipid panel.  #5-history of anemia thought to be chronic last hemoglobin 11.2 on 09/15/2014-will update this as well.  #6 history of chronic kidney disease last creatinine 0.89 BUN 23 back in March will update this as well this appears to be relatively stable.  #7 history of osteoarthritis pain appears to be controlled  currently with tramadol every 6 hours when necessary she is also receiving bio freeze.  .  #5 history of dementia --she appears to be doing well with supportive care she is on Namenda-  Addendum we have received reports of the ultrasound it is positive for a DVT left lower extremity-involving the popliteal femoral veins-will start patient on Lovenox-she appears to have failed Xarelto therapy--this also was discussed with nursing extensively as well as with Dr. Sheppard Coil  via phone who will follow-up as needed  We'll add diagnosis of DVT-which is a new diagnoses  [id number]-of note greater than 35 minutes spent assessing patient-reviewing chart-discussing her status with nursing staff as well as with Dr. Sheppard Coil via phone-and coordinating plan of care.  Of note greater than 50% of time spent coordinating plan of care again with input from nursing as well as Dr. Solmon Ice, Anthoney Harada,

## 2015-01-23 ENCOUNTER — Non-Acute Institutional Stay (SKILLED_NURSING_FACILITY): Payer: Medicare Other | Admitting: Internal Medicine

## 2015-01-23 ENCOUNTER — Encounter: Payer: Self-pay | Admitting: Internal Medicine

## 2015-01-23 DIAGNOSIS — I82409 Acute embolism and thrombosis of unspecified deep veins of unspecified lower extremity: Secondary | ICD-10-CM | POA: Insufficient documentation

## 2015-01-23 DIAGNOSIS — E785 Hyperlipidemia, unspecified: Secondary | ICD-10-CM

## 2015-01-23 DIAGNOSIS — F01518 Vascular dementia, unspecified severity, with other behavioral disturbance: Secondary | ICD-10-CM

## 2015-01-23 DIAGNOSIS — F0151 Vascular dementia with behavioral disturbance: Secondary | ICD-10-CM | POA: Diagnosis not present

## 2015-01-23 DIAGNOSIS — I82402 Acute embolism and thrombosis of unspecified deep veins of left lower extremity: Secondary | ICD-10-CM

## 2015-01-23 DIAGNOSIS — M1712 Unilateral primary osteoarthritis, left knee: Secondary | ICD-10-CM

## 2015-01-23 HISTORY — DX: Acute embolism and thrombosis of unspecified deep veins of unspecified lower extremity: I82.409

## 2015-01-23 NOTE — Assessment & Plan Note (Signed)
No major declines or problem with pain reported; Plan - cont tylenol and ultram for severe pain.

## 2015-01-23 NOTE — Assessment & Plan Note (Deleted)
Pt has had no major declines or outbursts; Plan - continue namenda.

## 2015-01-23 NOTE — Assessment & Plan Note (Signed)
Reordering FLP and LFT with blood draw for labs to investigate DVT;Plan - vcontinue lipitor 40 mg daily for now

## 2015-01-23 NOTE — Assessment & Plan Note (Addendum)
Dx last night by U/S. Pt is on xarelto for a fib, not a coumadin candidate 2/2 age and falls. However now pt has develped a clot while on xarelto. Pt has been started on lovenox for the time being. Have written for coag studies - anti-thrombin panel,protein C and S activity, factor V Leiden and homocysteine level. Also CBC. If any of these are pos then coumadin might make more sense or even an IVC filter. Will await results.

## 2015-01-23 NOTE — Progress Notes (Signed)
MRN: [id number] Name: Cindy Trevino  Sex: female Age: 81 y.o. DOB: 1925/03/28  [place] #: [name] Facility/Room:217D Level Of Care: SNF Provider: [name] D Emergency Contacts: Extended Emergency Contact Information Primary Emergency Contact: Domingo Mend Address: [address], [place] Phone: [phone] Relation: Daughter  Code Status: DNR  Allergies: Review of patient's allergies indicates no known allergies.  Chief Complaint  Patient presents with  . Acute Visit    HPI: Patient is 81 y.o. female who was seen yesterday by Mr Oscar La for LLE swelling. U/S ordered showed a DVT and pt was started on Lovenox as she has acquired her DVT while on xarelto for a fib. Pt is being seen by me today to begin a work-up. Pt is also being seen for OA and hyperlipidemia.  Past Medical History  Diagnosis Date  . COPD (chronic obstructive pulmonary disease)   . History of breast cancer     L breast  . Arthritis   . Dementia   . Dementia   . Hypertension   . Hyperlipidemia     Past Surgical History  Procedure Laterality Date  . Breast lumpectomy      left breast      Medication List       This list is accurate as of: 01/23/15  9:43 PM.  Always use your most recent med list.               acetaminophen 325 MG tablet  Commonly known as:  TYLENOL  Take 650 mg by mouth every 6 (six) hours as needed.     atorvastatin 40 MG tablet  Commonly known as:  LIPITOR  Take 1 tablet (40 mg total) by mouth daily at 6 PM.     memantine 10 MG tablet  Commonly known as:  NAMENDA  Take 10 mg by mouth 2 (two) times daily.     Menthol (Topical Analgesic) 4 % Gel  Apply topically. Apply to lt knee every 6hours prn for pain.     metoprolol tartrate 25 MG tablet  Commonly known as:  LOPRESSOR  Take 1 tablet (25 mg total) by mouth 3 (three) times daily.     Rivaroxaban 15 MG Tabs tablet  Commonly known as:  XARELTO  Take 1 tablet  (15 mg total) by mouth daily with supper.     traMADol 50 MG tablet  Commonly known as:  ULTRAM  Take 50 mg by mouth every 6 (six) hours as needed for moderate pain or severe pain.        No orders of the defined types were placed in this encounter.    Immunization History  Administered Date(s) Administered  . PPD Test 05/08/2014    History  Substance Use Topics  . Smoking status: Never Smoker   . Smokeless tobacco: Not on file  . Alcohol Use: No    Review of Systems  DATA OBTAINED: from nurse; pt is confused GENERAL:  no fevers, fatigue, appetite changes SKIN: No itching, rash HEENT: No complaint RESPIRATORY: No cough, wheezing, SOB CARDIAC: No chest pain, palpitations, + lower extremity edema  GI: No abdominal pain, No N/V/D or constipation, No heartburn or reflux  GU: No dysuria, frequency or urgency, or incontinence  MUSCULOSKELETAL: No unrelieved bone/joint pain NEUROLOGIC: No headache, dizziness  PSYCHIATRIC: No overt anxiety or sadness  Filed Vitals:   01/23/15 2126  BP: 134/75  Pulse: 79  Temp:  62 F (36.7 C)  Resp: 18    Physical Exam  GENERAL APPEARANCE: Alert, minconversant, WF, No acute distress  SKIN: No diaphoresis rash, or wounds HEENT: Unremarkable RESPIRATORY: Breathing is even, unlabored. Lung sounds are clear   CARDIOVASCULAR: Heart RRR no murmurs, rubs or gallops. LLE  edema very obvious with some mild heat GASTROINTESTINAL: Abdomen is soft, non-tender, not distended w/ normal bowel sounds.  GENITOURINARY: Bladder non tender, not distended  MUSCULOSKELETAL: No abnormal joints or musculature NEUROLOGIC: Cranial nerves 2-12 grossly intact PSYCHIATRIC: confused, no behavioral issues  Patient Active Problem List   Diagnosis Date Noted  . DVT of lower extremity (deep venous thrombosis) 01/23/2015  . OA (osteoarthritis) of knee 09/20/2014  . CKD (chronic kidney disease) stage 3, GFR 30-59 ml/min 08/04/2014  . Anemia of chronic disease  08/04/2014  . Hyperlipidemia   . Essential hypertension 04/20/2014  . Vascular dementia with behavior disturbance 04/20/2014  . Paroxysmal atrial fibrillation 04/20/2014  . Basal ganglia infarction 04/17/2014  . History of breast cancer 08/23/2012    CBC    Component Value Date/Time   WBC 8.5 09/15/2014   WBC 7.9 04/20/2014 0545   WBC 7.6 12/18/2010 1030   RBC 4.04 04/20/2014 0545   RBC 4.19 12/18/2010 1030   HGB 11.2* 09/15/2014   HGB 11.8 12/18/2010 1030   HCT 36 09/15/2014   HCT 35.4 12/18/2010 1030   PLT 192 09/15/2014   PLT 223 12/18/2010 1030   MCV 89.6 04/20/2014 0545   MCV 84.5 12/18/2010 1030   LYMPHSABS 1.5 04/17/2014 1313   LYMPHSABS 1.7 12/18/2010 1030   MONOABS 0.3 04/17/2014 1313   MONOABS 0.4 12/18/2010 1030   EOSABS 0.0 04/17/2014 1313   EOSABS 0.1 12/18/2010 1030   BASOSABS 0.0 04/17/2014 1313   BASOSABS 0.0 12/18/2010 1030    CMP     Component Value Date/Time   NA 140 09/15/2014   NA 139 04/20/2014 0545   K 4.0 09/15/2014   CL 103 04/20/2014 0545   CO2 23 04/20/2014 0545   GLUCOSE 131* 04/20/2014 0545   BUN 23* 09/15/2014   BUN 27* 04/20/2014 0545   CREATININE 1.15* 04/20/2014 0545   CALCIUM 8.8 04/20/2014 0545   PROT 6.6 09/04/2013 2358   ALBUMIN 3.4* 09/04/2013 2358   AST 13 08/07/2014   ALT 9 08/07/2014   ALKPHOS 49 08/07/2014   BILITOT 0.5 09/04/2013 2358   GFRNONAA 42* 04/20/2014 0545   GFRAA 48* 04/20/2014 0545    Assessment and Plan  DVT of lower extremity (deep venous thrombosis) Dx last night by U/S. Pt is on xarelto for a fib, not a coumadin candidate 2/2 age and falls. However now pt has develped a clot while on xarelto. Pt has been started on lovenox for the time being. Have written for coag studies - anti-thrombin panel,protein C and S activity, factor V Leiden and homocysteine level. If any of these are pos then coumadin might make more sense or even an IVC filter. Will await results.  OA (osteoarthritis) of knee No major  declines or problem with pain reported; Plan - cont tylenol and ultram for severe pain.  Hyperlipidemia Reordering FLP and LFT with blood draw for labs to investigate DVT;Plan - vcontinue lipitor 40 mg daily for now    Hennie Duos, MD

## 2015-02-21 ENCOUNTER — Encounter: Payer: Self-pay | Admitting: Internal Medicine

## 2015-02-21 ENCOUNTER — Non-Acute Institutional Stay (SKILLED_NURSING_FACILITY): Payer: Medicare Other | Admitting: Internal Medicine

## 2015-02-21 DIAGNOSIS — I480 Paroxysmal atrial fibrillation: Secondary | ICD-10-CM

## 2015-02-21 DIAGNOSIS — I82402 Acute embolism and thrombosis of unspecified deep veins of left lower extremity: Secondary | ICD-10-CM

## 2015-02-21 DIAGNOSIS — D638 Anemia in other chronic diseases classified elsewhere: Secondary | ICD-10-CM | POA: Diagnosis not present

## 2015-02-21 DIAGNOSIS — I10 Essential (primary) hypertension: Secondary | ICD-10-CM | POA: Diagnosis not present

## 2015-02-21 NOTE — Assessment & Plan Note (Signed)
Plan - still waiting on last of clotting studies results;pt continues on on lovenox

## 2015-02-21 NOTE — Assessment & Plan Note (Signed)
Stable on current regimen;Plan - cont metoprolol 25 mg TID

## 2015-02-21 NOTE — Progress Notes (Addendum)
MRN: [id number] Name: Cindy Trevino  Sex: female Age: 81 y.o. DOB: 1924-12-28  [facility] Facility/Room:217 Level Of Care: SNF Provider: [name] D Emergency Contacts: Extended Emergency Contact Information Primary Emergency Contact: Domingo Mend Address: [place], [place] Phone: [phone] Relation: Daughter  Code Status: DNR  Allergies: Review of patient's allergies indicates no known allergies.  Chief Complaint  Patient presents with  . Medical Management of Chronic Issues    HPI: Patient is 81 y.o. female with dementia, AF, HTN, HLD being seen today for PAF, HTN, anemia and DVT.  Past Medical History  Diagnosis Date  . COPD (chronic obstructive pulmonary disease)   . History of breast cancer     L breast  . Arthritis   . Dementia   . Dementia   . Hypertension   . Hyperlipidemia     Past Surgical History  Procedure Laterality Date  . Breast lumpectomy      left breast      Medication List       This list is accurate as of: 02/21/15  1:07 PM.  Always use your most recent med list.               acetaminophen 325 MG tablet  Commonly known as:  TYLENOL  Take 650 mg by mouth every 6 (six) hours as needed.     atorvastatin 40 MG tablet  Commonly known as:  LIPITOR  Take 1 tablet (40 mg total) by mouth daily at 6 PM.     memantine 10 MG tablet  Commonly known as:  NAMENDA  Take 10 mg by mouth 2 (two) times daily.     Menthol (Topical Analgesic) 4 % Gel  Apply topically. Apply to lt knee every 6hours prn for pain.     metoprolol tartrate 25 MG tablet  Commonly known as:  LOPRESSOR  Take 1 tablet (25 mg total) by mouth 3 (three) times daily.     Rivaroxaban 15 MG Tabs tablet  Commonly known as:  XARELTO  Take 1 tablet (15 mg total) by mouth daily with supper.     traMADol 50 MG tablet  Commonly known as:  ULTRAM  Take 50 mg by mouth every 6 (six) hours as needed for moderate  pain or severe pain.        No orders of the defined types were placed in this encounter.    Immunization History  Administered Date(s) Administered  . PPD Test 05/08/2014    Social History  Substance Use Topics  . Smoking status: Never Smoker   . Smokeless tobacco: Not on file  . Alcohol Use: No    Review of Systems  DATA OBTAINED: from patient, nurse GENERAL:  no fevers, fatigue, appetite changes SKIN: No itching, rash HEENT: No complaint RESPIRATORY: No cough, wheezing, SOB CARDIAC: No chest pain, palpitations, lower extremity edema  GI: No abdominal pain, No N/V/D or constipation, No heartburn or reflux  GU: No dysuria, frequency or urgency, or incontinence  MUSCULOSKELETAL: No unrelieved bone/joint pain NEUROLOGIC: No headache, dizziness  PSYCHIATRIC: No overt anxiety or sadness  Filed Vitals:   02/21/15 1255  BP: 134/75  Pulse: 81  Temp: 98 F (36.7 C)  Resp: 19    Physical Exam  GENERAL APPEARANCE: Alert  WF No acute distress  SKIN: No diaphoresis rash HEENT: Unremarkable RESPIRATORY: Breathing is even, unlabored. Lung sounds are clear  CARDIOVASCULAR: Heart RRR no murmurs, rubs or gallops. Trace LLE peripheral edema  GASTROINTESTINAL: Abdomen is soft, non-tender, not distended w/ normal bowel sounds.  GENITOURINARY: Bladder non tender, not distended  MUSCULOSKELETAL: No abnormal joints or musculature NEUROLOGIC: Cranial nerves 2-12 grossly intact. Moves all extremities PSYCHIATRIC: dementia, no behavioral issues  Patient Active Problem List   Diagnosis Date Noted  . DVT of lower extremity (deep venous thrombosis) 01/23/2015  . OA (osteoarthritis) of knee 09/20/2014  . CKD (chronic kidney disease) stage 3, GFR 30-59 ml/min 08/04/2014  . Anemia of chronic disease 08/04/2014  . Hyperlipidemia   . Essential hypertension 04/20/2014  . Vascular dementia with behavior disturbance 04/20/2014  . Paroxysmal atrial fibrillation 04/20/2014  . Basal  ganglia infarction 04/17/2014  . History of breast cancer 08/23/2012    CBC    Component Value Date/Time   WBC 8.5 09/15/2014   WBC 7.9 04/20/2014 0545   WBC 7.6 12/18/2010 1030   RBC 4.04 04/20/2014 0545   RBC 4.19 12/18/2010 1030   HGB 11.2* 09/15/2014   HGB 11.8 12/18/2010 1030   HCT 36 09/15/2014   HCT 35.4 12/18/2010 1030   PLT 192 09/15/2014   PLT 223 12/18/2010 1030   MCV 89.6 04/20/2014 0545   MCV 84.5 12/18/2010 1030   LYMPHSABS 1.5 04/17/2014 1313   LYMPHSABS 1.7 12/18/2010 1030   MONOABS 0.3 04/17/2014 1313   MONOABS 0.4 12/18/2010 1030   EOSABS 0.0 04/17/2014 1313   EOSABS 0.1 12/18/2010 1030   BASOSABS 0.0 04/17/2014 1313   BASOSABS 0.0 12/18/2010 1030    CMP     Component Value Date/Time   NA 140 09/15/2014   NA 139 04/20/2014 0545   K 4.0 09/15/2014   CL 103 04/20/2014 0545   CO2 23 04/20/2014 0545   GLUCOSE 131* 04/20/2014 0545   BUN 23* 09/15/2014   BUN 27* 04/20/2014 0545   CREATININE 1.15* 04/20/2014 0545   CALCIUM 8.8 04/20/2014 0545   PROT 6.6 09/04/2013 2358   ALBUMIN 3.4* 09/04/2013 2358   AST 13 08/07/2014   ALT 9 08/07/2014   ALKPHOS 49 08/07/2014   BILITOT 0.5 09/04/2013 2358   GFRNONAA 42* 04/20/2014 0545   GFRAA 48* 04/20/2014 0545    Assessment and Plan  Anemia of chronic disease 12/2014 HB was 10.6, slt decline ; Plan - low nl iron last check, will start FeSO4 325 mg daily and recheck in a month  Paroxysmal atrial fibrillation Stable, no problems Plan - cont metoprolol 25 mg TID and pt is now on lovenox for DVT so prophylaxed at this time with lovenox.  Essential hypertension Stable on current regimen;Plan - cont metoprolol 25 mg TID  DVT of lower extremity (deep venous thrombosis) Plan - still waiting on last of clotting studies results;pt continues on on lovenox    Hennie Duos, MD

## 2015-02-21 NOTE — Assessment & Plan Note (Signed)
Stable, no problems Plan - cont metoprolol 25 mg TID and pt is now on lovenox for DVT so prophylaxed at this time with lovenox.

## 2015-02-21 NOTE — Assessment & Plan Note (Signed)
12/2014 HB was 10.6, slt decline ; Plan - low nl iron last check, will start FeSO4 325 mg daily and recheck in a month

## 2015-03-19 DIAGNOSIS — I10 Essential (primary) hypertension: Secondary | ICD-10-CM | POA: Diagnosis not present

## 2015-03-30 ENCOUNTER — Non-Acute Institutional Stay (SKILLED_NURSING_FACILITY): Payer: Medicare Other | Admitting: Internal Medicine

## 2015-03-30 ENCOUNTER — Encounter: Payer: Self-pay | Admitting: Internal Medicine

## 2015-03-30 DIAGNOSIS — I82402 Acute embolism and thrombosis of unspecified deep veins of left lower extremity: Secondary | ICD-10-CM

## 2015-03-30 NOTE — Progress Notes (Signed)
MRN: [id number] Name: Cindy Trevino  Sex: female Age: 81 y.o. DOB: 1925/05/10  [place] #: [name] Facility/Room: Level Of Care: SNF Provider: [name] D Emergency Contacts: Extended Emergency Contact Information Primary Emergency Contact: Domingo Mend Address: [address], [place] Phone: [phone] Relation: Daughter  Code Status: DNR  Allergies: Review of patient's allergies indicates no known allergies.  Chief Complaint  Patient presents with  . Medical Management of Chronic Issues    HPI: Patient is 81 y.o. female with dementia, AF, HTN, HLD and DVT who is being seen today for long term management of her DVT which developed while she was on xarelto for PAF.   Past Medical History  Diagnosis Date  . COPD (chronic obstructive pulmonary disease)   . History of breast cancer     L breast  . Arthritis   . Dementia   . Dementia   . Hypertension   . Hyperlipidemia     Past Surgical History  Procedure Laterality Date  . Breast lumpectomy      left breast      Medication List       This list is accurate as of: 03/30/15  8:46 PM.  Always use your most recent med list.               acetaminophen 325 MG tablet  Commonly known as:  TYLENOL  Take 650 mg by mouth every 6 (six) hours as needed.     atorvastatin 40 MG tablet  Commonly known as:  LIPITOR  Take 1 tablet (40 mg total) by mouth daily at 6 PM.     memantine 10 MG tablet  Commonly known as:  NAMENDA  Take 10 mg by mouth 2 (two) times daily.     Menthol (Topical Analgesic) 4 % Gel  Apply topically. Apply to lt knee every 6hours prn for pain.     metoprolol tartrate 25 MG tablet  Commonly known as:  LOPRESSOR  Take 1 tablet (25 mg total) by mouth 3 (three) times daily.     Rivaroxaban 15 MG Tabs tablet  Commonly known as:  XARELTO  Take 1 tablet (15 mg total) by mouth daily with supper.     traMADol 50 MG tablet  Commonly known as:   ULTRAM  Take 50 mg by mouth every 6 (six) hours as needed for moderate pain or severe pain.        No orders of the defined types were placed in this encounter.    Immunization History  Administered Date(s) Administered  . PPD Test 05/08/2014    Social History  Substance Use Topics  . Smoking status: Never Smoker   . Smokeless tobacco: Not on file  . Alcohol Use: No    Review of Systems  DATA OBTAINED: from nurse GENERAL:  no fevers, fatigue, appetite changes SKIN: No itching, rash HEENT: No complaint RESPIRATORY: No cough, wheezing, SOB CARDIAC: No chest pain, palpitations, lower extremity edema  GI: No abdominal pain, No N/V/D or constipation, No heartburn or reflux  GU: No dysuria, frequency or urgency, or incontinence  MUSCULOSKELETAL: No unrelieved bone/joint pain NEUROLOGIC: No headache, dizziness  PSYCHIATRIC: No overt anxiety or sadness  Filed Vitals:   03/30/15 1422  BP: 134/75  Pulse: 75  Temp: 98 F (36.7 C)  Resp: 19    Physical Exam  GENERAL APPEARANCE: Alert, WF, No acute distress  SKIN: No diaphoresis  rash HEENT: Unremarkable RESPIRATORY: Breathing is even, unlabored. Lung sounds are clear   CARDIOVASCULAR: Heart RRR no murmurs, rubs or gallops. No peripheral edema  GASTROINTESTINAL: Abdomen is soft, non-tender, not distended w/ normal bowel sounds.  GENITOURINARY: Bladder non tender, not distended  MUSCULOSKELETAL: No abnormal joints or musculature NEUROLOGIC: Cranial nerves 2-12 grossly intact. Moves all extremities PSYCHIATRIC: dementia,pleasant, no behavioral issues  Patient Active Problem List   Diagnosis Date Noted  . DVT of lower extremity (deep venous thrombosis) 01/23/2015  . OA (osteoarthritis) of knee 09/20/2014  . CKD (chronic kidney disease) stage 3, GFR 30-59 ml/min 08/04/2014  . Anemia of chronic disease 08/04/2014  . Hyperlipidemia   . Essential hypertension 04/20/2014  . Vascular dementia with behavior disturbance  04/20/2014  . Paroxysmal atrial fibrillation 04/20/2014  . Basal ganglia infarction 04/17/2014  . History of breast cancer 08/23/2012    CBC    Component Value Date/Time   WBC 8.5 09/15/2014   WBC 7.9 04/20/2014 0545   WBC 7.6 12/18/2010 1030   RBC 4.04 04/20/2014 0545   RBC 4.19 12/18/2010 1030   HGB 11.2* 09/15/2014   HGB 11.8 12/18/2010 1030   HCT 36 09/15/2014   HCT 35.4 12/18/2010 1030   PLT 192 09/15/2014   PLT 223 12/18/2010 1030   MCV 89.6 04/20/2014 0545   MCV 84.5 12/18/2010 1030   LYMPHSABS 1.5 04/17/2014 1313   LYMPHSABS 1.7 12/18/2010 1030   MONOABS 0.3 04/17/2014 1313   MONOABS 0.4 12/18/2010 1030   EOSABS 0.0 04/17/2014 1313   EOSABS 0.1 12/18/2010 1030   BASOSABS 0.0 04/17/2014 1313   BASOSABS 0.0 12/18/2010 1030    CMP     Component Value Date/Time   NA 140 09/15/2014   NA 139 04/20/2014 0545   K 4.0 09/15/2014   CL 103 04/20/2014 0545   CO2 23 04/20/2014 0545   GLUCOSE 131* 04/20/2014 0545   BUN 23* 09/15/2014   BUN 27* 04/20/2014 0545   CREATININE 1.15* 04/20/2014 0545   CALCIUM 8.8 04/20/2014 0545   PROT 6.6 09/04/2013 2358   ALBUMIN 3.4* 09/04/2013 2358   AST 13 08/07/2014   ALT 9 08/07/2014   ALKPHOS 49 08/07/2014   BILITOT 0.5 09/04/2013 2358   GFRNONAA 42* 04/20/2014 0545   GFRAA 48* 04/20/2014 0545    Assessment and Plan  DVT of lower extremity (deep venous thrombosis) I finally have all the clotting studies; normal are homocysteine, Factor 5,anticardiolipin ab, antithrombin profile, protein C, PT/INR; Protein S functional was 55 (60-145), total and free were normal. Pt developed DVT while on xarelto. Pt was on renal dose 15mg  day; CrCl - 40, Cr 0.9; elect to switch to eliquis for the last month of tx ( 10 mg BID for 7 days then 5 mg BID for rest of October) and for prophylaxis for a year ( 2.5 mg BID to start 11/1). Pt had been on lovenox for 2 months and will transition to Eliquis 10/1.   Time spent > 45 min;> 50% of time with  patient was spent reviewing records, labs, tests and studies, and developing plan of care  Hennie Duos, MD

## 2015-03-30 NOTE — Assessment & Plan Note (Addendum)
I finally have all the clotting studies; normal are homocysteine, Factor 5,anticardiolipin ab, antithrombin profile, protein C, PT/INR; Protein S functional was 55 (60-145), total and free were normal. Pt developed DVT while on xarelto. Pt was on renal dose 15mg  day; CrCl - 40, Cr 0.9; elect to switch to eliquis for the last month of tx ( 10 mg BID for 7 days then 5 mg BID for rest of October) and for prophylaxis for a year ( 2.5 mg BID to start 11/1). Pt had been on lovenox for 2 months and will transition to Eliquis 10/1.

## 2015-05-04 ENCOUNTER — Non-Acute Institutional Stay (SKILLED_NURSING_FACILITY): Payer: Medicare Other | Admitting: Internal Medicine

## 2015-05-04 ENCOUNTER — Encounter: Payer: Self-pay | Admitting: Internal Medicine

## 2015-05-04 DIAGNOSIS — N183 Chronic kidney disease, stage 3 unspecified: Secondary | ICD-10-CM

## 2015-05-04 DIAGNOSIS — F01518 Vascular dementia, unspecified severity, with other behavioral disturbance: Secondary | ICD-10-CM

## 2015-05-04 DIAGNOSIS — E785 Hyperlipidemia, unspecified: Secondary | ICD-10-CM

## 2015-05-04 DIAGNOSIS — F0151 Vascular dementia with behavioral disturbance: Secondary | ICD-10-CM | POA: Diagnosis not present

## 2015-05-04 NOTE — Assessment & Plan Note (Signed)
After last visit  New labs with LDL 83, HDL 26 ; Plan - cont lipitor 40 mg

## 2015-05-04 NOTE — Progress Notes (Signed)
MRN: [id number] Name: Cindy Trevino  Sex: female Age: 81 y.o. DOB: 31-Dec-1924  [place] #: [name] Facility/Room: Level Of Care: SNF Provider: [name] D Emergency Contacts: Extended Emergency Contact Information Primary Emergency Contact: Domingo Mend Address: [address], [place] Phone: [phone] Relation: Daughter  Code Status:   Allergies: Review of patient's allergies indicates no known allergies.  Chief Complaint  Patient presents with  . Medical Management of Chronic Issues    HPI: Patient is 81 y.o. female who is being seen for routine issues of dementia, CKD and HLD.  Past Medical History  Diagnosis Date  . COPD (chronic obstructive pulmonary disease) ([place])   . History of breast cancer     L breast  . Arthritis   . Dementia   . Dementia   . Hypertension   . Hyperlipidemia     Past Surgical History  Procedure Laterality Date  . Breast lumpectomy      left breast      Medication List       This list is accurate as of: 05/04/15  9:04 PM.  Always use your most recent med list.               acetaminophen 325 MG tablet  Commonly known as:  TYLENOL  Take 650 mg by mouth every 6 (six) hours as needed.     atorvastatin 40 MG tablet  Commonly known as:  LIPITOR  Take 1 tablet (40 mg total) by mouth daily at 6 PM.     memantine 10 MG tablet  Commonly known as:  NAMENDA  Take 10 mg by mouth 2 (two) times daily.     Menthol (Topical Analgesic) 4 % Gel  Apply topically. Apply to lt knee every 6hours prn for pain.     metoprolol tartrate 25 MG tablet  Commonly known as:  LOPRESSOR  Take 1 tablet (25 mg total) by mouth 3 (three) times daily.     Rivaroxaban 15 MG Tabs tablet  Commonly known as:  XARELTO  Take 1 tablet (15 mg total) by mouth daily with supper.     traMADol 50 MG tablet  Commonly known as:  ULTRAM  Take 50 mg by mouth every 6 (six) hours as needed for moderate pain or  severe pain.        No orders of the defined types were placed in this encounter.    Immunization History  Administered Date(s) Administered  . PPD Test 05/08/2014    Social History  Substance Use Topics  . Smoking status: Never Smoker   . Smokeless tobacco: Not on file  . Alcohol Use: No    Review of Systems  - UTO from pt 2/2 dementia; nursing without concerns    Filed Vitals:   05/04/15 2053  BP: 134/75  Pulse: 78  Temp: 98 F (36.7 C)  Resp: 19    Physical Exam  GENERAL APPEARANCE: Alert, non conversant, No acute distress  SKIN: No diaphoresis rash HEENT: Unremarkable RESPIRATORY: Breathing is even, unlabored. Lung sounds are clear   CARDIOVASCULAR: Heart RRR no murmurs, rubs or gallops. No peripheral edema  GASTROINTESTINAL: Abdomen is soft, non-tender, not distended w/ normal bowel sounds.  GENITOURINARY: Bladder non tender, not distended  MUSCULOSKELETAL: No abnormal joints or musculature NEUROLOGIC: Cranial nerves 2-12 grossly intact. Moves all extremities PSYCHIATRIC: dementia, no behavioral issues  Patient Active Problem List   Diagnosis Date  Noted  . DVT of lower extremity (deep venous thrombosis) ([place]) 01/23/2015  . OA (osteoarthritis) of knee 09/20/2014  . CKD (chronic kidney disease) stage 3, GFR 30-59 ml/min 08/04/2014  . Anemia of chronic disease 08/04/2014  . Hyperlipidemia   . Essential hypertension 04/20/2014  . Vascular dementia with behavior disturbance 04/20/2014  . Paroxysmal atrial fibrillation ([place]) 04/20/2014  . Basal ganglia infarction ([place]) 04/17/2014  . History of breast cancer 08/23/2012    CBC    Component Value Date/Time   WBC 8.5 09/15/2014   WBC 7.9 04/20/2014 0545   WBC 7.6 12/18/2010 1030   RBC 4.04 04/20/2014 0545   RBC 4.19 12/18/2010 1030   HGB 11.2* 09/15/2014   HGB 11.8 12/18/2010 1030   HCT 36 09/15/2014   HCT 35.4 12/18/2010 1030   PLT 192 09/15/2014   PLT 223 12/18/2010 1030   MCV 89.6 04/20/2014  0545   MCV 84.5 12/18/2010 1030   LYMPHSABS 1.5 04/17/2014 1313   LYMPHSABS 1.7 12/18/2010 1030   MONOABS 0.3 04/17/2014 1313   MONOABS 0.4 12/18/2010 1030   EOSABS 0.0 04/17/2014 1313   EOSABS 0.1 12/18/2010 1030   BASOSABS 0.0 04/17/2014 1313   BASOSABS 0.0 12/18/2010 1030    CMP     Component Value Date/Time   NA 140 09/15/2014   NA 139 04/20/2014 0545   K 4.0 09/15/2014   CL 103 04/20/2014 0545   CO2 23 04/20/2014 0545   GLUCOSE 131* 04/20/2014 0545   BUN 23* 09/15/2014   BUN 27* 04/20/2014 0545   CREATININE 1.15* 04/20/2014 0545   CALCIUM 8.8 04/20/2014 0545   PROT 6.6 09/04/2013 2358   ALBUMIN 3.4* 09/04/2013 2358   AST 13 08/07/2014   ALT 9 08/07/2014   ALKPHOS 49 08/07/2014   BILITOT 0.5 09/04/2013 2358   GFRNONAA 42* 04/20/2014 0545   GFRAA 48* 04/20/2014 0545    Assessment and Plan  CKD (chronic kidney disease) stage 3, GFR 30-59 ml/min In 9/216 GFR 29, BUN/CR 17/1.8 which is a considerable decline. Probably 2/2 age as pt is on no renal toxic meds and has had no acute illnesses to acount for the change;Plan - cont to monitor at intervals and prn change in status  Hyperlipidemia After last visit  New labs with LDL 83, HDL 26 ; Plan - cont lipitor 40 mg  Vascular dementia with behavior disturbance Chronic and stable withou major declines;Plan - cont namenda 10 mg BID    Hennie Duos, MD

## 2015-05-04 NOTE — Assessment & Plan Note (Signed)
Chronic and stable withou major declines;Plan - cont namenda 10 mg BID

## 2015-05-04 NOTE — Assessment & Plan Note (Addendum)
In 9/216 GFR 29, BUN/CR 17/1.8 which is a considerable decline. Probably 2/2 age as pt is on no renal toxic meds and has had no acute illnesses to acount for the change;Plan - cont to monitor at intervals and prn change in status

## 2015-06-05 ENCOUNTER — Non-Acute Institutional Stay (SKILLED_NURSING_FACILITY): Payer: Medicare Other | Admitting: Internal Medicine

## 2015-06-05 ENCOUNTER — Encounter: Payer: Self-pay | Admitting: Internal Medicine

## 2015-06-05 DIAGNOSIS — I10 Essential (primary) hypertension: Secondary | ICD-10-CM

## 2015-06-05 DIAGNOSIS — I82412 Acute embolism and thrombosis of left femoral vein: Secondary | ICD-10-CM

## 2015-06-05 DIAGNOSIS — I480 Paroxysmal atrial fibrillation: Secondary | ICD-10-CM | POA: Diagnosis not present

## 2015-06-05 NOTE — Assessment & Plan Note (Signed)
Chronic and stable; on metoprolol for rate and xarelto as prophylaxis; plan - cont current regimen

## 2015-06-05 NOTE — Assessment & Plan Note (Signed)
Stable; this should be last month she is on Xarelto, dx in 10/2014, 6 month course

## 2015-06-05 NOTE — Assessment & Plan Note (Signed)
Stable and controlled on metoprolol 25 mg BID; plan - cont current regimen

## 2015-06-05 NOTE — Progress Notes (Signed)
MRN: [id number] Name: Cindy Trevino  Sex: female Age: 81 y.o. DOB: 16-Oct-1924  [place] [facility]: [name] Facility/Room: Level Of Care: SNF Provider: [name] D Emergency Contacts: Extended Emergency Contact Information Primary Emergency Contact: Domingo Mend Address: [id number], [place] Phone: [phone] Relation: Daughter  Code Status:   Allergies: Review of patient's allergies indicates no known allergies.  Chief Complaint  Patient presents with  . Medical Management of Chronic Issues    HPI: Patient is 81 y.o. female who is being seen for routine issues of PAF, HTN and DVT.  Past Medical History  Diagnosis Date  . COPD (chronic obstructive pulmonary disease) ([place])   . History of breast cancer     L breast  . Arthritis   . Dementia   . Dementia   . Hypertension   . Hyperlipidemia     Past Surgical History  Procedure Laterality Date  . Breast lumpectomy      left breast      Medication List       This list is accurate as of: 06/05/15  7:52 PM.  Always use your most recent med list.               acetaminophen 325 MG tablet  Commonly known as:  TYLENOL  Take 650 mg by mouth every 6 (six) hours as needed.     atorvastatin 40 MG tablet  Commonly known as:  LIPITOR  Take 1 tablet (40 mg total) by mouth daily at 6 PM.     memantine 10 MG tablet  Commonly known as:  NAMENDA  Take 10 mg by mouth 2 (two) times daily.     Menthol (Topical Analgesic) 4 % Gel  Apply topically. Apply to lt knee every 6hours prn for pain.     metoprolol tartrate 25 MG tablet  Commonly known as:  LOPRESSOR  Take 1 tablet (25 mg total) by mouth 3 (three) times daily.     Rivaroxaban 15 MG Tabs tablet  Commonly known as:  XARELTO  Take 1 tablet (15 mg total) by mouth daily with supper.     traMADol 50 MG tablet  Commonly known as:  ULTRAM  Take 50 mg by mouth every 6 (six) hours as needed for moderate pain or severe  pain.        No orders of the defined types were placed in this encounter.    Immunization History  Administered Date(s) Administered  . PPD Test 05/08/2014    Social History  Substance Use Topics  . Smoking status: Never Smoker   . Smokeless tobacco: Not on file  . Alcohol Use: No    Review of Systems  DATA OBTAINED: from patient, nurse, medical record, family member GENERAL:  no fevers, fatigue, appetite changes SKIN: No itching, rash HEENT: No complaint RESPIRATORY: No cough, wheezing, SOB CARDIAC: No chest pain, palpitations, lower extremity edema  GI: No abdominal pain, No N/V/D or constipation, No heartburn or reflux  GU: No dysuria, frequency or urgency, or incontinence  MUSCULOSKELETAL: No unrelieved bone/joint pain NEUROLOGIC: No headache, dizziness  PSYCHIATRIC: No overt anxiety or sadness  Filed Vitals:   06/05/15 1513  BP: 134/75  Pulse: 86  Temp: 98 F (36.7 C)  Resp: 18    Physical Exam  GENERAL APPEARANCE: Alert, conversant, No acute distress  SKIN: No diaphoresis rash, or wounds HEENT: Unremarkable RESPIRATORY: Breathing is even, unlabored. Lung  sounds are clear   CARDIOVASCULAR: Heart RRR no murmurs, rubs or gallops. No peripheral edema  GASTROINTESTINAL: Abdomen is soft, non-tender, not distended w/ normal bowel sounds.  GENITOURINARY: Bladder non tender, not distended  MUSCULOSKELETAL: No abnormal joints or musculature NEUROLOGIC: Cranial nerves 2-12 grossly intact. Moves all extremities PSYCHIATRIC: Mood and affect appropriate to situation, no behavioral issues  Patient Active Problem List   Diagnosis Date Noted  . DVT of lower extremity (deep venous thrombosis) ([place]) 01/23/2015  . OA (osteoarthritis) of knee 09/20/2014  . CKD (chronic kidney disease) stage 3, GFR 30-59 ml/min 08/04/2014  . Anemia of chronic disease 08/04/2014  . Hyperlipidemia   . Essential hypertension 04/20/2014  . Vascular dementia with behavior disturbance  04/20/2014  . Paroxysmal atrial fibrillation ([place]) 04/20/2014  . Basal ganglia infarction ([place]) 04/17/2014  . History of breast cancer 08/23/2012    CBC    Component Value Date/Time   WBC 8.5 09/15/2014   WBC 7.9 04/20/2014 0545   WBC 7.6 12/18/2010 1030   RBC 4.04 04/20/2014 0545   RBC 4.19 12/18/2010 1030   HGB 11.2* 09/15/2014   HGB 11.8 12/18/2010 1030   HCT 36 09/15/2014   HCT 35.4 12/18/2010 1030   PLT 192 09/15/2014   PLT 223 12/18/2010 1030   MCV 89.6 04/20/2014 0545   MCV 84.5 12/18/2010 1030   LYMPHSABS 1.5 04/17/2014 1313   LYMPHSABS 1.7 12/18/2010 1030   MONOABS 0.3 04/17/2014 1313   MONOABS 0.4 12/18/2010 1030   EOSABS 0.0 04/17/2014 1313   EOSABS 0.1 12/18/2010 1030   BASOSABS 0.0 04/17/2014 1313   BASOSABS 0.0 12/18/2010 1030    CMP     Component Value Date/Time   NA 140 09/15/2014   NA 139 04/20/2014 0545   K 4.0 09/15/2014   CL 103 04/20/2014 0545   CO2 23 04/20/2014 0545   GLUCOSE 131* 04/20/2014 0545   BUN 23* 09/15/2014   BUN 27* 04/20/2014 0545   CREATININE 1.15* 04/20/2014 0545   CALCIUM 8.8 04/20/2014 0545   PROT 6.6 09/04/2013 2358   ALBUMIN 3.4* 09/04/2013 2358   AST 13 08/07/2014   ALT 9 08/07/2014   ALKPHOS 49 08/07/2014   BILITOT 0.5 09/04/2013 2358   GFRNONAA 42* 04/20/2014 0545   GFRAA 48* 04/20/2014 0545    Assessment and Plan  Paroxysmal atrial fibrillation Chronic and stable; on metoprolol for rate and xarelto as prophylaxis; plan - cont current regimen  Essential hypertension Stable and controlled on metoprolol 25 mg BID; plan - cont current regimen  DVT of lower extremity (deep venous thrombosis) Stable; this should be last month she is on Xarelto, dx in 10/2014, 6 month course    Hennie Duos, MD

## 2015-07-04 ENCOUNTER — Encounter: Payer: Self-pay | Admitting: Internal Medicine

## 2015-07-04 ENCOUNTER — Non-Acute Institutional Stay (SKILLED_NURSING_FACILITY): Payer: Medicare Other | Admitting: Internal Medicine

## 2015-07-04 DIAGNOSIS — F419 Anxiety disorder, unspecified: Secondary | ICD-10-CM | POA: Diagnosis not present

## 2015-07-04 DIAGNOSIS — F0151 Vascular dementia with behavioral disturbance: Secondary | ICD-10-CM

## 2015-07-04 DIAGNOSIS — F39 Unspecified mood [affective] disorder: Secondary | ICD-10-CM | POA: Diagnosis not present

## 2015-07-04 DIAGNOSIS — F01518 Vascular dementia, unspecified severity, with other behavioral disturbance: Secondary | ICD-10-CM

## 2015-07-04 HISTORY — DX: Anxiety disorder, unspecified: F41.9

## 2015-07-04 NOTE — Assessment & Plan Note (Signed)
Ativan has been changed vbut will contiue same dose od depakote 125 mg BID

## 2015-07-04 NOTE — Assessment & Plan Note (Signed)
Nursing has reported pt with increase irritability and anxiety which appears improved with ativan; prn ativan has been used almost daily at noon so ativan has been scheduled at this time

## 2015-07-04 NOTE — Assessment & Plan Note (Signed)
Pt had had agitation and irritability recently; ativan has been changed;plan to continue namenda 10 mg BID

## 2015-07-04 NOTE — Progress Notes (Signed)
MRN: [id number] Name: Cindy Trevino  Sex: female Age: 82 y.o. DOB: 07-01-1924  [place] [facility]: [name] Facility/Room: Level Of Care: SNF Provider: [name] D Emergency Contacts: Extended Emergency Contact Information Primary Emergency Contact: Domingo Mend Address: [id number] [place], [place] Phone: [phone] Relation: Daughter  Code Status:   Allergies: Review of patient's allergies indicates no known allergies.  Chief Complaint  Patient presents with  . Medical Management of Chronic Issues    HPI: Patient is 82 y.o. female with dementia, HTN HLD who is being seen for routine issues of anxiety, mood disorder and dementia.  Past Medical History  Diagnosis Date  . COPD (chronic obstructive pulmonary disease) ([place])   . History of breast cancer     L breast  . Arthritis   . Dementia   . Dementia   . Hypertension   . Hyperlipidemia     Past Surgical History  Procedure Laterality Date  . Breast lumpectomy      left breast      Medication List       This list is accurate as of: 07/04/15 11:59 PM.  Always use your most recent med list.               acetaminophen 325 MG tablet  Commonly known as:  TYLENOL  Take 650 mg by mouth every 6 (six) hours as needed.     atorvastatin 40 MG tablet  Commonly known as:  LIPITOR  Take 1 tablet (40 mg total) by mouth daily at 6 PM.     memantine 10 MG tablet  Commonly known as:  NAMENDA  Take 10 mg by mouth 2 (two) times daily.     Menthol (Topical Analgesic) 4 % Gel  Apply topically. Apply to lt knee every 6hours prn for pain.     metoprolol tartrate 25 MG tablet  Commonly known as:  LOPRESSOR  Take 1 tablet (25 mg total) by mouth 3 (three) times daily.     Rivaroxaban 15 MG Tabs tablet  Commonly known as:  XARELTO  Take 1 tablet (15 mg total) by mouth daily with supper.     traMADol 50 MG tablet  Commonly known as:  ULTRAM  Take 50 mg by mouth every 6 (six)  hours as needed for moderate pain or severe pain.        No orders of the defined types were placed in this encounter.    Immunization History  Administered Date(s) Administered  . PPD Test 05/08/2014    Social History  Substance Use Topics  . Smoking status: Never Smoker   . Smokeless tobacco: Not on file  . Alcohol Use: No    Review of Systems  UTO 2/2 dementia    Filed Vitals:   07/04/15 1359  BP: 134/75  Pulse: 80  Temp: 97.9 F (36.6 C)  Resp: 17    Physical Exam  GENERAL APPEARANCE: Alert, nonconversant, No acute distress; WF in WC SKIN: No diaphoresis rash, or wounds HEENT: Unremarkable RESPIRATORY: Breathing is even, unlabored. Lung sounds are clear   CARDIOVASCULAR: Heart RRR no murmurs, rubs or gallops. No peripheral edema  GASTROINTESTINAL: Abdomen is soft, non-tender, not distended w/ normal bowel sounds.  GENITOURINARY: Bladder non tender, not distended  MUSCULOSKELETAL: No abnormal joints or musculature NEUROLOGIC: Cranial nerves 2-12 grossly intact. Moves all extremities PSYCHIATRIC: dementia, no behavioral issues  Patient Active Problem List   Diagnosis  Date Noted  . Anxiety 07/04/2015  . Mood disorder ([place]) 07/04/2015  . DVT of lower extremity (deep venous thrombosis) ([place]) 01/23/2015  . OA (osteoarthritis) of knee 09/20/2014  . CKD (chronic kidney disease) stage 3, GFR 30-59 ml/min 08/04/2014  . Anemia of chronic disease 08/04/2014  . Hyperlipidemia   . Essential hypertension 04/20/2014  . Vascular dementia with behavior disturbance 04/20/2014  . Paroxysmal atrial fibrillation ([place]) 04/20/2014  . Basal ganglia infarction ([place]) 04/17/2014  . History of breast cancer 08/23/2012    CBC    Component Value Date/Time   WBC 8.5 09/15/2014   WBC 7.9 04/20/2014 0545   WBC 7.6 12/18/2010 1030   RBC 4.04 04/20/2014 0545   RBC 4.19 12/18/2010 1030   HGB 11.2* 09/15/2014   HGB 11.8 12/18/2010 1030   HCT 36 09/15/2014   HCT 35.4 12/18/2010  1030   PLT 192 09/15/2014   PLT 223 12/18/2010 1030   MCV 89.6 04/20/2014 0545   MCV 84.5 12/18/2010 1030   LYMPHSABS 1.5 04/17/2014 1313   LYMPHSABS 1.7 12/18/2010 1030   MONOABS 0.3 04/17/2014 1313   MONOABS 0.4 12/18/2010 1030   EOSABS 0.0 04/17/2014 1313   EOSABS 0.1 12/18/2010 1030   BASOSABS 0.0 04/17/2014 1313   BASOSABS 0.0 12/18/2010 1030    CMP     Component Value Date/Time   NA 140 09/15/2014   NA 139 04/20/2014 0545   K 4.0 09/15/2014   CL 103 04/20/2014 0545   CO2 23 04/20/2014 0545   GLUCOSE 131* 04/20/2014 0545   BUN 23* 09/15/2014   BUN 27* 04/20/2014 0545   CREATININE 1.15* 04/20/2014 0545   CALCIUM 8.8 04/20/2014 0545   PROT 6.6 09/04/2013 2358   ALBUMIN 3.4* 09/04/2013 2358   AST 13 08/07/2014   ALT 9 08/07/2014   ALKPHOS 49 08/07/2014   BILITOT 0.5 09/04/2013 2358   GFRNONAA 42* 04/20/2014 0545   GFRAA 48* 04/20/2014 0545    Assessment and Plan  Anxiety Nursing has reported pt with increase irritability and anxiety which appears improved with ativan; prn ativan has been used almost daily at noon so ativan has been scheduled at this time  Mood disorder ([place]) Ativan has been changed vbut will contiue same dose od depakote 125 mg BID  Vascular dementia with behavior disturbance Pt had had agitation and irritability recently; ativan has been changed;plan to continue namenda 10 mg BID    Hennie Duos, MD

## 2015-08-10 LAB — HEPATIC FUNCTION PANEL
ALK PHOS: 55 U/L (ref 25–125)
ALT: 11 U/L (ref 7–35)
AST: 16 U/L (ref 13–35)
Bilirubin, Total: 0.1 mg/dL

## 2015-08-15 ENCOUNTER — Non-Acute Institutional Stay (SKILLED_NURSING_FACILITY): Payer: Medicare Other | Admitting: Internal Medicine

## 2015-08-15 ENCOUNTER — Encounter: Payer: Self-pay | Admitting: Internal Medicine

## 2015-08-15 DIAGNOSIS — I480 Paroxysmal atrial fibrillation: Secondary | ICD-10-CM | POA: Diagnosis not present

## 2015-08-15 DIAGNOSIS — I639 Cerebral infarction, unspecified: Secondary | ICD-10-CM | POA: Diagnosis not present

## 2015-08-15 DIAGNOSIS — I6381 Other cerebral infarction due to occlusion or stenosis of small artery: Secondary | ICD-10-CM

## 2015-08-15 DIAGNOSIS — I82412 Acute embolism and thrombosis of left femoral vein: Secondary | ICD-10-CM

## 2015-08-15 NOTE — Progress Notes (Signed)
MRN: [id number] Name: Cory Roughen  Sex: female Age: 82 y.o. DOB: Aug 25, 1924  [place] [facility]: [name] Facility/Room: Level Of Care: SNF Provider: [name] D Emergency Contacts: Extended Emergency Contact Information Primary Emergency Contact: Domingo Mend Address: [id number] [place], [place] Phone: [phone] Relation: Daughter  Code Status:   Allergies: Review of patient's allergies indicates no known allergies.  Chief Complaint  Patient presents with  . Medical Management of Chronic Issues    HPI: Patient is 82 y.o. female with dementia who is being seen for routine issues of DVT, A Fib and s/p CVA.   Past Medical History  Diagnosis Date  . COPD (chronic obstructive pulmonary disease) ([place])   . History of breast cancer     L breast  . Arthritis   . Dementia   . Dementia   . Hypertension   . Hyperlipidemia     Past Surgical History  Procedure Laterality Date  . Breast lumpectomy      left breast      Medication List       This list is accurate as of: 08/15/15 11:59 PM.  Always use your most recent med list.               acetaminophen 325 MG tablet  Commonly known as:  TYLENOL  Take 650 mg by mouth every 6 (six) hours as needed.     atorvastatin 40 MG tablet  Commonly known as:  LIPITOR  Take 1 tablet (40 mg total) by mouth daily at 6 PM.     ELIQUIS 2.5 MG Tabs tablet  Generic drug:  apixaban  Take 2.5 mg by mouth 2 (two) times daily.     memantine 10 MG tablet  Commonly known as:  NAMENDA  Take 10 mg by mouth 2 (two) times daily.     Menthol (Topical Analgesic) 4 % Gel  Apply topically. Apply to lt knee every 6hours prn for pain.     metoprolol tartrate 25 MG tablet  Commonly known as:  LOPRESSOR  Take 1 tablet (25 mg total) by mouth 3 (three) times daily.     traMADol 50 MG tablet  Commonly known as:  ULTRAM  Take 50 mg by mouth every 6 (six) hours as needed for moderate pain or severe  pain.        Meds ordered this encounter  Medications  . apixaban (ELIQUIS) 2.5 MG TABS tablet    Sig: Take 2.5 mg by mouth 2 (two) times daily.    Immunization History  Administered Date(s) Administered  . PPD Test 05/08/2014    Social History  Substance Use Topics  . Smoking status: Never Smoker   . Smokeless tobacco: Not on file  . Alcohol Use: No    Review of Systems UTO 2/2 dementia; nursing without concerns   Filed Vitals:   08/15/15 1520  BP: 116/74  Pulse: 61  Temp: 98.1 F (36.7 C)  Resp: 16    Physical Exam  GENERAL APPEARANCE: Alert, min conversant, pleasant WF in [facility] acute distress  SKIN: No diaphoresis rash HEENT: Unremarkable RESPIRATORY: Breathing is even, unlabored. Lung sounds are clear   CARDIOVASCULAR: Heart RRR no murmurs, rubs or gallops. No peripheral edema  GASTROINTESTINAL: Abdomen is soft, non-tender, not distended w/ normal bowel sounds.  GENITOURINARY: Bladder non tender, not distended  MUSCULOSKELETAL: No abnormal joints or musculature NEUROLOGIC: Cranial nerves 2-12 grossly intact. Moves all  extremities PSYCHIATRIC: dementia, no behavioral issues  Patient Active Problem List   Diagnosis Date Noted  . Anxiety 07/04/2015  . Mood disorder ([place]) 07/04/2015  . DVT of lower extremity (deep venous thrombosis) ([place]) 01/23/2015  . OA (osteoarthritis) of knee 09/20/2014  . CKD (chronic kidney disease) stage 3, GFR 30-59 ml/min 08/04/2014  . Anemia of chronic disease 08/04/2014  . Hyperlipidemia   . Essential hypertension 04/20/2014  . Vascular dementia with behavior disturbance 04/20/2014  . Paroxysmal atrial fibrillation ([place]) 04/20/2014  . Basal ganglia infarction ([place]) 04/17/2014  . History of breast cancer 08/23/2012    CBC    Component Value Date/Time   WBC 8.5 09/15/2014   WBC 7.9 04/20/2014 0545   WBC 7.6 12/18/2010 1030   RBC 4.04 04/20/2014 0545   RBC 4.19 12/18/2010 1030   HGB 11.2* 09/15/2014   HGB 11.8 12/18/2010  1030   HCT 36 09/15/2014   HCT 35.4 12/18/2010 1030   PLT 192 09/15/2014   PLT 223 12/18/2010 1030   MCV 89.6 04/20/2014 0545   MCV 84.5 12/18/2010 1030   LYMPHSABS 1.5 04/17/2014 1313   LYMPHSABS 1.7 12/18/2010 1030   MONOABS 0.3 04/17/2014 1313   MONOABS 0.4 12/18/2010 1030   EOSABS 0.0 04/17/2014 1313   EOSABS 0.1 12/18/2010 1030   BASOSABS 0.0 04/17/2014 1313   BASOSABS 0.0 12/18/2010 1030    CMP     Component Value Date/Time   NA 140 09/15/2014   NA 139 04/20/2014 0545   K 4.0 09/15/2014   CL 103 04/20/2014 0545   CO2 23 04/20/2014 0545   GLUCOSE 131* 04/20/2014 0545   BUN 23* 09/15/2014   BUN 27* 04/20/2014 0545   CREATININE 1.15* 04/20/2014 0545   CALCIUM 8.8 04/20/2014 0545   PROT 6.6 09/04/2013 2358   ALBUMIN 3.4* 09/04/2013 2358   AST 13 08/07/2014   ALT 9 08/07/2014   ALKPHOS 49 08/07/2014   BILITOT 0.5 09/04/2013 2358   GFRNONAA 42* 04/20/2014 0545   GFRAA 48* 04/20/2014 0545    Assessment and Plan  DVT of lower extremity (deep venous thrombosis) The last entry was an error;pt was dx 01/22/2015 while on Xarelto and pt was put on lovenox initially while clotting studies were done which took several months at least since they were ordered out of a SNF. Pt was transitioned to Eliquis and has done well. At the end of February she will have been treated for 6 months. I have decided to keep her on eliquis for prophylaxis of her a fib and CVA.  Paroxysmal atrial fibrillation Correction to last ,meds in EPIC were not updated; pt has been on eliquis for prophylaxis and will continue along with metoprolol for rate  Basal ganglia infarction Pt was initially on xarelto, then eliquis for prophylaxis; will cont eliquis for prophylaxis; pt has had no no stroke sx    Hennie Duos, MD

## 2015-08-18 ENCOUNTER — Encounter: Payer: Self-pay | Admitting: Internal Medicine

## 2015-08-18 NOTE — Assessment & Plan Note (Signed)
Correction to last ,meds in EPIC were not updated; pt has been on eliquis for prophylaxis and will continue along with metoprolol for rate

## 2015-08-18 NOTE — Assessment & Plan Note (Signed)
Pt was initially on xarelto, then eliquis for prophylaxis; will cont eliquis for prophylaxis; pt has had no no stroke sx

## 2015-08-18 NOTE — Assessment & Plan Note (Signed)
The last entry was an error;pt was dx 01/22/2015 while on Xarelto and pt was put on lovenox initially while clotting studies were done which took several months at least since they were ordered out of a SNF. Pt was transitioned to Eliquis and has done well. At the end of February she will have been treated for 6 months. I have decided to keep her on eliquis for prophylaxis of her a fib and CVA.

## 2015-09-05 ENCOUNTER — Other Ambulatory Visit: Payer: Self-pay | Admitting: *Deleted

## 2015-09-05 MED ORDER — TRAMADOL HCL 50 MG PO TABS: 50.0000 mg | ORAL_TABLET | Freq: Four times a day (QID) | ORAL | Status: DC | PRN

## 2015-09-05 NOTE — Telephone Encounter (Signed)
[facility] 

## 2015-09-10 ENCOUNTER — Encounter: Payer: Self-pay | Admitting: Internal Medicine

## 2015-09-10 ENCOUNTER — Non-Acute Institutional Stay (SKILLED_NURSING_FACILITY): Payer: Medicare Other | Admitting: Internal Medicine

## 2015-09-10 DIAGNOSIS — I480 Paroxysmal atrial fibrillation: Secondary | ICD-10-CM | POA: Diagnosis not present

## 2015-09-10 DIAGNOSIS — F01518 Vascular dementia, unspecified severity, with other behavioral disturbance: Secondary | ICD-10-CM

## 2015-09-10 DIAGNOSIS — F0151 Vascular dementia with behavioral disturbance: Secondary | ICD-10-CM | POA: Diagnosis not present

## 2015-09-10 DIAGNOSIS — R197 Diarrhea, unspecified: Secondary | ICD-10-CM | POA: Diagnosis not present

## 2015-09-10 DIAGNOSIS — N183 Chronic kidney disease, stage 3 unspecified: Secondary | ICD-10-CM

## 2015-09-10 DIAGNOSIS — I10 Essential (primary) hypertension: Secondary | ICD-10-CM

## 2015-09-10 NOTE — Progress Notes (Signed)
Patient ID: Cindy Trevino, female   DOB: 01-01-1925, 82 y.o.   MRN: [id number] MRN: [id number] Name: Cindy Trevino  Sex: female Age: 82 y.o. DOB: 15-Mar-1925  [place] [name] Facility/Room: Level Of Care: SNF Provider: Wille Celeste Emergency Contacts: Extended Emergency Contact Information Primary Emergency Contact: Domingo Mend Address: [id number] [place], [place] Phone: [phone] Relation: Daughter  Code Status:   Allergies: Review of patient's allergies indicates no known allergies.  Chief Complaint  Patient presents with  . Acute Visit  . Medical Management of Chronic Issues   acute visit secondary to diarrhea  HPI: Patient is 82 y.o. female with dementia who is being seen for routine issues of DVT, A Fib and s/p CVA.--Anemia dementia-acute visit secondary to apparently intermittent diarrhea  Per nursing staff she continues to be relatively stable at her baseline she appears to be doing well with supportive care.  She does have a history of DVT as well as atrial fibrillation continues on Eliquis.  . Nursing  does report she has diarrhea intermittently interestingly they say this appears to occur only on Fridays --apparently there have been no dietary changes on Fridays which would lead one to suspect this is dietary related.  She does not complain of any abdominal pain she is a poor historian secondary to dementia.  Her vital signs continued to be stable  Past Medical History  Diagnosis Date  . COPD (chronic obstructive pulmonary disease) ([place])   . History of breast cancer     L breast  . Arthritis   . Dementia   . Dementia   . Hypertension   . Hyperlipidemia     Past Surgical History  Procedure Laterality Date  . Breast lumpectomy      left breast      Medication List       This list is accurate as of: 09/10/15 11:59 PM.  Always use your most recent med list.               acetaminophen 325 MG  tablet  Commonly known as:  TYLENOL  Take 650 mg by mouth every 6 (six) hours as needed.     atorvastatin 40 MG tablet  Commonly known as:  LIPITOR  Take 1 tablet (40 mg total) by mouth daily at 6 PM.     ELIQUIS 2.5 MG Tabs tablet  Generic drug:  apixaban  Take 2.5 mg by mouth 2 (two) times daily.     ferrous sulfate 325 (65 FE) MG tablet  Take 325 mg by mouth daily with breakfast.     memantine 10 MG tablet  Commonly known as:  NAMENDA  Take 10 mg by mouth 2 (two) times daily.     Menthol (Topical Analgesic) 4 % Gel  Apply topically. Apply to lt knee every 6hours prn for pain.     metoprolol tartrate 25 MG tablet  Commonly known as:  LOPRESSOR  Take 1 tablet (25 mg total) by mouth 3 (three) times daily.     traMADol 50 MG tablet  Commonly known as:  ULTRAM  Take 1 tablet (50 mg total) by mouth every 6 (six) hours as needed for moderate pain or severe pain.        Meds ordered this encounter  Medications  . ferrous sulfate 325 (65 FE) MG tablet    Sig: Take 325 mg by mouth daily with breakfast.  Immunization History  Administered Date(s) Administered  . PPD Test 05/08/2014    Social History  Substance Use Topics  . Smoking status: Never Smoker   . Smokeless tobacco: Not on file  . Alcohol Use: No    Review of Systems UTO 2/2 dementia; nursing without concerns other than reporting occasional diarrhea   Filed Vitals:   09/10/15 2121  BP: 110/84  Pulse: 72  Temp: 97.1 F (36.2 C)  Resp: 18    Physical Exam  GENERAL APPEARANCE: Alert, min conversant, pleasant WF in [facility] acute distress  SKIN: No diaphoresis rash HEENT: Unremarkable oropharynx clear mucous membranes moist RESPIRATORY: Breathing is even, unlabored. Lung sounds are clear   CARDIOVASCULAR: Heart RRR no murmurs, rubs or gallops. No peripheral edema  GASTROINTESTINAL: Abdomen is soft, non-tender, not distended w/ normal bowel sounds.  GENITOURINARY: Bladder non tender, not distended   MUSCULOSKELETAL: No abnormal joints or musculature NEUROLOGIC: Cranial nerves 2-12 grossly intact. Moves all extremities PSYCHIATRIC: dementia, no behavioral issues pleasant smiling  Patient Active Problem List   Diagnosis Date Noted  . Diarrhea 09/10/2015  . Anxiety 07/04/2015  . Mood disorder ([place]) 07/04/2015  . DVT of lower extremity (deep venous thrombosis) ([place]) 01/23/2015  . OA (osteoarthritis) of knee 09/20/2014  . CKD (chronic kidney disease) stage 3, GFR 30-59 ml/min 08/04/2014  . Anemia of chronic disease 08/04/2014  . Hyperlipidemia   . Essential hypertension 04/20/2014  . Vascular dementia with behavior disturbance 04/20/2014  . Paroxysmal atrial fibrillation ([place]) 04/20/2014  . Basal ganglia infarction ([place]) 04/17/2014  . History of breast cancer 08/23/2012     Labs.  08/16/2015.  Albumin 3.2 otherwise liver function tests within normal limits.  03/19/2015.  Sodium 135 potassium 4.2 BUN 17 creatinine 1.8.  TSH-2.25.  WBC 6.6 hemoglobin 9.2 platelets 159 CBC    Component Value Date/Time   WBC 8.5 09/15/2014   WBC 7.9 04/20/2014 0545   WBC 7.6 12/18/2010 1030   RBC 4.04 04/20/2014 0545   RBC 4.19 12/18/2010 1030   HGB 11.2* 09/15/2014   HGB 11.8 12/18/2010 1030   HCT 36 09/15/2014   HCT 35.4 12/18/2010 1030   PLT 192 09/15/2014   PLT 223 12/18/2010 1030   MCV 89.6 04/20/2014 0545   MCV 84.5 12/18/2010 1030   LYMPHSABS 1.5 04/17/2014 1313   LYMPHSABS 1.7 12/18/2010 1030   MONOABS 0.3 04/17/2014 1313   MONOABS 0.4 12/18/2010 1030   EOSABS 0.0 04/17/2014 1313   EOSABS 0.1 12/18/2010 1030   BASOSABS 0.0 04/17/2014 1313   BASOSABS 0.0 12/18/2010 1030    CMP     Component Value Date/Time   NA 140 09/15/2014   NA 139 04/20/2014 0545   K 4.0 09/15/2014   CL 103 04/20/2014 0545   CO2 23 04/20/2014 0545   GLUCOSE 131* 04/20/2014 0545   BUN 23* 09/15/2014   BUN 27* 04/20/2014 0545   CREATININE 1.15* 04/20/2014 0545   CALCIUM 8.8 04/20/2014  0545   PROT 6.6 09/04/2013 2358   ALBUMIN 3.4* 09/04/2013 2358   AST 13 08/07/2014   ALT 9 08/07/2014   ALKPHOS 49 08/07/2014   BILITOT 0.5 09/04/2013 2358   GFRNONAA 42* 04/20/2014 0545   GFRAA 48* 04/20/2014 0545    Assessment and Plan  Diarrhea-apparently this is somewhat perplexing and intermittent-we'll check an abdominal x-ray as well as stool culture and a C. difficile culture although this does not appear to be a typical C. difficile presentation today she appears to be clinically at her  baseline.   history of atrial fibrillation this is rate controlled on Lopressor she is on Eliquis for anticoagulation.  History of DVT-apparently this was back in July 2016-this has been assessed by Dr. Sheppard Coil she again does continue on Eliquis for prophylaxis with her history of A. fib and CVA.  History of basal ganglia infarction-[place] treated on Xarelto now is on Eliquis.  She appears to have had minimal residual effect here--she continues on a statin as well  Renal insufficiency apparently creatinine 1.8 has been her recent baseline Will update this.  History of anemia she is on iron-at this point will monitor most recent hemoglobin 9.2 back in September.  History of dementia-she does have a when necessary Ativan order for situational anxiety-she also continues on Namenda she appears to be doing relatively well and supportive care.  I also note she is on low-dose Depakote twice a day I suspect for mood stabilization.  Pain management she continues on tramadol 50 mg every 6 hours when necessary apparently this is quite effective.  [id number] note greater than 35 minutes spent assessing patient-discussing her status with nursing staff-reviewing her chart-and coordinating formulating a plan of care for numerous diagnoses-no greater than 50% of time spent coordinating plan of care    ,

## 2015-09-12 LAB — BASIC METABOLIC PANEL
BUN: 28 mg/dL — AB (ref 4–21)
Creatinine: 1 mg/dL (ref 0.5–1.1)
GLUCOSE: 75 mg/dL
Potassium: 4.5 mmol/L (ref 3.4–5.3)
SODIUM: 139 mmol/L (ref 137–147)

## 2015-09-12 LAB — HEPATIC FUNCTION PANEL
ALT: 9 U/L (ref 7–35)
AST: 14 U/L (ref 13–35)
Alkaline Phosphatase: 39 U/L (ref 25–125)
BILIRUBIN, TOTAL: 0.5 mg/dL

## 2015-09-12 LAB — CBC AND DIFFERENTIAL
HEMATOCRIT: 36 % (ref 36–46)
Hemoglobin: 11.4 g/dL — AB (ref 12.0–16.0)
PLATELETS: 226 10*3/uL (ref 150–399)
WBC: 7.3 10*3/mL

## 2015-09-28 ENCOUNTER — Non-Acute Institutional Stay (SKILLED_NURSING_FACILITY): Payer: Medicare Other | Admitting: Internal Medicine

## 2015-09-28 DIAGNOSIS — S058X1A Other injuries of right eye and orbit, initial encounter: Secondary | ICD-10-CM | POA: Diagnosis not present

## 2015-09-28 DIAGNOSIS — S0511XA Contusion of eyeball and orbital tissues, right eye, initial encounter: Secondary | ICD-10-CM

## 2015-10-02 ENCOUNTER — Encounter: Payer: Self-pay | Admitting: Internal Medicine

## 2015-10-02 NOTE — Progress Notes (Deleted)
MRN: [id number] Name: Cory Roughen  Sex: female Age: 82 y.o. DOB: 08/08/1924  [place] #:  Facility/Room: Level Of Care: SNF Provider: [name] D Emergency Contacts: Extended Emergency Contact Information Primary Emergency Contact: Domingo Mend Address: [id number],  [place] Phone: [phone] Relation: Daughter  Code Status:   Allergies: Review of patient's allergies indicates no known allergies.  Chief Complaint  Patient presents with  . Acute Visit    HPI: Patient is 82 y.o. female who  Past Medical History  Diagnosis Date  . COPD (chronic obstructive pulmonary disease) ([place])   . History of breast cancer     L breast  . Arthritis   . Dementia   . Dementia   . Hypertension   . Hyperlipidemia     Past Surgical History  Procedure Laterality Date  . Breast lumpectomy      left breast      Medication List       This list is accurate as of: 09/28/15 11:59 PM.  Always use your most recent med list.               acetaminophen 325 MG tablet  Commonly known as:  TYLENOL  Take 650 mg by mouth every 6 (six) hours as needed.     atorvastatin 40 MG tablet  Commonly known as:  LIPITOR  Take 1 tablet (40 mg total) by mouth daily at 6 PM.     divalproex 125 MG capsule  Commonly known as:  DEPAKOTE SPRINKLE  Take 125 mg by mouth 2 (two) times daily.     ELIQUIS 2.5 MG Tabs tablet  Generic drug:  apixaban  Take 2.5 mg by mouth 2 (two) times daily.     feeding supplement (PRO-STAT SUGAR FREE 64) Liqd  Take 30 mLs by mouth daily.     ferrous sulfate 325 (65 FE) MG tablet  Take 325 mg by mouth daily with breakfast.     loperamide 2 MG capsule  Commonly known as:  IMODIUM  Give 1 capsule after each loose stool PRN. Not to exceed 8 mg in 24 hour period.     LORazepam 0.5 MG tablet  Commonly known as:  ATIVAN  Take 0.5 mg by mouth 2 (two) times daily.     memantine 10 MG tablet  Commonly known as:   NAMENDA  Take 10 mg by mouth 2 (two) times daily.     Menthol (Topical Analgesic) 4 % Gel  Apply topically. Apply to lt knee every 6hours prn for pain.     metoprolol tartrate 25 MG tablet  Commonly known as:  LOPRESSOR  Take 1 tablet (25 mg total) by mouth 3 (three) times daily.     PRESERVISION AREDS 2 PO  Take 1 tablet by mouth 2 (two) times daily.     traMADol 50 MG tablet  Commonly known as:  ULTRAM  Take 1 tablet (50 mg total) by mouth every 6 (six) hours as needed for moderate pain or severe pain.        No orders of the defined types were placed in this encounter.    Immunization History  Administered Date(s) Administered  . Influenza-Unspecified 03/22/2015  . PPD Test 05/08/2014    Social History  Substance Use Topics  . Smoking status: Never Smoker   . Smokeless tobacco: Not on file  . Alcohol Use: No    Family history is  Review of Systems  DATA OBTAINED: from patient, nurse, medical record, family member GENERAL:  no fevers, fatigue, appetite changes SKIN: No itching, rash or wounds EYES: No eye pain, redness, discharge EARS: No earache, tinnitus, change in hearing NOSE: No congestion, drainage or bleeding  MOUTH/THROAT: No mouth or tooth pain, No sore throat RESPIRATORY: No cough, wheezing, SOB CARDIAC: No chest pain, palpitations, lower extremity edema  GI: No abdominal pain, No N/V/D or constipation, No heartburn or reflux  GU: No dysuria, frequency or urgency, or incontinence  MUSCULOSKELETAL: No unrelieved bone/joint pain NEUROLOGIC: No headache, dizziness or focal weakness PSYCHIATRIC: No c/o anxiety or sadness   Filed Vitals:   10/02/15 2207  BP: 116/74  Pulse: 81  Temp: 98.1 F (36.7 C)  Resp: 16    SpO2 Readings from Last 1 Encounters:  04/21/14 99%        Physical Exam  GENERAL APPEARANCE: Alert, conversant,  No acute distress.  SKIN: No diaphoresis rash HEAD: Normocephalic, atraumatic  EYES: Conjunctiva/lids clear.  Pupils round, reactive. EOMs intact.  EARS: External exam WNL, canals clear. Hearing grossly normal.  NOSE: No deformity or discharge.  MOUTH/THROAT: Lips w/o lesions  RESPIRATORY: Breathing is even, unlabored. Lung sounds are clear   CARDIOVASCULAR: Heart RRR no murmurs, rubs or gallops. No peripheral edema.   GASTROINTESTINAL: Abdomen is soft, non-tender, not distended w/ normal bowel sounds. GENITOURINARY: Bladder non tender, not distended  MUSCULOSKELETAL: No abnormal joints or musculature NEUROLOGIC:  Cranial nerves 2-12 grossly intact. Moves all extremities  PSYCHIATRIC: Mood and affect appropriate to situation, no behavioral issues  Patient Active Problem List   Diagnosis Date Noted  . Diarrhea 09/10/2015  . Anxiety 07/04/2015  . Mood disorder ([place]) 07/04/2015  . DVT of lower extremity (deep venous thrombosis) ([place]) 01/23/2015  . OA (osteoarthritis) of knee 09/20/2014  . CKD (chronic kidney disease) stage 3, GFR 30-59 ml/min 08/04/2014  . Anemia of chronic disease 08/04/2014  . Hyperlipidemia   . Essential hypertension 04/20/2014  . Vascular dementia with behavior disturbance 04/20/2014  . Paroxysmal atrial fibrillation ([place]) 04/20/2014  . Basal ganglia infarction ([place]) 04/17/2014  . History of breast cancer 08/23/2012    CBC    Component Value Date/Time   WBC 8.5 09/15/2014   WBC 7.9 04/20/2014 0545   WBC 7.6 12/18/2010 1030   RBC 4.04 04/20/2014 0545   RBC 4.19 12/18/2010 1030   HGB 11.2* 09/15/2014   HGB 11.8 12/18/2010 1030   HCT 36 09/15/2014   HCT 35.4 12/18/2010 1030   PLT 192 09/15/2014   PLT 223 12/18/2010 1030   MCV 89.6 04/20/2014 0545   MCV 84.5 12/18/2010 1030   LYMPHSABS 1.5 04/17/2014 1313   LYMPHSABS 1.7 12/18/2010 1030   MONOABS 0.3 04/17/2014 1313   MONOABS 0.4 12/18/2010 1030   EOSABS 0.0 04/17/2014 1313   EOSABS 0.1 12/18/2010 1030   BASOSABS 0.0 04/17/2014 1313   BASOSABS 0.0 12/18/2010 1030    CMP     Component Value Date/Time    NA 140 09/15/2014   NA 139 04/20/2014 0545   K 4.0 09/15/2014   CL 103 04/20/2014 0545   CO2 23 04/20/2014 0545   GLUCOSE 131* 04/20/2014 0545   BUN 23* 09/15/2014   BUN 27* 04/20/2014 0545   CREATININE 1.15* 04/20/2014 0545   CALCIUM 8.8 04/20/2014 0545   PROT 6.6 09/04/2013 2358   ALBUMIN 3.4* 09/04/2013 2358   AST 16 08/10/2015   ALT 11 08/10/2015   ALKPHOS 55 08/10/2015  BILITOT 0.5 09/04/2013 2358   GFRNONAA 42* 04/20/2014 0545   GFRAA 48* 04/20/2014 0545    Lab Results  Component Value Date   HGBA1C 5.7* 04/18/2014     Dg Chest 2 View  04/17/2014  CLINICAL DATA:  Altered mental status. EXAM: CHEST  2 VIEW COMPARISON:  None. FINDINGS: The heart size and mediastinal contours are within normal limits. Both lungs are clear. No pneumothorax or pleural effusion is noted. The visualized skeletal structures are unremarkable. IMPRESSION: No acute cardiopulmonary abnormality seen. Electronically Signed   By: Sabino Dick M.D.   On: 04/17/2014 14:30   Dg Lumbar Spine Complete  04/17/2014  CLINICAL DATA:  Altered mental status. EXAM: LUMBAR SPINE - COMPLETE 4+ VIEW COMPARISON:  None. FINDINGS: No fracture is noted. Grade 1 anterolisthesis of L3-4 is noted secondary to posterior facet joint hypertrophy. Moderate degenerative disc disease is noted at this level. Severe degenerative disc disease is noted at L4-5 and L5-S1. Lumbarized S1 vertebral body is noted. IMPRESSION: Severe multilevel degenerative disc disease is noted. No acute abnormality seen in the lumbar spine. Electronically Signed   By: Sabino Dick M.D.   On: 04/17/2014 14:34   Ct Head Wo Contrast  04/17/2014  CLINICAL DATA:  Initial encounter for fall yesterday with possible slurred speech and left arm weakness. EXAM: CT HEAD WITHOUT CONTRAST CT CERVICAL SPINE WITHOUT CONTRAST TECHNIQUE: Multidetector CT imaging of the head and cervical spine was performed following the standard protocol without intravenous contrast.  Multiplanar CT image reconstructions of the cervical spine were also generated. COMPARISON:  09/04/2013. FINDINGS: CT HEAD FINDINGS There is no evidence for acute hemorrhage, hydrocephalus, mass lesion, or abnormal extra-axial fluid collection. Patchy low attenuation in the deep hemispheric and periventricular white matter is nonspecific, but likely reflects chronic microvascular ischemic demyelination. Chronic bilateral lacunar infarcts are seen in the basal ganglia. On image the 16, there is a new hypodensity in the left containment which has imaging features compatible with age indeterminate infarct although it does appear new since 09/04/2013. Diffuse loss of parenchymal volume is consistent with atrophy. The visualized paranasal sinuses and mastoid air cells are clear. No evidence for skull fracture. CT CERVICAL SPINE FINDINGS Imaging was obtained from the skullbase through the T1 vertebral body. There is no evidence for an acute fracture. Marked loss of disc height is seen at C2-3. There is loss of disc height with endplate degeneration at C6-7 and to a lesser degree at C5-6. Trace anterolisthesis of C4-5 is compatible with the degree of facet degeneration at this level. Similar trace anterolisthesis is seen at C5-6, stable. There is diffuse facet osteoarthritis throughout bilaterally. No prevertebral soft tissue swelling. IMPRESSION: Atrophy with chronic small vessel white matter ischemic disease. There is a new (since 09/04/2013) age indeterminate lacunar infarct in the left basal ganglia. No cervical spine fracture although diffuse degenerative changes are evident. Electronically Signed   By: Misty Stanley M.D.   On: 04/17/2014 14:31   Ct Cervical Spine Wo Contrast  04/17/2014  CLINICAL DATA:  Initial encounter for fall yesterday with possible slurred speech and left arm weakness. EXAM: CT HEAD WITHOUT CONTRAST CT CERVICAL SPINE WITHOUT CONTRAST TECHNIQUE: Multidetector CT imaging of the head and  cervical spine was performed following the standard protocol without intravenous contrast. Multiplanar CT image reconstructions of the cervical spine were also generated. COMPARISON:  09/04/2013. FINDINGS: CT HEAD FINDINGS There is no evidence for acute hemorrhage, hydrocephalus, mass lesion, or abnormal extra-axial fluid collection. Patchy low attenuation in the  deep hemispheric and periventricular white matter is nonspecific, but likely reflects chronic microvascular ischemic demyelination. Chronic bilateral lacunar infarcts are seen in the basal ganglia. On image the 16, there is a new hypodensity in the left containment which has imaging features compatible with age indeterminate infarct although it does appear new since 09/04/2013. Diffuse loss of parenchymal volume is consistent with atrophy. The visualized paranasal sinuses and mastoid air cells are clear. No evidence for skull fracture. CT CERVICAL SPINE FINDINGS Imaging was obtained from the skullbase through the T1 vertebral body. There is no evidence for an acute fracture. Marked loss of disc height is seen at C2-3. There is loss of disc height with endplate degeneration at C6-7 and to a lesser degree at C5-6. Trace anterolisthesis of C4-5 is compatible with the degree of facet degeneration at this level. Similar trace anterolisthesis is seen at C5-6, stable. There is diffuse facet osteoarthritis throughout bilaterally. No prevertebral soft tissue swelling. IMPRESSION: Atrophy with chronic small vessel white matter ischemic disease. There is a new (since 09/04/2013) age indeterminate lacunar infarct in the left basal ganglia. No cervical spine fracture although diffuse degenerative changes are evident. Electronically Signed   By: Misty Stanley M.D.   On: 04/17/2014 14:31    Not all labs, radiology exams or other studies done during hospitalization come through on my EPIC note; however they are reviewed by me.    Assessment and Plan  No  problem-specific assessment & plan notes found for this encounter.   Hennie Duos, MD

## 2015-10-02 NOTE — Progress Notes (Signed)
MRN: [id number] Name: Cindy Trevino  Sex: female Age: 82 y.o. DOB: Mar 06, 1925  [place] #: [name] Facility/Room: Level Of Care: SNF Provider: [name] D Emergency Contacts: Extended Emergency Contact Information Primary Emergency Contact: Domingo Mend Address: [id number], [place] Phone: [phone] Relation: Daughter  Code Status:   Allergies: Review of patient's allergies indicates no known allergies.  Chief Complaint  Patient presents with  . Acute Visit    HPI: Patient is 82 y.o. female who  nursing asked me to see because she hit her head. She rolled over in bed and hit her head on the dresser next to the bed. No LOC, she did not fall out of bed,  she is acting her usual self  which is dementia with confusion.    Past Medical History  Diagnosis Date  . COPD (chronic obstructive pulmonary disease) ([place])   . History of breast cancer     L breast  . Arthritis   . Dementia   . Dementia   . Hypertension   . Hyperlipidemia     Past Surgical History  Procedure Laterality Date  . Breast lumpectomy      left breast      Medication List       This list is accurate as of: 09/28/15 11:59 PM.  Always use your most recent med list.               acetaminophen 325 MG tablet  Commonly known as:  TYLENOL  Take 650 mg by mouth every 6 (six) hours as needed.     atorvastatin 40 MG tablet  Commonly known as:  LIPITOR  Take 1 tablet (40 mg total) by mouth daily at 6 PM.     divalproex 125 MG capsule  Commonly known as:  DEPAKOTE SPRINKLE  Take 125 mg by mouth 2 (two) times daily.     ELIQUIS 2.5 MG Tabs tablet  Generic drug:  apixaban  Take 2.5 mg by mouth 2 (two) times daily.     feeding supplement (PRO-STAT SUGAR FREE 64) Liqd  Take 30 mLs by mouth daily.     ferrous sulfate 325 (65 FE) MG tablet  Take 325 mg by mouth daily with breakfast.     loperamide 2 MG capsule  Commonly known as:  IMODIUM   Give 1 capsule after each loose stool PRN. Not to exceed 8 mg in 24 hour period.     LORazepam 0.5 MG tablet  Commonly known as:  ATIVAN  Take 0.5 mg by mouth 2 (two) times daily.     memantine 10 MG tablet  Commonly known as:  NAMENDA  Take 10 mg by mouth 2 (two) times daily.     Menthol (Topical Analgesic) 4 % Gel  Apply topically. Apply to lt knee every 6hours prn for pain.     metoprolol tartrate 25 MG tablet  Commonly known as:  LOPRESSOR  Take 1 tablet (25 mg total) by mouth 3 (three) times daily.     PRESERVISION AREDS 2 PO  Take 1 tablet by mouth 2 (two) times daily.     traMADol 50 MG tablet  Commonly known as:  ULTRAM  Take 1 tablet (50 mg total) by mouth every 6 (six) hours as needed for moderate pain or severe pain.        No orders of the defined types were placed in this encounter.  Immunization History  Administered Date(s) Administered  . Influenza-Unspecified 03/22/2015  . PPD Test 05/08/2014    Social History  Substance Use Topics  . Smoking status: Never Smoker   . Smokeless tobacco: Not on file  . Alcohol Use: No    Review of Systems  UTO 2/2 dementia; per nursing as er HPI    Filed Vitals:   10/02/15 2207  BP: 116/74  Pulse: 81  Temp: 98.1 F (36.7 C)  Resp: 16    Physical Exam  GENERAL APPEARANCE: Alert, min conversant, No acute distress , does not appear in pain SKIN: No diaphoresis rash; no other body areas with acute bruising. HEENT: R eye lids upper and lower and surrounding soft tissue very blue with bruise, mod swelling, EOMI, conjunctiva are clear RESPIRATORY: Breathing is even, unlabored. Lung sounds are clear   CARDIOVASCULAR: Heart RRR no murmurs, rubs or gallops. No peripheral edema  GASTROINTESTINAL: Abdomen is soft, non-tender, not distended w/ normal bowel sounds.  GENITOURINARY: Bladder non tender, not distended  MUSCULOSKELETAL: No abnormal joints or musculature NEUROLOGIC: Cranial nerves 2-12 grossly intact.  Moves all extremities PSYCHIATRIC: dementia, no behavioral issues  Patient Active Problem List   Diagnosis Date Noted  . Diarrhea 09/10/2015  . Anxiety 07/04/2015  . Mood disorder ([place]) 07/04/2015  . DVT of lower extremity (deep venous thrombosis) ([place]) 01/23/2015  . OA (osteoarthritis) of knee 09/20/2014  . CKD (chronic kidney disease) stage 3, GFR 30-59 ml/min 08/04/2014  . Anemia of chronic disease 08/04/2014  . Hyperlipidemia   . Essential hypertension 04/20/2014  . Vascular dementia with behavior disturbance 04/20/2014  . Paroxysmal atrial fibrillation ([place]) 04/20/2014  . Basal ganglia infarction ([place]) 04/17/2014  . History of breast cancer 08/23/2012    CBC    Component Value Date/Time   WBC 8.5 09/15/2014   WBC 7.9 04/20/2014 0545   WBC 7.6 12/18/2010 1030   RBC 4.04 04/20/2014 0545   RBC 4.19 12/18/2010 1030   HGB 11.2* 09/15/2014   HGB 11.8 12/18/2010 1030   HCT 36 09/15/2014   HCT 35.4 12/18/2010 1030   PLT 192 09/15/2014   PLT 223 12/18/2010 1030   MCV 89.6 04/20/2014 0545   MCV 84.5 12/18/2010 1030   LYMPHSABS 1.5 04/17/2014 1313   LYMPHSABS 1.7 12/18/2010 1030   MONOABS 0.3 04/17/2014 1313   MONOABS 0.4 12/18/2010 1030   EOSABS 0.0 04/17/2014 1313   EOSABS 0.1 12/18/2010 1030   BASOSABS 0.0 04/17/2014 1313   BASOSABS 0.0 12/18/2010 1030    CMP     Component Value Date/Time   NA 140 09/15/2014   NA 139 04/20/2014 0545   K 4.0 09/15/2014   CL 103 04/20/2014 0545   CO2 23 04/20/2014 0545   GLUCOSE 131* 04/20/2014 0545   BUN 23* 09/15/2014   BUN 27* 04/20/2014 0545   CREATININE 1.15* 04/20/2014 0545   CALCIUM 8.8 04/20/2014 0545   PROT 6.6 09/04/2013 2358   ALBUMIN 3.4* 09/04/2013 2358   AST 16 08/10/2015   ALT 11 08/10/2015   ALKPHOS 55 08/10/2015   BILITOT 0.5 09/04/2013 2358   GFRNONAA 42* 04/20/2014 0545   GFRAA 48* 04/20/2014 0545    Assessment and Plan  CONTUSION R EYE- will do neuro checks although pt appears fine; bruising will  be worse in the morning.Marland Kitchen  Hennie Duos, MD

## 2015-10-11 ENCOUNTER — Encounter: Payer: Self-pay | Admitting: Internal Medicine

## 2015-10-11 ENCOUNTER — Non-Acute Institutional Stay (SKILLED_NURSING_FACILITY): Payer: Medicare Other | Admitting: Internal Medicine

## 2015-10-11 DIAGNOSIS — N183 Chronic kidney disease, stage 3 unspecified: Secondary | ICD-10-CM

## 2015-10-11 DIAGNOSIS — I480 Paroxysmal atrial fibrillation: Secondary | ICD-10-CM

## 2015-10-11 DIAGNOSIS — F0151 Vascular dementia with behavioral disturbance: Secondary | ICD-10-CM | POA: Diagnosis not present

## 2015-10-11 DIAGNOSIS — R634 Abnormal weight loss: Secondary | ICD-10-CM | POA: Diagnosis not present

## 2015-10-11 DIAGNOSIS — R630 Anorexia: Secondary | ICD-10-CM | POA: Diagnosis not present

## 2015-10-11 DIAGNOSIS — F01518 Vascular dementia, unspecified severity, with other behavioral disturbance: Secondary | ICD-10-CM

## 2015-10-11 NOTE — Progress Notes (Signed)
Patient ID: Cindy Trevino, female   DOB: 1925/04/17, 82 y.o.   MRN: [id number]  MRN: [id number] Name: Cindy Trevino  Sex: female Age: 82 y.o. DOB: [birth year]  [place] #: [name] Facility/Room: Level Of Care: SNF Provider: Wille Celeste Emergency Contacts: Extended Emergency Contact Information Primary Emergency Contact: Cindy Trevino Address: [id number], [place] Phone: [phone] Relation: Daughter  Code Status:   Allergies: Review of patient's allergies indicates no known allergies.  Chief Complaint  Patient presents with  . Acute Visit   acute visit Secondary to weight loss.  Medical management of chronic medical issues including dementia-atrial fibrillation-anemia-  HPI: Patient is 82 y.o. female with dementia who is being seen for routine issues of DVT, A Fib and s/p CVA.--Anemia dementia-acute visit secondary to poor appetite with weight loss  Per discussion with nursing patient has lost about 12 pounds the past month-she does have significant dementia which is probably progressing.  Remeron has just been started-I did speak with dietitian as well and  will be adding supplements  She also has a contusion to her right eye sustained after a recent fall this appears to be slowly healing were no other residual effects.  In regards to her medical issues otherwise     She does have a history of DVT as well as atrial fibrillation continues on Eliquis.--She is on Lopressor for rate control and this appears to be stable.  In regards to dementia she is on Namenda this appears again to be somewhat progressing which may be adding to her poor by mouth intake.  She is on Depakote for behaviors and these appear to be stable she has Ativan as needed as well as for occasional breakthrough anxiety.  Patient is a poor historian  and cannot really give any review of systems her vital signs however appear to be stable she is not  complaining of any abdominal pain or nausea or vomiting noted  .    Past Medical History  Diagnosis Date  . COPD (chronic obstructive pulmonary disease) ([place])   . History of breast cancer     L breast  . Arthritis   . Dementia   . Dementia   . Hypertension   . Hyperlipidemia     Past Surgical History  Procedure Laterality Date  . Breast lumpectomy      left breast      Medication List       This list is accurate as of: 10/11/15 11:59 PM.  Always use your most recent med list.               acetaminophen 325 MG tablet  Commonly known as:  TYLENOL  Take 650 mg by mouth every 6 (six) hours as needed.     atorvastatin 40 MG tablet  Commonly known as:  LIPITOR  Take 1 tablet (40 mg total) by mouth daily at 6 PM.     divalproex 125 MG capsule  Commonly known as:  DEPAKOTE SPRINKLE  Take 125 mg by mouth 2 (two) times daily.     ELIQUIS 2.5 MG Tabs tablet  Generic drug:  apixaban  Take 2.5 mg by mouth 2 (two) times daily.     feeding supplement (PRO-STAT SUGAR FREE 64) Liqd  Take 30 mLs by mouth daily.     ferrous sulfate 325 (65 FE) MG tablet  Take 325 mg by mouth daily with breakfast.  loperamide 2 MG capsule  Commonly known as:  IMODIUM  Give 1 capsule after each loose stool PRN. Not to exceed 8 mg in 24 hour period.     LORazepam 0.5 MG tablet  Commonly known as:  ATIVAN  Take 0.5 mg by mouth 2 (two) times daily.     memantine 10 MG tablet  Commonly known as:  NAMENDA  Take 10 mg by mouth 2 (two) times daily.     Menthol (Topical Analgesic) 4 % Gel  Apply topically. Apply to lt knee every 6hours prn for pain.     metoprolol tartrate 25 MG tablet  Commonly known as:  LOPRESSOR  Take 1 tablet (25 mg total) by mouth 3 (three) times daily.     PRESERVISION AREDS 2 PO  Take 1 tablet by mouth 2 (two) times daily.     traMADol 50 MG tablet  Commonly known as:  ULTRAM  Take 1 tablet (50 mg total) by mouth every 6 (six) hours as needed for moderate  pain or severe pain.          Immunization History  Administered Date(s) Administered  . Influenza-Unspecified 03/22/2015  . PPD Test 05/08/2014    Social History  Substance Use Topics  . Smoking status: Never Smoker   . Smokeless tobacco: Not on file  . Alcohol Use: No    Review of Systems UTO 2/2 dementia; Please see history of present illness   Filed Vitals:   10/11/15 1911  BP: 120/70  Pulse: 78  Temp: 97.1 F (36.2 C)  Resp: 18    Physical Exam  GENERAL APPEARANCE: Alert, min conversant, pleasant WF in [facility] acute distress  SKIN: No diaphoresis rashHas a healing contusion right orbital area this is violaceous brawny appearing HEENT: Unremarkable oropharynx clear mucous membranes moist RESPIRATORY: Breathing is even, unlabored. Lung sounds are clear   CARDIOVASCULAR: Heart RRR no murmurs, rubs or gallops. Baseline   peripheral edema  GASTROINTESTINAL: Abdomen is soft, non-tender, not distended w/ normal bowel sounds.  GENITOURINARY: Bladder non tender, not distended  MUSCULOSKELETAL: No abnormal joints or musculature moves all extremities 4-ambulates in a wheelchair NEUROLOGIC: Cranial nerves 2-12 grossly intact. Moves all extremities PSYCHIATRIC: dementia, no behavioral issues pleasant smiling  Patient Active Problem List   Diagnosis Date Noted  . Poor appetite 10/11/2015  . Loss of weight 10/11/2015  . Diarrhea 09/10/2015  . Anxiety 07/04/2015  . Mood disorder ([place]) 07/04/2015  . DVT of lower extremity (deep venous thrombosis) ([place]) 01/23/2015  . OA (osteoarthritis) of knee 09/20/2014  . CKD (chronic kidney disease) stage 3, GFR 30-59 ml/min 08/04/2014  . Anemia of chronic disease 08/04/2014  . Hyperlipidemia   . Essential hypertension 04/20/2014  . Vascular dementia with behavior disturbance 04/20/2014  . Paroxysmal atrial fibrillation ([place]) 04/20/2014  . Basal ganglia infarction ([place]) 04/17/2014  . History of breast cancer 08/23/2012      Labs.  09/12/2015.  WBC 7.3 hemoglobin 11.4 platelets 226.  Sodium 139 potassium 4.5 BUN 28 creatinine 1.03.  Albumin 2.7-otherwise liver function tests within normal limits  08/16/2015.  Albumin 3.2 otherwise liver function tests within normal limits.  03/19/2015.  Sodium 135 potassium 4.2 BUN 17 creatinine 1.8.  TSH-2.25.  WBC 6.6 hemoglobin 9.2 platelets 159 CBC    Component Value Date/Time   WBC 8.5 09/15/2014   WBC 7.9 04/20/2014 0545   WBC 7.6 12/18/2010 1030   RBC 4.04 04/20/2014 0545   RBC 4.19 12/18/2010 1030   HGB 11.2* 09/15/2014  HGB 11.8 12/18/2010 1030   HCT 36 09/15/2014   HCT 35.4 12/18/2010 1030   PLT 192 09/15/2014   PLT 223 12/18/2010 1030   MCV 89.6 04/20/2014 0545   MCV 84.5 12/18/2010 1030   LYMPHSABS 1.5 04/17/2014 1313   LYMPHSABS 1.7 12/18/2010 1030   MONOABS 0.3 04/17/2014 1313   MONOABS 0.4 12/18/2010 1030   EOSABS 0.0 04/17/2014 1313   EOSABS 0.1 12/18/2010 1030   BASOSABS 0.0 04/17/2014 1313   BASOSABS 0.0 12/18/2010 1030    CMP     Component Value Date/Time   NA 140 09/15/2014   NA 139 04/20/2014 0545   K 4.0 09/15/2014   CL 103 04/20/2014 0545   CO2 23 04/20/2014 0545   GLUCOSE 131* 04/20/2014 0545   BUN 23* 09/15/2014   BUN 27* 04/20/2014 0545   CREATININE 1.15* 04/20/2014 0545   CALCIUM 8.8 04/20/2014 0545   PROT 6.6 09/04/2013 2358   ALBUMIN 3.4* 09/04/2013 2358   AST 16 08/10/2015   ALT 11 08/10/2015   ALKPHOS 55 08/10/2015   BILITOT 0.5 09/04/2013 2358   GFRNONAA 42* 04/20/2014 0545   GFRAA 48* 04/20/2014 0545    Assessment and Plan  History of poor by mouth intake-she has been started on Remeron I also select with dietitian who will add to her supplements currently she is on Protostat-.  Also will check a TSH  I also discussed  this with her responsible party her grandson via phone-I did relate that this could be dementia related --possibly a natural progression of the disease with lack of  appetite-he did express understanding again will see what the Remeron does a did explain that Remeron is not a magic bullet-and he did express understanding.  We will also check her TSH  Also will await dietary input on this as noted above.  In regards to dementia this appears to be relatively stable otherwise with supportive care she continues on Namenda is on Depakote for behaviors-Ativan for situational anxiety    history of atrial fibrillation this is rate controlled on Lopressor she is on Eliquis for anticoagulation.  History of DVT-apparently this was back in July 2016-this has been assessed by Dr. Sheppard Coil she again does continue on Eliquis for prophylaxis with her history of A. fib and CVA.  History of basal ganglia infarction-initially treated on Xarelto now is on Eliquis.  She appears to have had minimal residual effect here--she continues on a statin as well  Renal insufficiency apparently this has improved with a creatinine 1.03 on lab done last month will update this previous baseline at appears have been more in the higher  Ones--will update this  .  History of anemia she is on iron this appears improved actually at 11.4 on lab done on March 15-will update this   History of right eye contusion-continues to have some bruising although this appears to be resolving I do not really note any acute changes on exam   .  Pain management she continues on tramadol 50 mg every 6 hours when necessary apparently this is effective.  [id number] note greater than 35 minutes spent assessing patient-discussing her status with nursing staff as well as with her responsible party via phone reviewing her chart-and coordinating formulating a plan of care for numerous diagnoses-note  greater than 50% of time spent coordinating plan of care    ,

## 2015-10-12 LAB — CBC AND DIFFERENTIAL
HEMATOCRIT: 38 % (ref 36–46)
HEMOGLOBIN: 12.4 g/dL (ref 12.0–16.0)
Platelets: 252 10*3/uL (ref 150–399)
WBC: 7.3 10^3/mL

## 2015-10-12 LAB — TSH: TSH: 1.05 u[IU]/mL (ref <=5.90)

## 2015-10-18 LAB — HEPATIC FUNCTION PANEL
ALK PHOS: 49 U/L (ref 25–125)
ALT: 5 U/L — AB (ref 7–35)
AST: 10 U/L — AB (ref 13–35)
BILIRUBIN, TOTAL: 0.6 mg/dL

## 2015-10-22 ENCOUNTER — Encounter: Payer: Self-pay | Admitting: Internal Medicine

## 2015-10-22 ENCOUNTER — Non-Acute Institutional Stay (SKILLED_NURSING_FACILITY): Payer: Medicare Other | Admitting: Internal Medicine

## 2015-10-22 DIAGNOSIS — N289 Disorder of kidney and ureter, unspecified: Secondary | ICD-10-CM

## 2015-10-22 DIAGNOSIS — F03918 Unspecified dementia, unspecified severity, with other behavioral disturbance: Secondary | ICD-10-CM

## 2015-10-22 DIAGNOSIS — F0391 Unspecified dementia with behavioral disturbance: Secondary | ICD-10-CM

## 2015-10-22 NOTE — Progress Notes (Signed)
Patient ID: Cindy Trevino, female   DOB: 26-Jan-1925, 82 y.o.   MRN: [id number]   MRN: [id number] Name: Cindy Trevino  Sex: female Age: 82 y.o. DOB: 1925-02-18  [place] [facility]: [name] Facility/Room: Level Of Care: SNF Provider: Oralia Manis Emergency Contacts: Extended Emergency Contact Information Primary Emergency Contact: Domingo Mend Address: [id number] [place], [place] Phone: [phone] Relation: Daughter  Code Status:   Allergies: Review of patient's allergies indicates no known allergies.  Chief Complaint  Patient presents with  . Acute Visit  Secondary to renal insufficiency   HPI: Patient is 82 y.o. female with dementia who is being seen for renal insufficiency.  Patient's baseline creatinine appears to be about 1 on lab done on April 20 had risen up to 1.42 with a BUN of 37.  Patient has had some weight loss about 12 pounds over a period of weeks-she does have significant dementia which I suspect is contributing to this and I have spoken to her family about this as well -- have expressed understanding.  We've has started Remeron recently-dietary also is adjusting her supplements.  Patient apparently has somewhat spotty fluid intake this will have to be encouraged strongly clinically she appears to be stable her vital signs are stable she has no complaints she is resting in bed comfortably today continues to be pleasantly confused.          .    Past Medical History  Diagnosis Date  . COPD (chronic obstructive pulmonary disease) ([place])   . History of breast cancer     L breast  . Arthritis   . Dementia   . Dementia   . Hypertension   . Hyperlipidemia     Past Surgical History  Procedure Laterality Date  . Breast lumpectomy      left breast      Medication List       This list is accurate as of: 10/22/15  3:34 PM.  Always use your most recent med list.               acetaminophen 325 MG  tablet  Commonly known as:  TYLENOL  Take 650 mg by mouth every 6 (six) hours as needed.     atorvastatin 40 MG tablet  Commonly known as:  LIPITOR  Take 1 tablet (40 mg total) by mouth daily at 6 PM.     divalproex 125 MG capsule  Commonly known as:  DEPAKOTE SPRINKLE  Take 125 mg by mouth 2 (two) times daily.     ELIQUIS 2.5 MG Tabs tablet  Generic drug:  apixaban  Take 2.5 mg by mouth 2 (two) times daily.     feeding supplement (PRO-STAT SUGAR FREE 64) Liqd  Take 30 mLs by mouth daily.     ferrous sulfate 325 (65 FE) MG tablet  Take 325 mg by mouth daily with breakfast.     loperamide 2 MG capsule  Commonly known as:  IMODIUM  Give 1 capsule after each loose stool PRN. Not to exceed 8 mg in 24 hour period.     LORazepam 0.5 MG tablet  Commonly known as:  ATIVAN  Take 1 tablet by mouth at noon for anxiety     memantine 10 MG tablet  Commonly known as:  NAMENDA  Take 10 mg by mouth 2 (two) times daily.     Menthol (Topical Analgesic) 4 % Gel  Apply topically. Apply to lt knee every 6hours prn for pain.     metoprolol tartrate 25 MG tablet  Commonly known as:  LOPRESSOR  Take 1 tablet (25 mg total) by mouth 3 (three) times daily.     mirtazapine 15 MG tablet  Commonly known as:  REMERON  Take 1/2 tablet ( 7.5 mg ) by mouth at bedtime     PRESERVISION AREDS 2 PO  Take 1 tablet by mouth 2 (two) times daily.     promethazine 25 MG suppository  Commonly known as:  PHENERGAN  If unable to give suppository every 6 hours PRN x 3 doses notify MD if symptoms persist fofr more than 24 hours     Sennosides 8.6 MG Caps  Take 1 tablet by mouth daily     traMADol 50 MG tablet  Commonly known as:  ULTRAM  Take 1 tablet (50 mg total) by mouth every 6 (six) hours as needed for moderate pain or severe pain.          Immunization History  Administered Date(s) Administered  . Influenza-Unspecified 03/22/2015  . PPD Test 05/08/2014    Social History  Substance Use  Topics  . Smoking status: Never Smoker   . Smokeless tobacco: Not on file  . Alcohol Use: No    Review of Systems UTO 2/2 dementia; Please see history of present illness   Filed Vitals:   10/22/15 1504  BP: 122/83  Pulse: 90  Temp: 96.9 F (36.1 C)  Resp: 16    Physical Exam  GENERAL APPEARANCE: Alert, min conversant, pleasant WF in [facility] acute distress  SKIN: No diaphoresis rashHas a healing contusion right orbital area this is violaceous brawny appearing--this is receding--skin turgor appears fairly well preserved HEENT: Unremarkable oropharynx clear mucous membranes moist RESPIRATORY: Breathing is even, unlabored. Lung sounds are clear   CARDIOVASCULAR: Heart RRR no murmurs, rubs or gallops. Baseline   peripheral edema  GASTROINTESTINAL: Abdomen is soft, non-tender, not distended w/ normal bowel sounds.  GENITOURINARY: Bladder non tender, not distended  MUSCULOSKELETAL: No abnormal joints or musculature moves all extremities 4-ambulates in a wheelchair NEUROLOGIC: Cranial nerves 2-12 grossly intact. Moves all extremities PSYCHIATRIC: dementia, no behavioral issues pleasant smiling oriented to self  Patient Active Problem List   Diagnosis Date Noted  . Poor appetite 10/11/2015  . Loss of weight 10/11/2015  . Diarrhea 09/10/2015  . Anxiety 07/04/2015  . Mood disorder ([place]) 07/04/2015  . DVT of lower extremity (deep venous thrombosis) ([place]) 01/23/2015  . OA (osteoarthritis) of knee 09/20/2014  . CKD (chronic kidney disease) stage 3, GFR 30-59 ml/min 08/04/2014  . Anemia of chronic disease 08/04/2014  . Hyperlipidemia   . Essential hypertension 04/20/2014  . Vascular dementia with behavior disturbance 04/20/2014  . Paroxysmal atrial fibrillation ([place]) 04/20/2014  . Basal ganglia infarction ([place]) 04/17/2014  . History of breast cancer 08/23/2012     Labs.  10/18/2015.  Sodium 143 potassium 4.4 BUN 37 creatinine 1.42.  10/12/2015.  CBC 7.3 hemoglobin 12.4  platelets 252.  Of note albumin was 3.0  09/12/2015.  WBC 7.3 hemoglobin 11.4 platelets 226.  Sodium 139 potassium 4.5 BUN 28 creatinine 1.03.  Albumin 2.7-otherwise liver function tests within normal limits  08/16/2015.  Albumin 3.2 otherwise liver function tests within normal limits.  03/19/2015.  Sodium 135 potassium 4.2 BUN 17 creatinine 1.8.  TSH-2.25.  WBC 6.6 hemoglobin 9.2 platelets 159 CBC    Component Value Date/Time   WBC 8.5 09/15/2014   WBC  7.9 04/20/2014 0545   WBC 7.6 12/18/2010 1030   RBC 4.04 04/20/2014 0545   RBC 4.19 12/18/2010 1030   HGB 11.2* 09/15/2014   HGB 11.8 12/18/2010 1030   HCT 36 09/15/2014   HCT 35.4 12/18/2010 1030   PLT 192 09/15/2014   PLT 223 12/18/2010 1030   MCV 89.6 04/20/2014 0545   MCV 84.5 12/18/2010 1030   LYMPHSABS 1.5 04/17/2014 1313   LYMPHSABS 1.7 12/18/2010 1030   MONOABS 0.3 04/17/2014 1313   MONOABS 0.4 12/18/2010 1030   EOSABS 0.0 04/17/2014 1313   EOSABS 0.1 12/18/2010 1030   BASOSABS 0.0 04/17/2014 1313   BASOSABS 0.0 12/18/2010 1030    CMP     Component Value Date/Time   NA 140 09/15/2014   NA 139 04/20/2014 0545   K 4.0 09/15/2014   CL 103 04/20/2014 0545   CO2 23 04/20/2014 0545   GLUCOSE 131* 04/20/2014 0545   BUN 23* 09/15/2014   BUN 27* 04/20/2014 0545   CREATININE 1.15* 04/20/2014 0545   CALCIUM 8.8 04/20/2014 0545   PROT 6.6 09/04/2013 2358   ALBUMIN 3.4* 09/04/2013 2358   AST 16 08/10/2015   ALT 11 08/10/2015   ALKPHOS 55 08/10/2015   BILITOT 0.5 09/04/2013 2358   GFRNONAA 42* 04/20/2014 0545   GFRAA 48* 04/20/2014 0545    Assessment and Plan   Renal insufficiency appears to be a new issue---this may become more of an issue as patient's dementia progresses and I suspect her appetite will be a challenge-nonetheless she appears stable at this point at this point will write an order to push fluids aggressively I have spoken with nursing staff about this as well.  Patient would be a  poor candidate for an IV secondary to agitation and staff is convinced she would pull this out.  Again fluids will have to be encouraged and we will update a lab this week to see where we stand.  Dementia-again she continues on Namenda she is on Depakote for behavior stabilization also Ativan as needed for breakthrough agitation she appears to be stable in this regard to get I suspect her progressing dementia is contributing somewhat to her renal insufficiency and poor by mouth intake.  She has just been started on Remeron would like to give this more time before judging its effectiveness  [id number]          ,

## 2015-10-24 LAB — BASIC METABOLIC PANEL
BUN: 36 mg/dL — AB (ref 4–21)
Creatinine: 1.5 mg/dL — AB (ref 0.5–1.1)
Glucose: 90 mg/dL
POTASSIUM: 4.7 mmol/L (ref 3.4–5.3)
SODIUM: 143 mmol/L (ref 137–147)

## 2015-10-28 NOTE — Progress Notes (Signed)
Patient ID: Cindy Trevino, female   DOB: 1924-11-27, 82 y.o.   MRN: [id number]

## 2015-10-30 LAB — BASIC METABOLIC PANEL
BUN: 33 mg/dL — AB (ref 4–21)
Creatinine: 1.1 mg/dL (ref <=1.1)
Glucose: 78 mg/dL
Potassium: 4.4 mmol/L (ref 3.4–5.3)
SODIUM: 142 mmol/L (ref 137–147)

## 2015-11-14 ENCOUNTER — Non-Acute Institutional Stay (SKILLED_NURSING_FACILITY): Payer: Medicare Other | Admitting: Internal Medicine

## 2015-11-14 ENCOUNTER — Encounter: Payer: Self-pay | Admitting: Internal Medicine

## 2015-11-14 DIAGNOSIS — I10 Essential (primary) hypertension: Secondary | ICD-10-CM

## 2015-11-14 DIAGNOSIS — F39 Unspecified mood [affective] disorder: Secondary | ICD-10-CM | POA: Diagnosis not present

## 2015-11-14 DIAGNOSIS — F01518 Vascular dementia, unspecified severity, with other behavioral disturbance: Secondary | ICD-10-CM

## 2015-11-14 DIAGNOSIS — F0151 Vascular dementia with behavioral disturbance: Secondary | ICD-10-CM

## 2015-11-14 NOTE — Progress Notes (Signed)
MRN: [id number] Name: Cindy Trevino  Sex: female Age: 82 y.o. DOB: 10-20-1924  [place] #: [name] Facility/Room:203- D Level Of Care: SNF Provider: [name] MD Emergency Contacts: Extended Emergency Contact Information Primary Emergency Contact: Domingo Mend Address: [id number], [place] Phone: [phone] Relation: Daughter  Code Status: DNR  Allergies: Review of patient's allergies indicates no known allergies.  Chief Complaint  Patient presents with  . Medical Management of Chronic Issues    HPI: Patient is 82 y.o. female who is being seen for routine issues of HTN, dementia and mood disorder.  Past Medical History  Diagnosis Date  . COPD (chronic obstructive pulmonary disease) ([place])   . History of breast cancer     L breast  . Arthritis   . Dementia   . Dementia   . Hypertension   . Hyperlipidemia     Past Surgical History  Procedure Laterality Date  . Breast lumpectomy      left breast      Medication List       This list is accurate as of: 11/14/15 11:59 PM.  Always use your most recent med list.               acetaminophen 325 MG tablet  Commonly known as:  TYLENOL  Take 650 mg by mouth every 6 (six) hours as needed.     atorvastatin 40 MG tablet  Commonly known as:  LIPITOR  Take 1 tablet (40 mg total) by mouth daily at 6 PM.     divalproex 125 MG capsule  Commonly known as:  DEPAKOTE SPRINKLE  Take 125 mg by mouth at bedtime.     ELIQUIS 2.5 MG Tabs tablet  Generic drug:  apixaban  Take 2.5 mg by mouth 2 (two) times daily.     feeding supplement (PRO-STAT SUGAR FREE 64) Liqd  Take 30 mLs by mouth daily.     ferrous sulfate 325 (65 FE) MG tablet  Take 325 mg by mouth daily with breakfast.     loperamide 2 MG capsule  Commonly known as:  IMODIUM  Give 1 capsule after each loose stool PRN. Not to exceed 8 mg in 24 hour period.     LORazepam 0.5 MG tablet  Commonly known as:   ATIVAN  Take 1 tablet by mouth at noon for anxiety     memantine 10 MG tablet  Commonly known as:  NAMENDA  Take 10 mg by mouth 2 (two) times daily.     Menthol (Topical Analgesic) 4 % Gel  Apply topically. Apply to lt knee every 6hours prn for pain.     metoprolol tartrate 25 MG tablet  Commonly known as:  LOPRESSOR  Take 1 tablet (25 mg total) by mouth 3 (three) times daily.     mirtazapine 15 MG tablet  Commonly known as:  REMERON  Take 1/2 tablet ( 7.5 mg ) by mouth at bedtime     PRESERVISION AREDS 2 PO  Take 1 tablet by mouth 2 (two) times daily.     promethazine 25 MG suppository  Commonly known as:  PHENERGAN  If unable to give suppository every 6 hours PRN x 3 doses notify MD if symptoms persist fofr more than 24 hours     Sennosides 8.6 MG Caps  Take 1 tablet by mouth daily     traMADol 50 MG tablet  Commonly known as:  ULTRAM  Take 1 tablet (50 mg total) by mouth every 6 (six) hours as needed for moderate pain or severe pain.        No orders of the defined types were placed in this encounter.    Immunization History  Administered Date(s) Administered  . Influenza-Unspecified 03/22/2015  . PPD Test 05/08/2014    Social History  Substance Use Topics  . Smoking status: Never Smoker   . Smokeless tobacco: Not on file  . Alcohol Use: No    Review of Systems  UTO 2/2 dementia    Filed Vitals:   11/14/15 1248  BP: 116/74  Pulse: 81  Temp: 98.1 F (36.7 C)  Resp: 16    Physical Exam  GENERAL APPEARANCE: Alert, non conversant, No acute distress; WF sits in WC in hall daily  SKIN: No diaphoresis rash HEENT: Unremarkable RESPIRATORY: Breathing is even, unlabored. Lung sounds are clear   CARDIOVASCULAR: Heart RRR no murmurs, rubs or gallops. No peripheral edema  GASTROINTESTINAL: Abdomen is soft, non-tender, not distended w/ normal bowel sounds.  GENITOURINARY: Bladder non tender, not distended  MUSCULOSKELETAL: No abnormal joints or  musculature NEUROLOGIC: Cranial nerves 2-12 grossly intact. Moves all extremities PSYCHIATRIC: dementia, no behavioral issues  Patient Active Problem List   Diagnosis Date Noted  . Poor appetite 10/11/2015  . Loss of weight 10/11/2015  . Diarrhea 09/10/2015  . Anxiety 07/04/2015  . Mood disorder ([place]) 07/04/2015  . DVT of lower extremity (deep venous thrombosis) ([place]) 01/23/2015  . OA (osteoarthritis) of knee 09/20/2014  . CKD (chronic kidney disease) stage 3, GFR 30-59 ml/min 08/04/2014  . Anemia of chronic disease 08/04/2014  . Hyperlipidemia   . Essential hypertension 04/20/2014  . Vascular dementia with behavior disturbance 04/20/2014  . Paroxysmal atrial fibrillation ([place]) 04/20/2014  . Basal ganglia infarction ([place]) 04/17/2014  . History of breast cancer 08/23/2012    CBC    Component Value Date/Time   WBC 7.3 10/12/2015   WBC 7.9 04/20/2014 0545   WBC 7.6 12/18/2010 1030   RBC 4.04 04/20/2014 0545   RBC 4.19 12/18/2010 1030   HGB 12.4 10/12/2015   HGB 11.8 12/18/2010 1030   HCT 38 10/12/2015   HCT 35.4 12/18/2010 1030   PLT 252 10/12/2015   PLT 223 12/18/2010 1030   MCV 89.6 04/20/2014 0545   MCV 84.5 12/18/2010 1030   LYMPHSABS 1.5 04/17/2014 1313   LYMPHSABS 1.7 12/18/2010 1030   MONOABS 0.3 04/17/2014 1313   MONOABS 0.4 12/18/2010 1030   EOSABS 0.0 04/17/2014 1313   EOSABS 0.1 12/18/2010 1030   BASOSABS 0.0 04/17/2014 1313   BASOSABS 0.0 12/18/2010 1030    CMP     Component Value Date/Time   NA 142 10/30/2015   NA 139 04/20/2014 0545   K 4.4 10/30/2015   CL 103 04/20/2014 0545   CO2 23 04/20/2014 0545   GLUCOSE 131* 04/20/2014 0545   BUN 33* 10/30/2015   BUN 27* 04/20/2014 0545   CREATININE 1.1 10/30/2015   CREATININE 1.15* 04/20/2014 0545   CALCIUM 8.8 04/20/2014 0545   PROT 6.6 09/04/2013 2358   ALBUMIN 3.4* 09/04/2013 2358   AST 10* 10/18/2015   ALT 5* 10/18/2015   ALKPHOS 49 10/18/2015   BILITOT 0.5 09/04/2013 2358   GFRNONAA 42*  04/20/2014 0545   GFRAA 48* 04/20/2014 0545    Assessment and Plan  Essential hypertension Chronic and stable and controlled on metoprolol 25 mg BID; plan - cont current meds  Vascular dementia with  behavior disturbance Chronic and stable without major declines; cont namenda BID  Mood disorder (HCC) Depakote has been decreased to 125 mg qHS and pt remains stable; will monitor    [name] MD

## 2015-11-16 ENCOUNTER — Encounter: Payer: Self-pay | Admitting: Internal Medicine

## 2015-11-16 NOTE — Assessment & Plan Note (Signed)
Chronic and stable and controlled on metoprolol 25 mg BID; plan - cont current meds

## 2015-11-16 NOTE — Assessment & Plan Note (Signed)
Chronic and stable without major declines; cont namenda BID

## 2015-11-16 NOTE — Assessment & Plan Note (Signed)
Depakote has been decreased to 125 mg qHS and pt remains stable; will monitor

## 2015-11-23 IMAGING — CT CT HEAD W/O CM
4 of 5 series · 17 of 47 positions shown, 18 images · non-contrast
Comparison: 09/04/2013.

CLINICAL DATA: Initial encounter for fall yesterday with possible
slurred speech and left arm weakness.

EXAM:
CT HEAD WITHOUT CONTRAST
CT CERVICAL SPINE WITHOUT CONTRAST
TECHNIQUE: Multidetector CT imaging of the head and cervical spine was
performed following the standard protocol without intravenous
contrast. Multiplanar CT image reconstructions of the cervical spine
were also generated.

[Series 2: coronals · coronal · 0.32mm/px · 3 of 48 slices shown]
[im 16/48  brain]
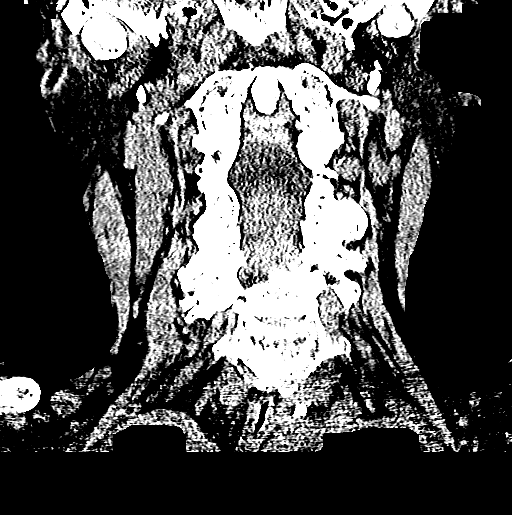
[im 21/48  brain]
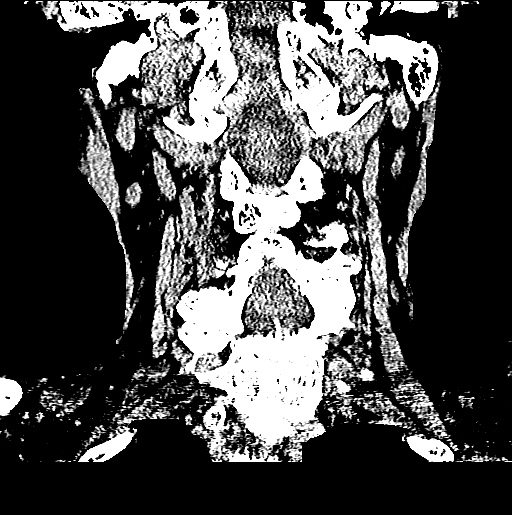
[im 27/48  brain]
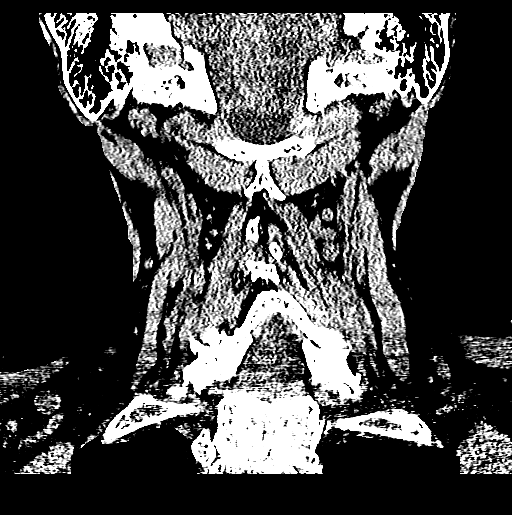

[Series 3: head 5.0 h30s · axial · 0.46mm/px · z∈[+1100,+1180]mm · 3 of 34 slices shown, 4 images]
[im 9/34  brain]
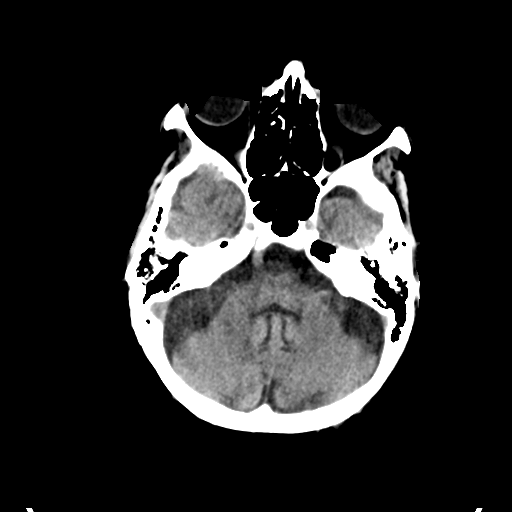
[im 9/34  bone]
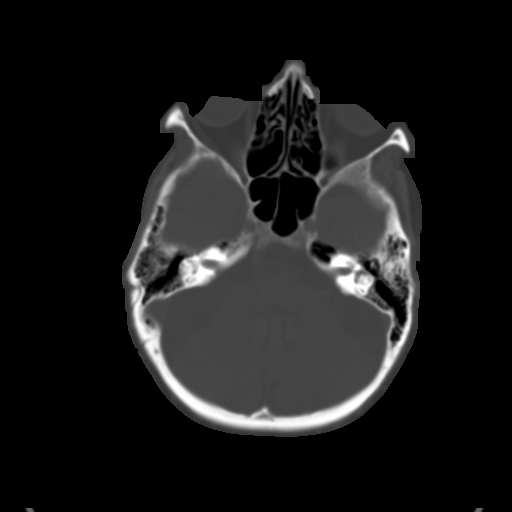
[im 17/34  brain]
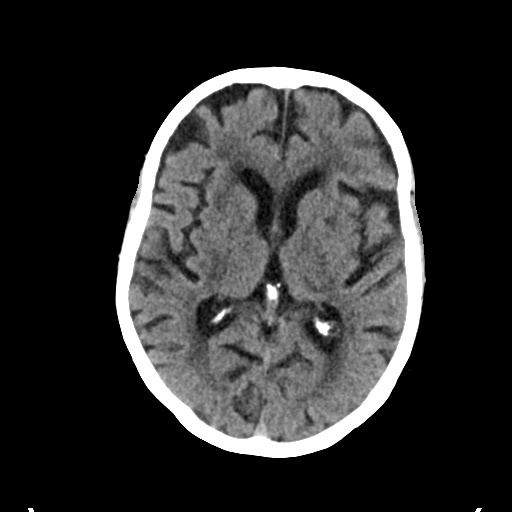
[im 25/34  brain]
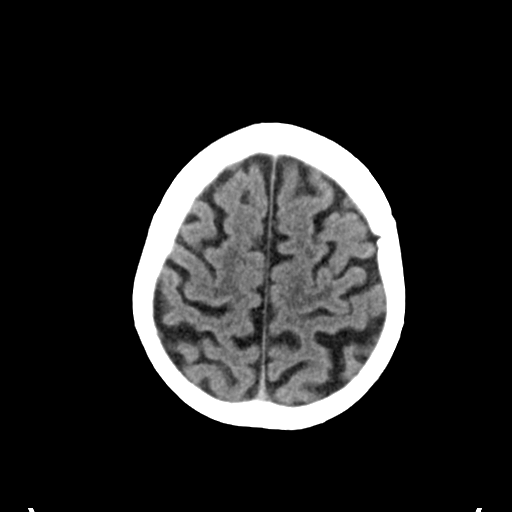

[Series 8: sagittals · sagittal · 0.30mm/px · 3 of 41 slices shown]
[im 14/41  brain]
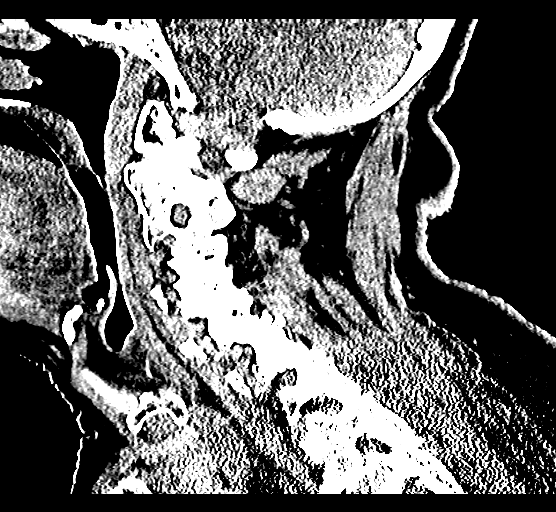
[im 21/41  brain]
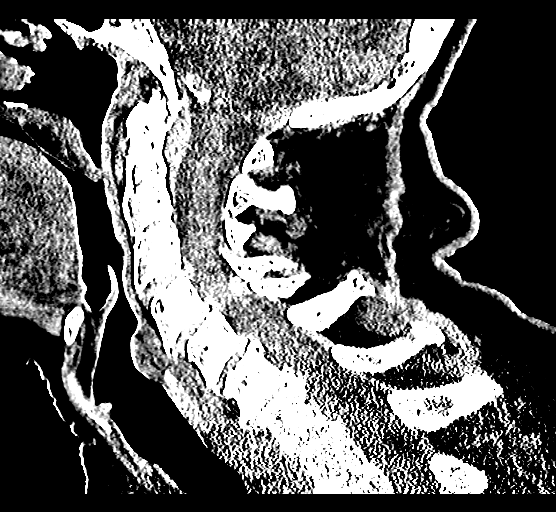
[im 27/41  brain]
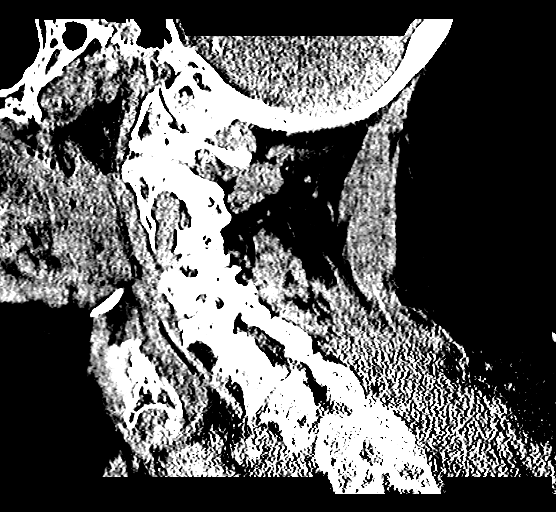

[Series 9: orthogonals · axial · 0.32mm/px · z∈[+917,+1031]mm · 8 of 76 slices shown]
[im 7/76  brain]
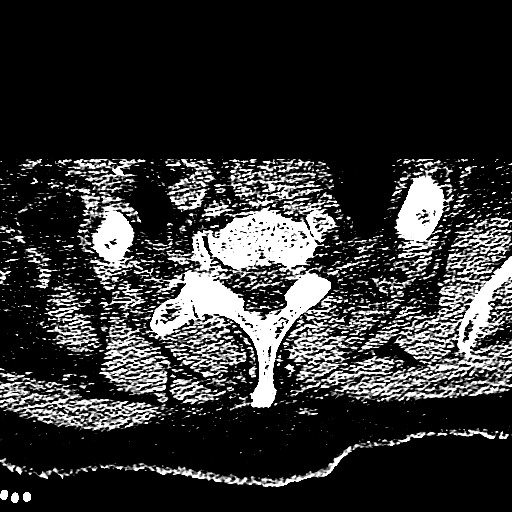
[im 19/76  brain]
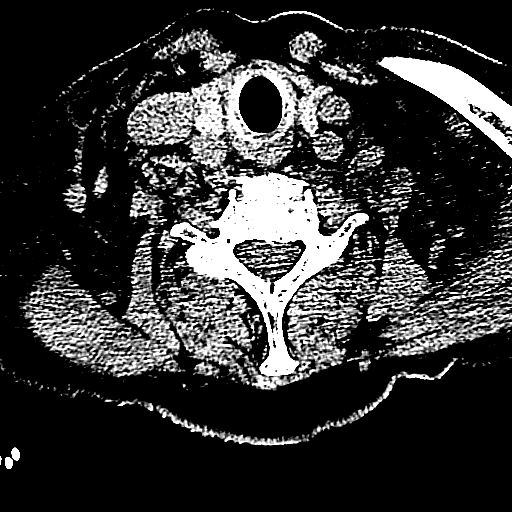
[im 26/76  brain]
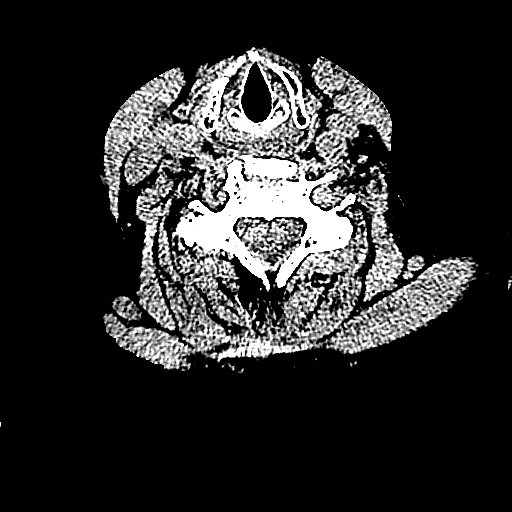
[im 32/76  brain]
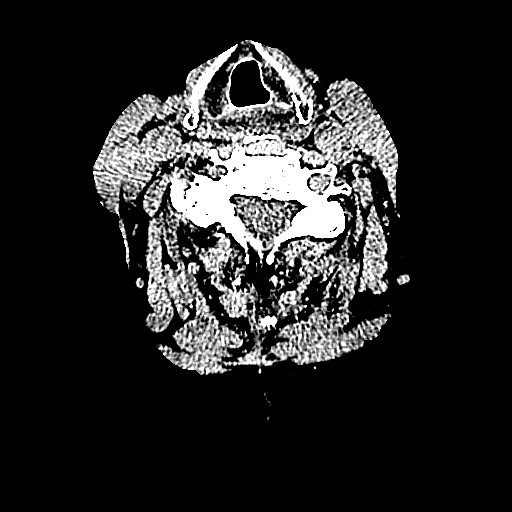
[im 44/76  brain]
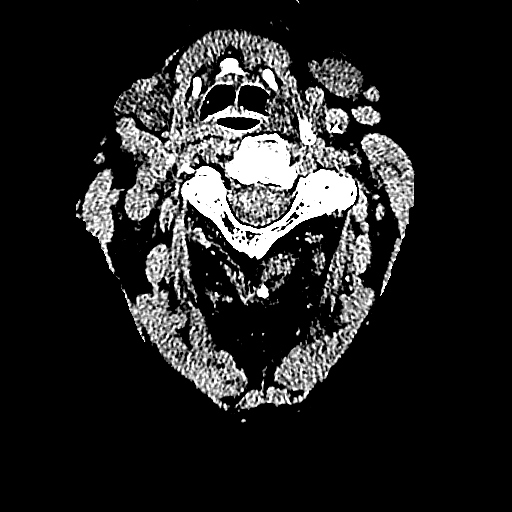
[im 51/76  brain]
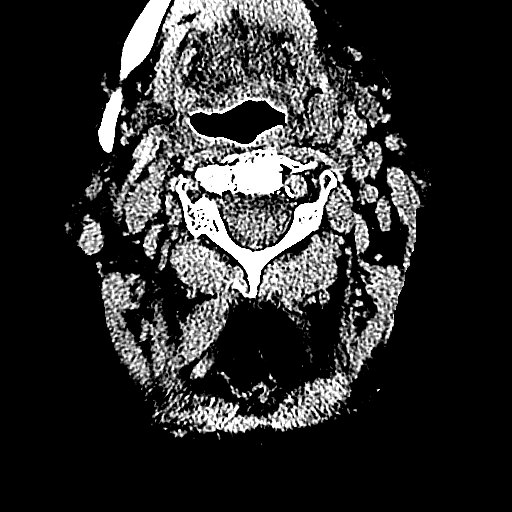
[im 57/76  brain]
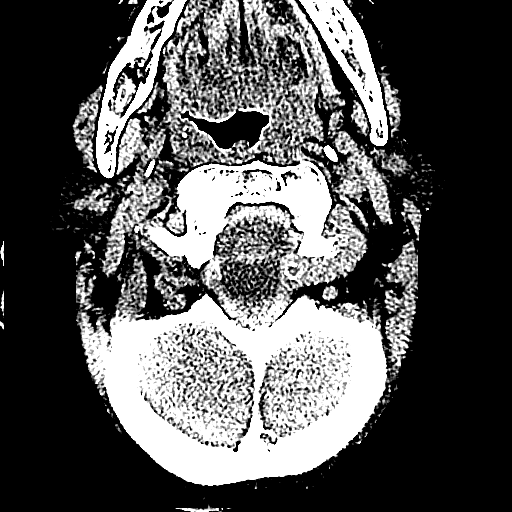
[im 69/76  brain]
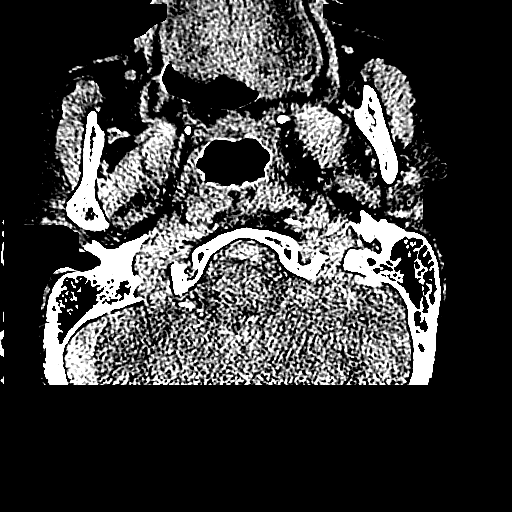

[17 of 47 positions shown; findings below may reference images not displayed]

FINDINGS: CT HEAD FINDINGS

There is no evidence for acute hemorrhage, hydrocephalus, mass
lesion, or abnormal extra-axial fluid collection. Patchy low
attenuation in the deep hemispheric and periventricular white matter
is nonspecific, but likely reflects chronic microvascular ischemic
demyelination. Chronic bilateral lacunar infarcts are seen in the
basal ganglia. On image the 16, there is a new hypodensity in the
left containment which has imaging features compatible with age
indeterminate infarct although it does appear new since 09/04/2013.
Diffuse loss of parenchymal volume is consistent with atrophy.

The visualized paranasal sinuses and mastoid air cells are clear. No
evidence for skull fracture.

CT CERVICAL SPINE FINDINGS

Imaging was obtained from the skullbase through the T1 vertebral
body. There is no evidence for an acute fracture. Marked loss of
disc height is seen at C2-3. There is loss of disc height with
endplate degeneration at C6-7 and to a lesser degree at C5-6. Trace
anterolisthesis of C4-5 is compatible with the degree of facet
degeneration at this level. Similar trace anterolisthesis is seen at
C5-6, stable. There is diffuse facet osteoarthritis throughout
bilaterally. No prevertebral soft tissue swelling.
IMPRESSION: Atrophy with chronic small vessel white matter ischemic disease.
There is a new (since 09/04/2013) age indeterminate lacunar infarct
in the left basal ganglia.

No cervical spine fracture although diffuse degenerative changes are
evident.

## 2015-11-23 IMAGING — CR DG LUMBAR SPINE COMPLETE 4+V
5 series · 5 of 5 positions shown · non-contrast
Comparison: None.

CLINICAL DATA: Altered mental status.

EXAM:
LUMBAR SPINE - COMPLETE 4+ VIEW

[t lumbar spine ap]
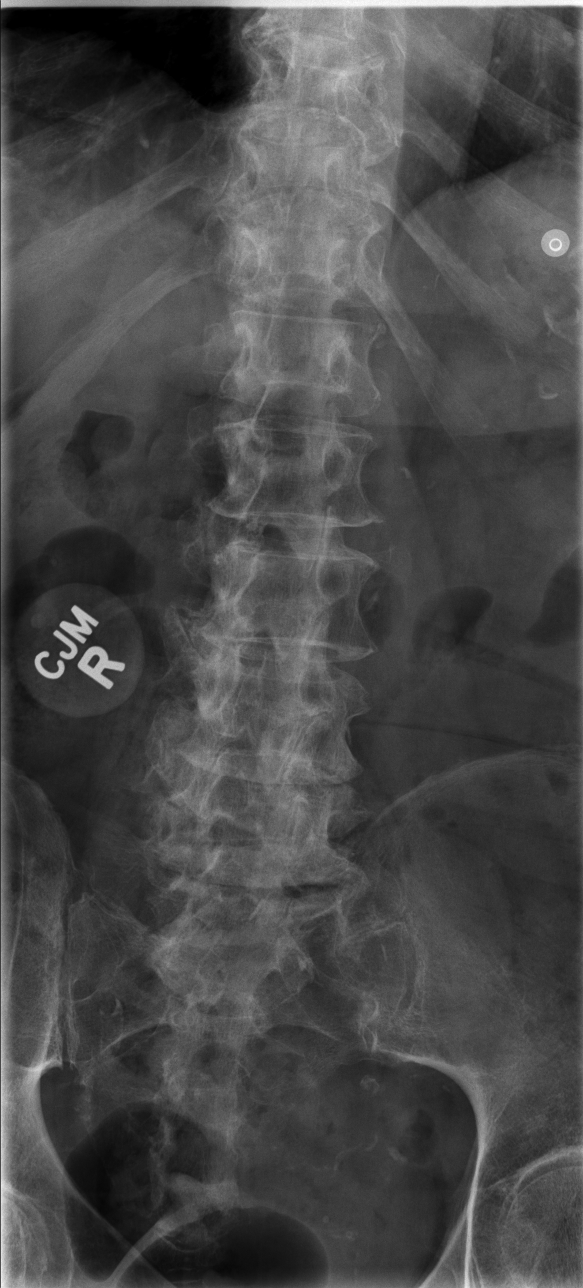

[t lumbar spine obl (1 of 2)]
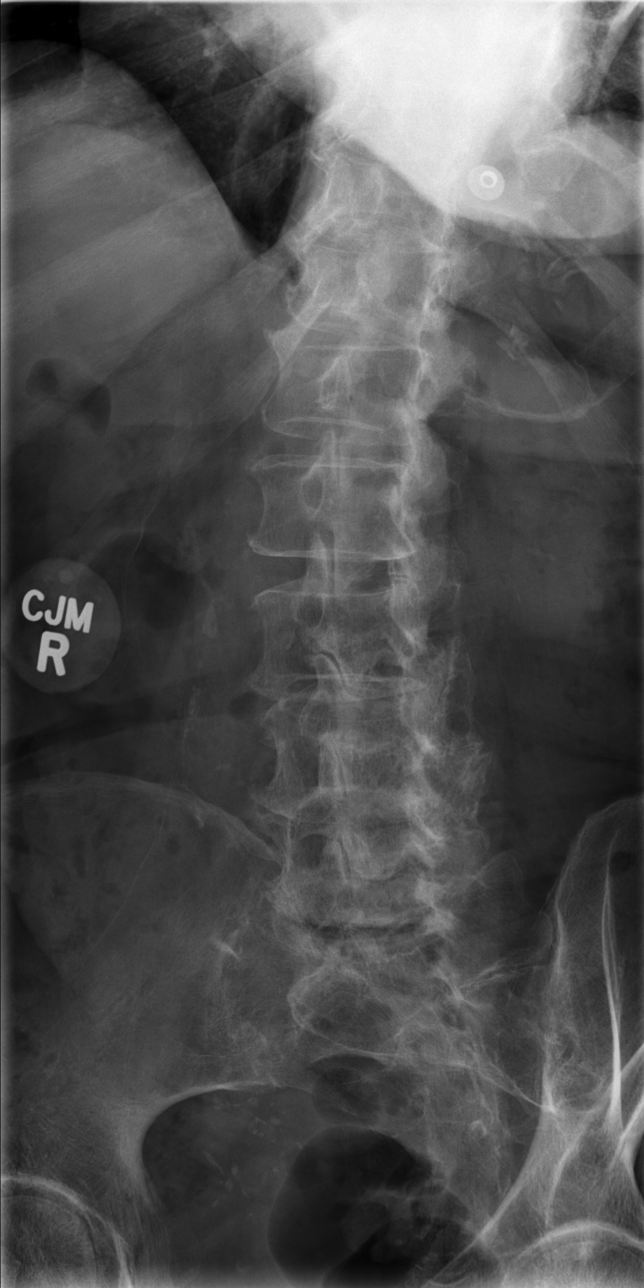

[t lumbar spine obl (2 of 2)]
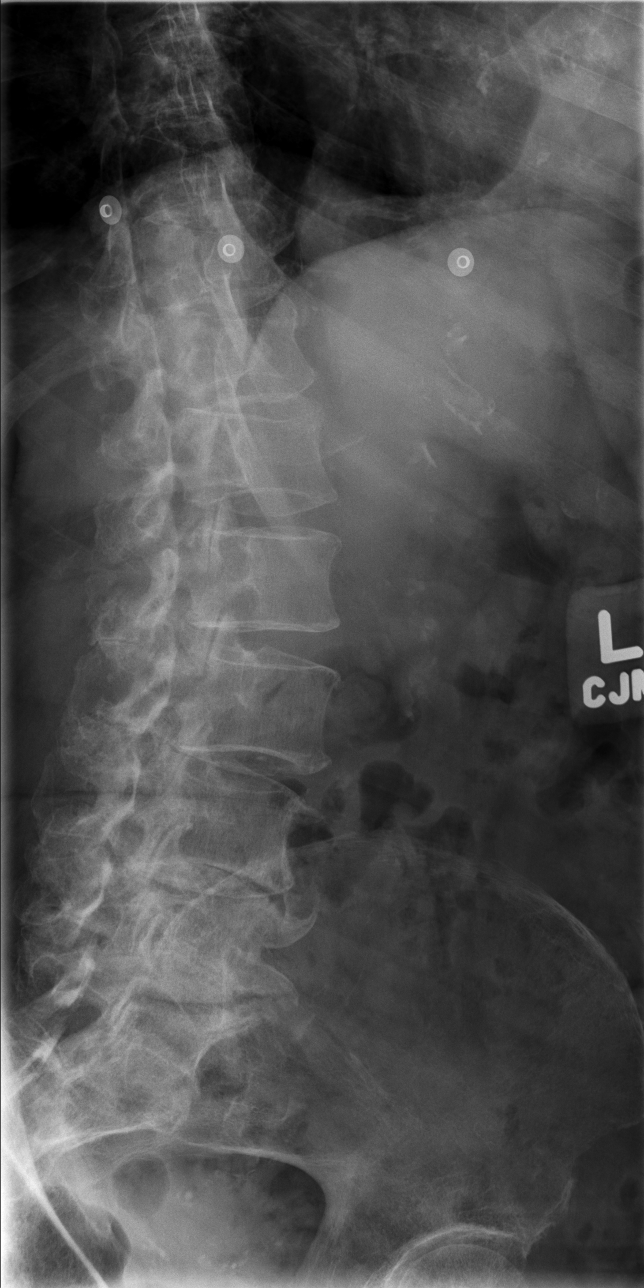

[t lumbar spine lat]
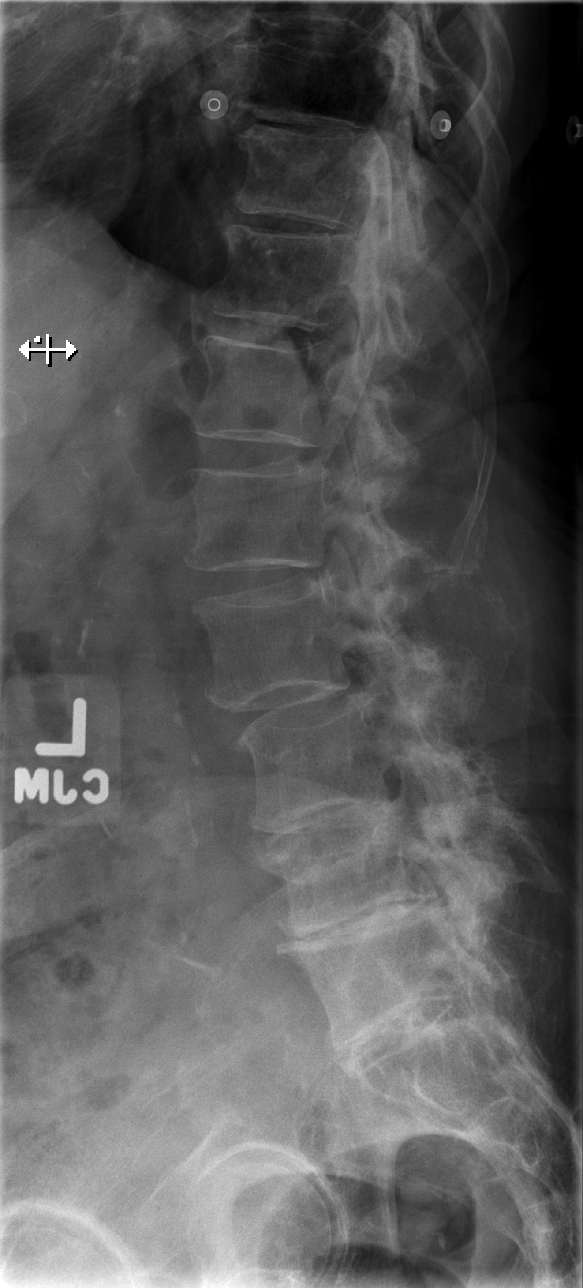

[t lumbar l-5 s-1 spot]
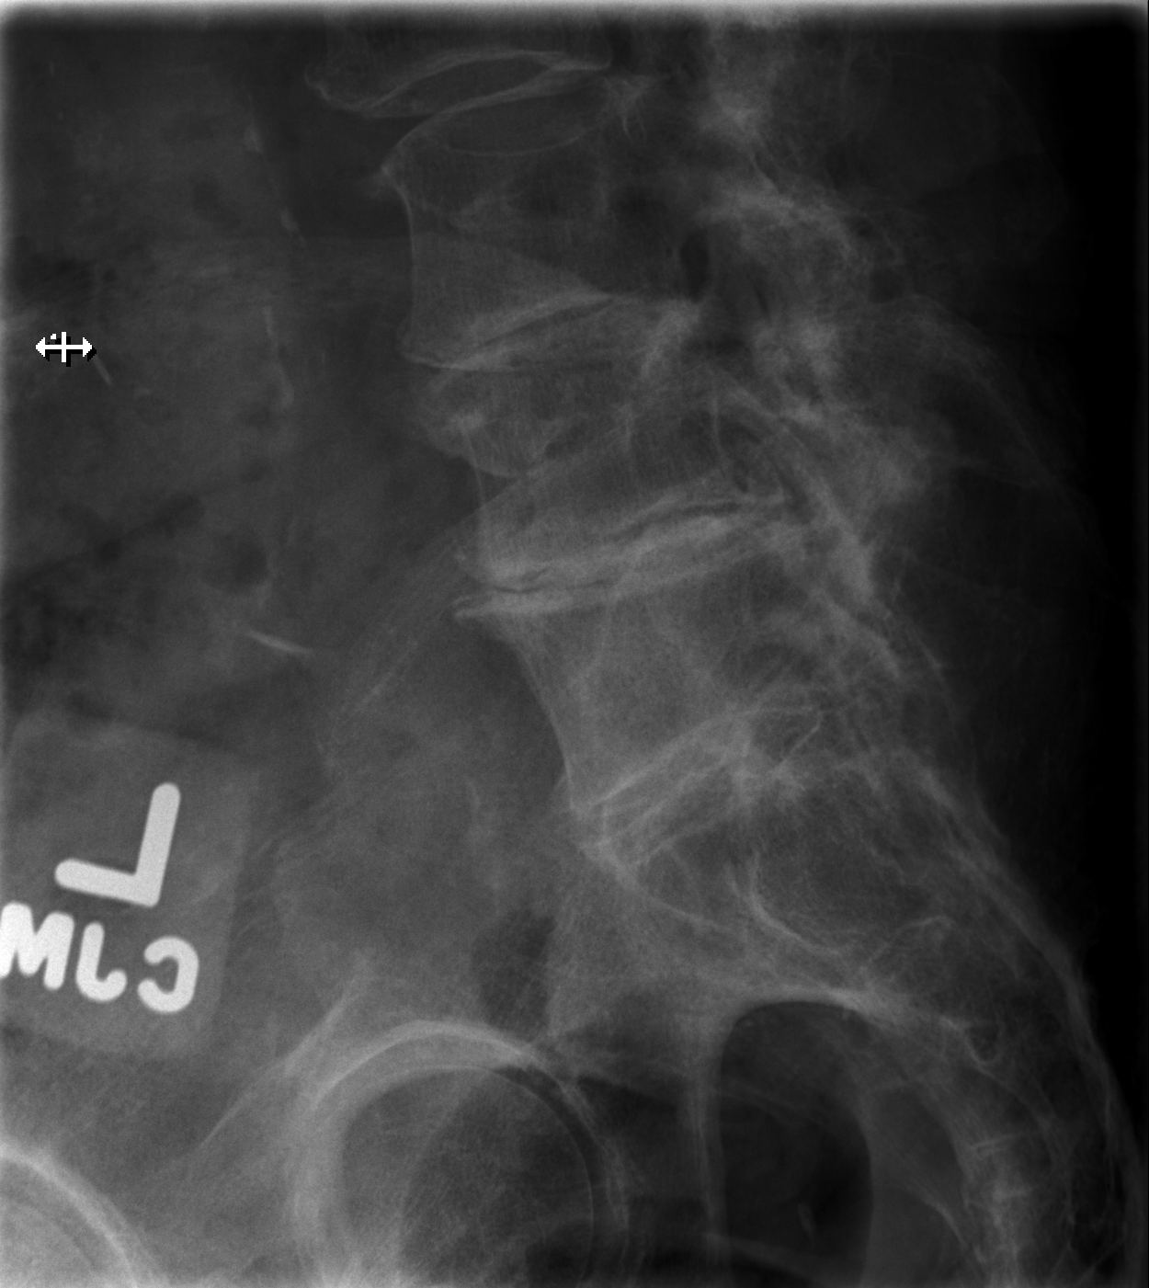

[5 of 5 positions shown; findings below may reference images not displayed]

FINDINGS: No fracture is noted. Grade 1 anterolisthesis of L3-4 is noted
secondary to posterior facet joint hypertrophy. Moderate
degenerative disc disease is noted at this level. Severe
degenerative disc disease is noted at L4-5 and L5-S1. Lumbarized S1
vertebral body is noted.
IMPRESSION: Severe multilevel degenerative disc disease is noted. No acute
abnormality seen in the lumbar spine.

## 2015-12-14 ENCOUNTER — Encounter: Payer: Self-pay | Admitting: Internal Medicine

## 2015-12-14 ENCOUNTER — Non-Acute Institutional Stay (SKILLED_NURSING_FACILITY): Payer: Medicare Other | Admitting: Internal Medicine

## 2015-12-14 DIAGNOSIS — I82412 Acute embolism and thrombosis of left femoral vein: Secondary | ICD-10-CM | POA: Diagnosis not present

## 2015-12-14 DIAGNOSIS — I639 Cerebral infarction, unspecified: Secondary | ICD-10-CM | POA: Diagnosis not present

## 2015-12-14 DIAGNOSIS — I480 Paroxysmal atrial fibrillation: Secondary | ICD-10-CM | POA: Diagnosis not present

## 2015-12-14 DIAGNOSIS — I6381 Other cerebral infarction due to occlusion or stenosis of small artery: Secondary | ICD-10-CM

## 2015-12-14 NOTE — Progress Notes (Signed)
MRN: [id number] Name: Cindy Trevino  Sex: female Age: 82 y.o. DOB: 1924/12/05  [facility] #:  Facility/Room: [facility] /  203 D Level Of Care: SNF Provider: Noah Delaine. Sheppard Coil, MD Emergency Contacts: Extended Emergency Contact Information Primary Emergency Contact: Domingo Mend Address: [id number], [place] Phone: [phone] Relation: Daughter  Code Status: DNR  Allergies: Review of patient's allergies indicates no known allergies.  Chief Complaint  Patient presents with  . Medical Management of Chronic Issues    Routine Visit    HPI: Patient is 82 y.o. female who is being seen for routine issues of basal ganglion infarction, AF and DVT.  Past Medical History  Diagnosis Date  . COPD (chronic obstructive pulmonary disease) ([place])   . History of breast cancer     L breast  . Arthritis   . Dementia   . Dementia   . Hypertension   . Hyperlipidemia     Past Surgical History  Procedure Laterality Date  . Breast lumpectomy      left breast      Medication List       This list is accurate as of: 12/14/15 11:59 PM.  Always use your most recent med list.               acetaminophen 325 MG tablet  Commonly known as:  TYLENOL  Take 650 mg by mouth every 6 (six) hours as needed (for pain). Notify MD if pain is not relieved. Do not exceed 3000 mg in 24 hour period     atorvastatin 40 MG tablet  Commonly known as:  LIPITOR  Take 1 tablet (40 mg total) by mouth daily at 6 PM.     divalproex 125 MG capsule  Commonly known as:  DEPAKOTE SPRINKLE  Take 125 mg by mouth at bedtime.     ELIQUIS 2.5 MG Tabs tablet  Generic drug:  apixaban  Take 2.5 mg by mouth 2 (two) times daily.     feeding supplement (PRO-STAT SUGAR FREE 64) Liqd  Take 30 mLs by mouth daily.     ferrous sulfate 325 (65 FE) MG tablet  Take 325 mg by mouth daily with breakfast.     LORazepam 0.5 MG tablet  Commonly known as:  ATIVAN  Take 1 tablet  by mouth at noon for anxiety. May also take an additional 1 tablet twice daily every 12 hours as needed for increased agitation.     memantine 10 MG tablet  Commonly known as:  NAMENDA  Take 10 mg by mouth 2 (two) times daily.     metoprolol tartrate 25 MG tablet  Commonly known as:  LOPRESSOR  Take 1 tablet (25 mg total) by mouth 3 (three) times daily.     mirtazapine 15 MG tablet  Commonly known as:  REMERON  Take 1/2 tablet ( 7.5 mg ) by mouth at bedtime     PRESERVISION AREDS 2 PO  Take 1 tablet by mouth 2 (two) times daily.     promethazine 25 MG suppository  Commonly known as:  PHENERGAN  If unable to give suppository every 6 hours PRN x 3 doses notify MD if symptoms persist fofr more than 24 hours     Sennosides 8.6 MG Caps  Take 1 tablet by mouth daily     traMADol 50 MG tablet  Commonly known as:  ULTRAM  Take 1 tablet (50 mg total)  by mouth every 6 (six) hours as needed for moderate pain or severe pain.        No orders of the defined types were placed in this encounter.    Immunization History  Administered Date(s) Administered  . Influenza-Unspecified 03/22/2015  . PPD Test 05/08/2014    Social History  Substance Use Topics  . Smoking status: Never Smoker   . Smokeless tobacco: Not on file  . Alcohol Use: No    Review of Systems  UTO 2/2     Filed Vitals:   12/14/15 1341  BP: 116/74  Pulse: 81  Temp: 97.3 F (36.3 C)  Resp: 16    Physical Exam  GENERAL APPEARANCE: Alert, minconversant, No acute distress  SKIN: No diaphoresis rash HEENT: Unremarkable RESPIRATORY: Breathing is even, unlabored. Lung sounds are clear   CARDIOVASCULAR: Heart RRR no murmurs, rubs or gallops. No peripheral edema  GASTROINTESTINAL: Abdomen is soft, non-tender, not distended w/ normal bowel sounds.  GENITOURINARY: Bladder non tender, not distended  MUSCULOSKELETAL: No abnormal joints or musculature NEUROLOGIC: Cranial nerves 2-12 grossly intact. Moves all  extremities PSYCHIATRIC: dementia, no behavioral issues  Patient Active Problem List   Diagnosis Date Noted  . Poor appetite 10/11/2015  . Loss of weight 10/11/2015  . Diarrhea 09/10/2015  . Anxiety 07/04/2015  . Mood disorder ([place]) 07/04/2015  . DVT of lower extremity (deep venous thrombosis) ([place]) 01/23/2015  . OA (osteoarthritis) of knee 09/20/2014  . CKD (chronic kidney disease) stage 3, GFR 30-59 ml/min 08/04/2014  . Anemia of chronic disease 08/04/2014  . Hyperlipidemia   . Essential hypertension 04/20/2014  . Vascular dementia with behavior disturbance 04/20/2014  . Paroxysmal atrial fibrillation ([place]) 04/20/2014  . Basal ganglia infarction ([place]) 04/17/2014  . History of breast cancer 08/23/2012    CBC    Component Value Date/Time   WBC 7.3 10/12/2015   WBC 7.9 04/20/2014 0545   WBC 7.6 12/18/2010 1030   RBC 4.04 04/20/2014 0545   RBC 4.19 12/18/2010 1030   HGB 12.4 10/12/2015   HGB 11.8 12/18/2010 1030   HCT 38 10/12/2015   HCT 35.4 12/18/2010 1030   PLT 252 10/12/2015   PLT 223 12/18/2010 1030   MCV 89.6 04/20/2014 0545   MCV 84.5 12/18/2010 1030   LYMPHSABS 1.5 04/17/2014 1313   LYMPHSABS 1.7 12/18/2010 1030   MONOABS 0.3 04/17/2014 1313   MONOABS 0.4 12/18/2010 1030   EOSABS 0.0 04/17/2014 1313   EOSABS 0.1 12/18/2010 1030   BASOSABS 0.0 04/17/2014 1313   BASOSABS 0.0 12/18/2010 1030    CMP     Component Value Date/Time   NA 142 10/30/2015   NA 139 04/20/2014 0545   K 4.4 10/30/2015   CL 103 04/20/2014 0545   CO2 23 04/20/2014 0545   GLUCOSE 131* 04/20/2014 0545   BUN 33* 10/30/2015   BUN 27* 04/20/2014 0545   CREATININE 1.1 10/30/2015   CREATININE 1.15* 04/20/2014 0545   CALCIUM 8.8 04/20/2014 0545   PROT 6.6 09/04/2013 2358   ALBUMIN 3.4* 09/04/2013 2358   AST 10* 10/18/2015   ALT 5* 10/18/2015   ALKPHOS 49 10/18/2015   BILITOT 0.5 09/04/2013 2358   GFRNONAA 42* 04/20/2014 0545   GFRAA 48* 04/20/2014 0545    Assessment and  Plan  DVT of lower extremity (deep venous thrombosis) The last entry was an error;pt was dx 01/22/2015 while on Xarelto and pt was put on lovenox initially while clotting studies were done which took several months at least  since they were ordered out of a SNF. Pt was transitioned to Eliquis and has done well. At the end of February she will have been treated for 6 months. I have decided to keep her on eliquis for prophylaxis of her a fib and CVA.  Paroxysmal atrial fibrillation Pt  on eliquis for prophylaxis and will continue along with metoprolol for rate  Basal ganglia infarction Pt was initially on xarelto, then eliquis for prophylaxis; will cont eliquis for prophylaxis; pt has had no no stroke sx    Aliscia Clayton D. Sheppard Coil, MD

## 2015-12-22 ENCOUNTER — Encounter: Payer: Self-pay | Admitting: Internal Medicine

## 2016-01-24 ENCOUNTER — Encounter: Payer: Self-pay | Admitting: Internal Medicine

## 2016-01-24 ENCOUNTER — Non-Acute Institutional Stay (SKILLED_NURSING_FACILITY): Payer: Medicare Other | Admitting: Internal Medicine

## 2016-01-24 DIAGNOSIS — F0151 Vascular dementia with behavioral disturbance: Secondary | ICD-10-CM | POA: Diagnosis not present

## 2016-01-24 DIAGNOSIS — I639 Cerebral infarction, unspecified: Secondary | ICD-10-CM

## 2016-01-24 DIAGNOSIS — F01518 Vascular dementia, unspecified severity, with other behavioral disturbance: Secondary | ICD-10-CM

## 2016-01-24 DIAGNOSIS — I10 Essential (primary) hypertension: Secondary | ICD-10-CM

## 2016-01-24 DIAGNOSIS — I480 Paroxysmal atrial fibrillation: Secondary | ICD-10-CM | POA: Diagnosis not present

## 2016-01-24 DIAGNOSIS — I6381 Other cerebral infarction due to occlusion or stenosis of small artery: Secondary | ICD-10-CM

## 2016-01-24 DIAGNOSIS — D638 Anemia in other chronic diseases classified elsewhere: Secondary | ICD-10-CM | POA: Diagnosis not present

## 2016-01-24 NOTE — Progress Notes (Signed)
Location:   [facility] Room Number: 203/D Place of Service:  SNF [phone]) Provider: Henreitta Leber, MD  Patient Care Team: Hennie Duos, MD as PCP - General (Internal Medicine)  Extended Emergency Contact Information Primary Emergency Contact: Domingo Mend Address: [id number], [place] Phone: [phone] Relation: Daughter  Code Status:  DNR Goals of care: Advanced Directive information Advanced Directives 01/24/2016  Does patient have an advance directive? Yes  Type of Advance Directive Out of facility DNR (pink MOST or yellow form)  Does patient want to make changes to advanced directive? No - Patient declined  Copy of advanced directive(s) in chart? Yes  Would patient like information on creating an advanced directive? -     Chief Complaint  Patient presents with  . Medical Management of Chronic Issues    Routine visit   Medical management of numerous medical issues including dementia-history of CVA-hypertension-anxiety-atrial fibrillation- HPI:  Pt is a 82 y.o. female seen today for medical management of chronic diseases.  As noted above-she appears to be doing fairly well-at one point her appetite is decreased and she had weight loss but her nursing this is stabilized she has been started on Remeron and appears to be doing better.  She also had apparently episodic vomiting and diarrhea but this apparently has resolved as well.  She does have a history of atrial fibrillation as well as basal ganglion CVA she is on Eliquist and is on Lopressor for rate control.  She also has a history of hypertension and again continues on Lopressor 25 mg 3 times a day and this appears to be stable most recent blood pressure 116/74 pulse in the 70s.  Tonight she is resting in bed comfortably nursing does not report any issues they are heart and that her appetite appears to be better she is pleasant smiling but  does have periods of agitation according to nursing staff-she does receive Ativan once a day at noon and also has a when necessary dose if needed.               Past Medical History:  Diagnosis Date  . Arthritis   . COPD (chronic obstructive pulmonary disease) ([place])   . Dementia   . Dementia   . History of breast cancer    L breast  . Hyperlipidemia   . Hypertension    Past Surgical History:  Procedure Laterality Date  . BREAST LUMPECTOMY     left breast    No Known Allergies    Medication List       Accurate as of 01/24/16  1:01 PM. Always use your most recent med list.          acetaminophen 325 MG tablet Commonly known as:  TYLENOL Take 650 mg by mouth every 6 (six) hours as needed (for pain). Notify MD if pain is not relieved. Do not exceed 3000 mg in 24 hour period   atorvastatin 40 MG tablet Commonly known as:  LIPITOR Take 1 tablet (40 mg total) by mouth daily at 6 PM.   divalproex 125 MG capsule Commonly known as:  DEPAKOTE SPRINKLE Take 125 mg by mouth at bedtime.   ELIQUIS 2.5 MG Tabs tablet Generic drug:  apixaban Take 2.5 mg by mouth 2 (two) times daily.   feeding supplement (PRO-STAT SUGAR FREE 64) Liqd Take 30 mLs by mouth daily.   ferrous  sulfate 325 (65 FE) MG tablet Take 325 mg by mouth daily with breakfast.   LORazepam 0.5 MG tablet Commonly known as:  ATIVAN Take 1 tablet by mouth at noon for anxiety. May also take an additional 1 tablet twice daily every 12 hours as needed for increased agitation.   memantine 10 MG tablet Commonly known as:  NAMENDA Take 10 mg by mouth 2 (two) times daily.   metoprolol tartrate 25 MG tablet Commonly known as:  LOPRESSOR Take 1 tablet (25 mg total) by mouth 3 (three) times daily.   mirtazapine 15 MG tablet Commonly known as:  REMERON Take 1/2 tablet ( 7.5 mg ) by mouth at bedtime   PRESERVISION AREDS 2 PO Take 1 tablet by mouth 2 (two) times daily.   promethazine 25 MG  suppository Commonly known as:  PHENERGAN If unable to give suppository every 6 hours PRN x 3 doses notify MD if symptoms persist fofr more than 24 hours   Sennosides 8.6 MG Caps Take 1 tablet by mouth daily   traMADol 50 MG tablet Commonly known as:  ULTRAM Take 1 tablet (50 mg total) by mouth every 6 (six) hours as needed for moderate pain or severe pain.       Review of Systems   Unattainable secondary to dementia please see history of present illness per nursing she is stable doing better with her appetite no complaints of shortness of breath chest pain or uncontrolled persistent behaviors  Immunization History  Administered Date(s) Administered  . Influenza-Unspecified 03/22/2015  . PPD Test 05/08/2014   Pertinent  Health Maintenance Due  Topic Date Due  . PNA vac Low Risk Adult (1 of 2 - PCV13) 09/26/2016 (Originally 09/16/1991)  . INFLUENZA VACCINE  01/29/2016  . DEXA SCAN  Completed   No flowsheet data found. Functional Status Survey:    Vitals:   01/24/16 1235  BP: 116/74  Pulse: 75  Resp: 16  Temp: 98.1 F (36.7 C)  TempSrc: Oral  Weight: 167 lb 6.4 oz (75.9 kg)  Height: 5\' 2"  (1.575 m)   Body mass index is 30.62 kg/m. Physical Exam   GENERAL APPEARANCE: Alert, minconversant, No acute distress and comfortably in bed SKIN: No diaphoresis rash HEENT: Unremarkable oropharynx is clear mucous membranes appear fairly moist RESPIRATORY: Breathing is even, unlabored. Lung sounds are clear   CARDIOVASCULAR: Heart RRR with some irregular beats somewhat distant heart sounds no murmurs, rubs or gallops. No peripheral edema  GASTROINTESTINAL: Abdomen is soft, non-tender, not distended w/ normal bowel sounds.  GENITOURINARY: Bladder non tender, not distended  MUSCULOSKELETAL: No abnormal joints or musculature is able to move all 4 extremities grip strength appears to be intact bilaterally NEUROLOGIC: Cranial nerves 2-12 grossly intact. Moves all  extremities PSYCHIATRIC: dementia, no behavioral issues pleasant and cooperative with exam with prompting will follow verbal commands  Labs reviewed:  Recent Labs  10/24/15 10/30/15  NA 143 142  K 4.7 4.4  BUN 36* 33*  CREATININE 1.5* 1.1    Recent Labs  08/10/15 10/18/15  AST 16 10*  ALT 11 5*  ALKPHOS 55 49    Recent Labs  10/12/15  WBC 7.3  HGB 12.4  HCT 38  PLT 252   Lab Results  Component Value Date   TSH 1.05 10/12/2015   Lab Results  Component Value Date   HGBA1C 5.7 (H) 04/18/2014   Lab Results  Component Value Date   CHOL 105 08/07/2014   HDL 34 (A) 08/07/2014  LDLCALC 61 08/07/2014   TRIG 49 08/07/2014   CHOLHDL 5.7 04/18/2014    Significant Diagnostic Results in last 30 days:  No results found.  Assessment/Plan #1-history of dementia with behaviors-this appears stable she continues on Namenda as well as Depakote she is also on Ativan once a routine in once a day when necessary she does have at times periods of agitation but largely her behaviors appear to be well controlled pleasant but confused.  She has had some weight loss this has stabilized appetite has improved she is on Remeron.  #2 history CVA she does continue onEliquist  #3-history ofAfib   this appears rate controlled on Lopressor is also on Eliquist  for anticoagulation  #4 hyperlipidemia LDL was 61 on lab done February 2017 she is on Lipitor will check liver function tests.  Also a lipid panel.  #5 hypertension again this appears to be stable on Lopressor.  #6   Anemia---she continues on iron hemoglobin was 12.4 on lab done April 2017 will update  .  Of note  will obtain a CBC CMP and fasting lipid panel for updated values.  [id number]

## 2016-01-30 LAB — BASIC METABOLIC PANEL
BUN: 34 mg/dL — AB (ref 4–21)
Creatinine: 1.2 mg/dL — AB (ref 0.5–1.1)
Glucose: 107 mg/dL
POTASSIUM: 5 mmol/L (ref 3.4–5.3)
SODIUM: 145 mmol/L (ref 137–147)

## 2016-01-30 LAB — LIPID PANEL
CHOLESTEROL: 112 mg/dL (ref 0–200)
HDL: 26 mg/dL — AB (ref 35–70)
LDL Cholesterol: 60 mg/dL
TRIGLYCERIDES: 130 mg/dL (ref 40–160)

## 2016-01-30 LAB — HEPATIC FUNCTION PANEL
ALT: 8 U/L (ref 7–35)
AST: 16 U/L (ref 13–35)
Alkaline Phosphatase: 58 U/L (ref 25–125)
Bilirubin, Total: 0.3 mg/dL

## 2016-01-30 LAB — CBC AND DIFFERENTIAL
HCT: 38 % (ref 36–46)
HEMOGLOBIN: 12.4 g/dL (ref 12.0–16.0)
Platelets: 225 10*3/uL (ref 150–399)
WBC: 8.1 10^3/mL

## 2016-01-31 ENCOUNTER — Other Ambulatory Visit: Payer: Self-pay

## 2016-01-31 MED ORDER — LORAZEPAM 0.5 MG PO TABS: ORAL_TABLET | ORAL | 5 refills | Status: DC

## 2016-01-31 NOTE — Telephone Encounter (Signed)
Faxed to [facility] Fax Number: [phone], Phone Number [phone]  

## 2016-02-01 ENCOUNTER — Encounter: Payer: Self-pay | Admitting: Internal Medicine

## 2016-02-01 NOTE — Progress Notes (Signed)
MRN: [id number] Name: Cindy Trevino  Sex: female Age: 82 y.o. DOB: [birth year]  [place] #:  Facility/Room: [facility] / 203 D Level Of Care: SNF Provider: Noah Delaine. Sheppard Coil, MD Emergency Contacts: Extended Emergency Contact Information Primary Emergency Contact: Domingo Mend Address: [id number], [place] Phone: [phone] Relation: Daughter  Code Status: DNR  Allergies: Review of patient's allergies indicates no known allergies.  Chief Complaint  Patient presents with  . Medical Management of Chronic Issues    Routine Visit    HPI: Patient is 82 y.o. female who   Past Medical History:  Diagnosis Date  . Arthritis   . COPD (chronic obstructive pulmonary disease) ([place])   . Dementia   . Dementia   . History of breast cancer    L breast  . Hyperlipidemia   . Hypertension     Past Surgical History:  Procedure Laterality Date  . BREAST LUMPECTOMY     left breast      Medication List       Accurate as of 02/01/16 11:04 AM. Always use your most recent med list.          acetaminophen 325 MG tablet Commonly known as:  TYLENOL Take 650 mg by mouth every 6 (six) hours as needed (for pain). Notify MD if pain is not relieved. Do not exceed 3000 mg in 24 hour period   atorvastatin 40 MG tablet Commonly known as:  LIPITOR Take 1 tablet (40 mg total) by mouth daily at 6 PM.   divalproex 125 MG capsule Commonly known as:  DEPAKOTE SPRINKLE Take 125 mg by mouth at bedtime.   ELIQUIS 2.5 MG Tabs tablet Generic drug:  apixaban Take 2.5 mg by mouth 2 (two) times daily.   feeding supplement (PRO-STAT SUGAR FREE 64) Liqd Take 30 mLs by mouth daily at 6 PM.   ferrous sulfate 325 (65 FE) MG tablet Take 325 mg by mouth daily with breakfast.   LORazepam 0.5 MG tablet Commonly known as:  ATIVAN Take 0.5 mg by mouth daily at 12 noon.   LORazepam 0.5 MG tablet Commonly known as:  ATIVAN Take 0.5 mg by mouth 2 (two)  times daily.   memantine 10 MG tablet Commonly known as:  NAMENDA Take 10 mg by mouth 2 (two) times daily.   metoprolol tartrate 25 MG tablet Commonly known as:  LOPRESSOR Take 1 tablet (25 mg total) by mouth 3 (three) times daily.   mirtazapine 15 MG tablet Commonly known as:  REMERON Take 1/2 tablet ( 7.5 mg ) by mouth at bedtime   PRESERVISION AREDS 2 PO Take 1 tablet by mouth 2 (two) times daily.   promethazine 25 MG suppository Commonly known as:  PHENERGAN If unable to give suppository every 6 hours PRN x 3 doses notify MD if symptoms persist fofr more than 24 hours   senna 8.6 MG Tabs tablet Commonly known as:  SENOKOT Take 1 tablet by mouth daily.   traMADol 50 MG tablet Commonly known as:  ULTRAM Take 1 tablet (50 mg total) by mouth every 6 (six) hours as needed for moderate pain or severe pain.       Meds ordered this encounter  Medications  . LORazepam (ATIVAN) 0.5 MG tablet    Sig: Take 0.5 mg by mouth daily at 12 noon.  Marland Kitchen LORazepam (ATIVAN) 0.5 MG tablet    Sig: Take 0.5 mg by mouth  2 (two) times daily.  Marland Kitchen senna (SENOKOT) 8.6 MG TABS tablet    Sig: Take 1 tablet by mouth daily.    Immunization History  Administered Date(s) Administered  . Influenza-Unspecified 03/22/2015  . PPD Test 05/08/2014    Social History  Substance Use Topics  . Smoking status: Never Smoker  . Smokeless tobacco: Never Used  . Alcohol use No    Review of Systems  DATA OBTAINED: from patient, nurse, medical record, family member GENERAL:  no fevers, fatigue, appetite changes SKIN: No itching, rash HEENT: No complaint RESPIRATORY: No cough, wheezing, SOB CARDIAC: No chest pain, palpitations, lower extremity edema  GI: No abdominal pain, No N/V/D or constipation, No heartburn or reflux  GU: No dysuria, frequency or urgency, or incontinence  MUSCULOSKELETAL: No unrelieved bone/joint pain NEUROLOGIC: No headache, dizziness  PSYCHIATRIC: No overt anxiety or  sadness  Vitals:   02/01/16 1046  BP: 116/74  Pulse: 72  Resp: 17  Temp: 98.1 F (36.7 C)    Physical Exam  GENERAL APPEARANCE: Alert, conversant, No acute distress  SKIN: No diaphoresis rash HEENT: Unremarkable RESPIRATORY: Breathing is even, unlabored. Lung sounds are clear   CARDIOVASCULAR: Heart RRR no murmurs, rubs or gallops. No peripheral edema  GASTROINTESTINAL: Abdomen is soft, non-tender, not distended w/ normal bowel sounds.  GENITOURINARY: Bladder non tender, not distended  MUSCULOSKELETAL: No abnormal joints or musculature NEUROLOGIC: Cranial nerves 2-12 grossly intact. Moves all extremities PSYCHIATRIC: Mood and affect appropriate to situation, no behavioral issues  Patient Active Problem List   Diagnosis Date Noted  . Poor appetite 10/11/2015  . Loss of weight 10/11/2015  . Diarrhea 09/10/2015  . Anxiety 07/04/2015  . Mood disorder ([place]) 07/04/2015  . DVT of lower extremity (deep venous thrombosis) ([place]) 01/23/2015  . OA (osteoarthritis) of knee 09/20/2014  . CKD (chronic kidney disease) stage 3, GFR 30-59 ml/min 08/04/2014  . Anemia of chronic disease 08/04/2014  . Hyperlipidemia   . Essential hypertension 04/20/2014  . Vascular dementia with behavior disturbance 04/20/2014  . Paroxysmal atrial fibrillation ([place]) 04/20/2014  . Basal ganglia infarction ([place]) 04/17/2014  . History of breast cancer 08/23/2012    CBC    Component Value Date/Time   WBC 7.3 10/12/2015   WBC 7.9 04/20/2014 0545   RBC 4.04 04/20/2014 0545   HGB 12.4 10/12/2015   HGB 11.8 12/18/2010 1030   HCT 38 10/12/2015   HCT 35.4 12/18/2010 1030   PLT 252 10/12/2015   PLT 223 12/18/2010 1030   MCV 89.6 04/20/2014 0545   MCV 84.5 12/18/2010 1030   LYMPHSABS 1.5 04/17/2014 1313   LYMPHSABS 1.7 12/18/2010 1030   MONOABS 0.3 04/17/2014 1313   MONOABS 0.4 12/18/2010 1030   EOSABS 0.0 04/17/2014 1313   EOSABS 0.1 12/18/2010 1030   BASOSABS 0.0 04/17/2014 1313   BASOSABS 0.0  12/18/2010 1030    CMP     Component Value Date/Time   NA 142 10/30/2015   K 4.4 10/30/2015   CL 103 04/20/2014 0545   CO2 23 04/20/2014 0545   GLUCOSE 131 (H) 04/20/2014 0545   BUN 33 (A) 10/30/2015   CREATININE 1.1 10/30/2015   CREATININE 1.15 (H) 04/20/2014 0545   CALCIUM 8.8 04/20/2014 0545   PROT 6.6 09/04/2013 2358   ALBUMIN 3.4 (L) 09/04/2013 2358   AST 10 (A) 10/18/2015   ALT 5 (A) 10/18/2015   ALKPHOS 49 10/18/2015   BILITOT 0.5 09/04/2013 2358   GFRNONAA 42 (L) 04/20/2014 0545   GFRAA 48 (L)  04/20/2014 0545    Assessment and Plan  No problem-specific Assessment & Plan notes found for this encounter.   Noah Delaine. Sheppard Coil, MD    This encounter was created in error - please disregard.

## 2016-02-22 ENCOUNTER — Non-Acute Institutional Stay (SKILLED_NURSING_FACILITY): Payer: Medicare Other | Admitting: Internal Medicine

## 2016-02-22 ENCOUNTER — Encounter: Payer: Self-pay | Admitting: Internal Medicine

## 2016-02-22 DIAGNOSIS — E785 Hyperlipidemia, unspecified: Secondary | ICD-10-CM | POA: Diagnosis not present

## 2016-02-22 DIAGNOSIS — I480 Paroxysmal atrial fibrillation: Secondary | ICD-10-CM | POA: Diagnosis not present

## 2016-02-22 DIAGNOSIS — N183 Chronic kidney disease, stage 3 unspecified: Secondary | ICD-10-CM

## 2016-02-22 NOTE — Progress Notes (Signed)
MRN: [id number] Name: Cindy Trevino  Sex: female Age: 82 y.o. DOB: 1924-08-26  [place] #:  Facility/Room: [facility] / 203 D Level Of Care: SNF Provider: Noah Delaine. Sheppard Coil, MD Emergency Contacts: Extended Emergency Contact Information Primary Emergency Contact: Domingo Mend Address: [id number] [place], [place] of [place] Phone: [phone] Relation: Daughter  Code Status: DNR  Allergies: Review of patient's allergies indicates no known allergies.  Chief Complaint  Patient presents with  . Medical Management of Chronic Issues    Routine Visit    HPI: Patient is 82 y.o. female with dementia who is being seen for routine issues of AF, CKD3 and HLD. PT sits n hall ecery day in her [facility] across from the nursing station.  Past Medical History:  Diagnosis Date  . Arthritis   . COPD (chronic obstructive pulmonary disease) ([place])   . Dementia   . Dementia   . History of breast cancer    L breast  . Hyperlipidemia   . Hypertension     Past Surgical History:  Procedure Laterality Date  . BREAST LUMPECTOMY     left breast      Medication List       Accurate as of 02/22/16 11:59 PM. Always use your most recent med list.          acetaminophen 325 MG tablet Commonly known as:  TYLENOL Take 650 mg by mouth every 6 (six) hours as needed (for pain). Notify MD if pain is not relieved. Do not exceed 3000 mg in 24 hour period   atorvastatin 40 MG tablet Commonly known as:  LIPITOR Take 1 tablet (40 mg total) by mouth daily at 6 PM.   divalproex 125 MG capsule Commonly known as:  DEPAKOTE SPRINKLE Take 125 mg by mouth at bedtime.   ELIQUIS 2.5 MG Tabs tablet Generic drug:  apixaban Take 2.5 mg by mouth 2 (two) times daily.   feeding supplement (PRO-STAT SUGAR FREE 64) Liqd Take 30 mLs by mouth daily at 6 PM.   ferrous sulfate 325 (65 FE) MG tablet Take 325 mg by mouth daily with breakfast.   LORazepam 0.5 MG tablet Commonly known as:   ATIVAN Take 0.5 mg by mouth daily at 12 noon.   LORazepam 0.5 MG tablet Commonly known as:  ATIVAN Take 0.5 mg by mouth 2 (two) times daily.   memantine 10 MG tablet Commonly known as:  NAMENDA Take 10 mg by mouth 2 (two) times daily.   metoprolol tartrate 25 MG tablet Commonly known as:  LOPRESSOR Take 1 tablet (25 mg total) by mouth 3 (three) times daily.   mirtazapine 15 MG tablet Commonly known as:  REMERON Take 1/2 tablet ( 7.5 mg ) by mouth at bedtime   PRESERVISION AREDS 2 PO Take 1 tablet by mouth 2 (two) times daily.   promethazine 25 MG suppository Commonly known as:  PHENERGAN If unable to give suppository every 6 hours PRN x 3 doses notify MD if symptoms persist fofr more than 24 hours   senna 8.6 MG Tabs tablet Commonly known as:  SENOKOT Take 1 tablet by mouth daily.   traMADol 50 MG tablet Commonly known as:  ULTRAM Take 1 tablet (50 mg total) by mouth every 6 (six) hours as needed for moderate pain or severe pain.       No orders of the defined types were placed in this encounter.   Immunization History  Administered Date(s) Administered  . Influenza-Unspecified 03/22/2015  . PPD Test 05/08/2014    Social History  Substance Use Topics  . Smoking status: Never Smoker  . Smokeless tobacco: Never Used  . Alcohol use No    Review of Systems  UTO 2/2 dementia; nursing without concerns  Vitals:   02/22/16 0845  BP: 116/74  Pulse: 77  Resp: 16  Temp: 98.1 F (36.7 C)    Physical Exam  GENERAL APPEARANCE: Alert, non conversant, No acute distress  SKIN: No diaphoresis rash HEENT: Unremarkable RESPIRATORY: Breathing is even, unlabored. Lung sounds are clear   CARDIOVASCULAR: Heart RRR no murmurs, rubs or gallops. No peripheral edema  GASTROINTESTINAL: Abdomen is soft, non-tender, not distended w/ normal bowel sounds.  GENITOURINARY: Bladder non tender, not distended  MUSCULOSKELETAL: No abnormal joints or musculature NEUROLOGIC: Cranial  nerves 2-12 grossly intact. Moves all extremities PSYCHIATRIC: pleasant, dementia, no behavioral issues  Patient Active Problem List   Diagnosis Date Noted  . Poor appetite 10/11/2015  . Loss of weight 10/11/2015  . Diarrhea 09/10/2015  . Anxiety 07/04/2015  . Mood disorder ([place]) 07/04/2015  . DVT of lower extremity (deep venous thrombosis) ([place]) 01/23/2015  . OA (osteoarthritis) of knee 09/20/2014  . CKD (chronic kidney disease) stage 3, GFR 30-59 ml/min 08/04/2014  . Anemia of chronic disease 08/04/2014  . Hyperlipidemia   . Essential hypertension 04/20/2014  . Vascular dementia with behavior disturbance 04/20/2014  . Paroxysmal atrial fibrillation ([place]) 04/20/2014  . Basal ganglia infarction ([place]) 04/17/2014  . History of breast cancer 08/23/2012    CBC    Component Value Date/Time   WBC 7.3 10/12/2015   WBC 7.9 04/20/2014 0545   RBC 4.04 04/20/2014 0545   HGB 12.4 10/12/2015   HGB 11.8 12/18/2010 1030   HCT 38 10/12/2015   HCT 35.4 12/18/2010 1030   PLT 252 10/12/2015   PLT 223 12/18/2010 1030   MCV 89.6 04/20/2014 0545   MCV 84.5 12/18/2010 1030   LYMPHSABS 1.5 04/17/2014 1313   LYMPHSABS 1.7 12/18/2010 1030   MONOABS 0.3 04/17/2014 1313   MONOABS 0.4 12/18/2010 1030   EOSABS 0.0 04/17/2014 1313   EOSABS 0.1 12/18/2010 1030   BASOSABS 0.0 04/17/2014 1313   BASOSABS 0.0 12/18/2010 1030    CMP     Component Value Date/Time   NA 142 10/30/2015   K 4.4 10/30/2015   CL 103 04/20/2014 0545   CO2 23 04/20/2014 0545   GLUCOSE 131 (H) 04/20/2014 0545   BUN 33 (A) 10/30/2015   CREATININE 1.1 10/30/2015   CREATININE 1.15 (H) 04/20/2014 0545   CALCIUM 8.8 04/20/2014 0545   PROT 6.6 09/04/2013 2358   ALBUMIN 3.4 (L) 09/04/2013 2358   AST 10 (A) 10/18/2015   ALT 5 (A) 10/18/2015   ALKPHOS 49 10/18/2015   BILITOT 0.5 09/04/2013 2358   GFRNONAA 42 (L) 04/20/2014 0545   GFRAA 48 (L) 04/20/2014 0545    Assessment and Plan  Paroxysmal atrial fibrillation No  reported signs or sx of RVR; pt continues stable on metoprolol for rate 25 mg TID and eliquis 2.5 mg BID as prophylaxis.  CKD (chronic kidney disease) stage 3, GFR 30-59 ml/min No recent GFR but BUN/Cr this month were 34/1.21 which shows a small but continuing decline; will cont to monitor at intervals  Hyperlipidemia This month LDL was 60, HDL 26 on lipitor 40 mg; LFT's nl;plan to cont lipitor.   Noah Delaine. Sheppard Coil, MD

## 2016-02-24 ENCOUNTER — Encounter: Payer: Self-pay | Admitting: Internal Medicine

## 2016-02-24 NOTE — Assessment & Plan Note (Signed)
This month LDL was 60, HDL 26 on lipitor 40 mg; LFT's nl;plan to cont lipitor.

## 2016-02-24 NOTE — Assessment & Plan Note (Signed)
No reported signs or sx of RVR; pt continues stable on metoprolol for rate 25 mg TID and eliquis 2.5 mg BID as prophylaxis.

## 2016-02-24 NOTE — Assessment & Plan Note (Signed)
No recent GFR but BUN/Cr this month were 34/1.21 which shows a small but continuing decline; will cont to monitor at intervals

## 2016-03-24 ENCOUNTER — Encounter: Payer: Self-pay | Admitting: Internal Medicine

## 2016-03-24 ENCOUNTER — Non-Acute Institutional Stay (SKILLED_NURSING_FACILITY): Payer: Medicare Other | Admitting: Internal Medicine

## 2016-03-24 DIAGNOSIS — F39 Unspecified mood [affective] disorder: Secondary | ICD-10-CM | POA: Diagnosis not present

## 2016-03-24 DIAGNOSIS — F0151 Vascular dementia with behavioral disturbance: Secondary | ICD-10-CM

## 2016-03-24 DIAGNOSIS — I10 Essential (primary) hypertension: Secondary | ICD-10-CM | POA: Diagnosis not present

## 2016-03-24 DIAGNOSIS — F01518 Vascular dementia, unspecified severity, with other behavioral disturbance: Secondary | ICD-10-CM

## 2016-03-24 NOTE — Progress Notes (Signed)
Location:  [facility] Room Number: [place] of Service:  [phone])  [name], MD  Patient Care Team: Hennie Duos, MD as PCP - General (Internal Medicine)  Extended Emergency Contact Information Primary Emergency Contact: Domingo Mend Address: [id number], [place] of [place] Phone: [phone] Relation: Daughter    Allergies: Review of patient's allergies indicates no known allergies.  Chief Complaint  Patient presents with  . Medical Management of Chronic Issues    Routine Visit    HPI: Patient is 82 y.o. female who is being seen for routine issues of HTN, dementia and mood disorder.  Past Medical History:  Diagnosis Date  . Arthritis   . COPD (chronic obstructive pulmonary disease) ([place])   . Dementia   . Dementia   . History of breast cancer    L breast  . Hyperlipidemia   . Hypertension     Past Surgical History:  Procedure Laterality Date  . BREAST LUMPECTOMY     left breast      Medication List       Accurate as of 03/24/16 11:59 PM. Always use your most recent med list.          acetaminophen 325 MG tablet Commonly known as:  TYLENOL Take 650 mg by mouth every 6 (six) hours as needed (for pain). Notify MD if pain is not relieved. Do not exceed 3000 mg in 24 hour period   atorvastatin 40 MG tablet Commonly known as:  LIPITOR Take 1 tablet (40 mg total) by mouth daily at 6 PM.   divalproex 125 MG capsule Commonly known as:  DEPAKOTE SPRINKLE Take 125 mg by mouth at bedtime.   ELIQUIS 2.5 MG Tabs tablet Generic drug:  apixaban Take 2.5 mg by mouth 2 (two) times daily.   feeding supplement (PRO-STAT SUGAR FREE 64) Liqd Take 30 mLs by mouth daily at 6 PM.   ferrous sulfate 325 (65 FE) MG tablet Take 325 mg by mouth daily with breakfast.   LORazepam 0.5 MG tablet Commonly known as:  ATIVAN Take 0.5 mg by mouth daily at 12 noon.   LORazepam 0.5 MG  tablet Commonly known as:  ATIVAN Take 0.5 mg by mouth 2 (two) times daily.   memantine 10 MG tablet Commonly known as:  NAMENDA Take 10 mg by mouth 2 (two) times daily.   metoprolol tartrate 25 MG tablet Commonly known as:  LOPRESSOR Take 1 tablet (25 mg total) by mouth 3 (three) times daily.   mirtazapine 15 MG tablet Commonly known as:  REMERON Take 1/2 tablet ( 7.5 mg ) by mouth at bedtime   PRESERVISION AREDS 2 PO Take 1 tablet by mouth 2 (two) times daily.   promethazine 25 MG suppository Commonly known as:  PHENERGAN If unable to give suppository every 6 hours PRN x 3 doses notify MD if symptoms persist fofr more than 24 hours   senna 8.6 MG Tabs tablet Commonly known as:  SENOKOT Take 1 tablet by mouth daily.   traMADol 50 MG tablet Commonly known as:  ULTRAM Take 1 tablet (50 mg total) by mouth every 6 (six) hours as needed for moderate pain or severe pain.       No orders of the defined types were placed in this encounter.   Immunization History  Administered Date(s) Administered  . Influenza-Unspecified 03/22/2015  . PPD Test 05/08/2014  Social History  Substance Use Topics  . Smoking status: Never Smoker  . Smokeless tobacco: Never Used  . Alcohol use No    Review of Systems  UTO 2/2 dementia    Vitals:   03/24/16 1104  BP: 116/74  Pulse: 81  Resp: 16  Temp: 98.1 F (36.7 C)   Body mass index is 30 kg/m. Physical Exam  GENERAL APPEARANCE: Alert, No acute distress  SKIN: No diaphoresis rash HEENT: Unremarkable RESPIRATORY: Breathing is even, unlabored. Lung sounds are clear   CARDIOVASCULAR: Heart RRR no murmurs, rubs or gallops. No peripheral edema  GASTROINTESTINAL: Abdomen is soft, non-tender, not distended w/ normal bowel sounds.  GENITOURINARY: Bladder non tender, not distended  MUSCULOSKELETAL: No abnormal joints or musculature NEUROLOGIC: Cranial nerves 2-12 grossly intact. Moves all extremities PSYCHIATRIC: dementia, no  behavioral issues  Patient Active Problem List   Diagnosis Date Noted  . Poor appetite 10/11/2015  . Loss of weight 10/11/2015  . Diarrhea 09/10/2015  . Anxiety 07/04/2015  . Mood disorder ([place]) 07/04/2015  . DVT of lower extremity (deep venous thrombosis) ([place]) 01/23/2015  . OA (osteoarthritis) of knee 09/20/2014  . CKD (chronic kidney disease) stage 3, GFR 30-59 ml/min 08/04/2014  . Anemia of chronic disease 08/04/2014  . Hyperlipidemia   . Essential hypertension 04/20/2014  . Vascular dementia with behavior disturbance 04/20/2014  . Paroxysmal atrial fibrillation ([place]) 04/20/2014  . Basal ganglia infarction ([place]) 04/17/2014  . History of breast cancer 08/23/2012    CMP     Component Value Date/Time   NA 145 01/30/2016   K 5.0 01/30/2016   CL 103 04/20/2014 0545   CO2 23 04/20/2014 0545   GLUCOSE 131 (H) 04/20/2014 0545   BUN 34 (A) 01/30/2016   CREATININE 1.2 (A) 01/30/2016   CREATININE 1.15 (H) 04/20/2014 0545   CALCIUM 8.8 04/20/2014 0545   PROT 6.6 09/04/2013 2358   ALBUMIN 3.4 (L) 09/04/2013 2358   AST 16 01/30/2016   ALT 8 01/30/2016   ALKPHOS 58 01/30/2016   BILITOT 0.5 09/04/2013 2358   GFRNONAA 42 (L) 04/20/2014 0545   GFRAA 48 (L) 04/20/2014 0545    Recent Labs  10/24/15 10/30/15 01/30/16  NA 143 142 145  K 4.7 4.4 5.0  BUN 36* 33* 34*  CREATININE 1.5* 1.1 1.2*    Recent Labs  09/12/15 10/18/15 01/30/16  AST 14 10* 16  ALT 9 5* 8  ALKPHOS 39 49 58    Recent Labs  09/12/15 10/12/15 01/30/16  WBC 7.3 7.3 8.1  HGB 11.4* 12.4 12.4  HCT 36 38 38  PLT 226 252 225    Recent Labs  01/30/16  CHOL 112  LDLCALC 60  TRIG 130   No results found for: [facility] Lab Results  Component Value Date   TSH 1.05 10/12/2015   Lab Results  Component Value Date   HGBA1C 5.7 (H) 04/18/2014   Lab Results  Component Value Date   CHOL 112 01/30/2016   HDL 26 (A) 01/30/2016   LDLCALC 60 01/30/2016   TRIG 130 01/30/2016   CHOLHDL 5.7 04/18/2014     Significant Diagnostic Results in last 30 days:  No results found.  Assessment and Plan  Essential hypertension Controlled; cont metoprolol 25 mg TID  Vascular dementia with behavior disturbance Stable without major declines;plan to cont namenda 10 mg BID  Mood disorder (HCC) Pt has remained stable on depakote 125 mg qHS;plan to cont current med     [employer]. Sheppard Coil, MD

## 2016-03-24 NOTE — Progress Notes (Signed)
Opened in error; Disregard.

## 2016-04-13 ENCOUNTER — Encounter: Payer: Self-pay | Admitting: Internal Medicine

## 2016-04-13 NOTE — Assessment & Plan Note (Signed)
Stable without major declines;plan to cont namenda 10 mg BID

## 2016-04-13 NOTE — Assessment & Plan Note (Signed)
Controlled; cont metoprolol 25 mg TID

## 2016-04-13 NOTE — Assessment & Plan Note (Signed)
Pt has remained stable on depakote 125 mg qHS;plan to cont current med

## 2016-04-23 ENCOUNTER — Non-Acute Institutional Stay (SKILLED_NURSING_FACILITY): Payer: Medicare Other | Admitting: Internal Medicine

## 2016-04-23 ENCOUNTER — Encounter: Payer: Self-pay | Admitting: Internal Medicine

## 2016-04-23 DIAGNOSIS — F419 Anxiety disorder, unspecified: Secondary | ICD-10-CM | POA: Diagnosis not present

## 2016-04-23 DIAGNOSIS — I480 Paroxysmal atrial fibrillation: Secondary | ICD-10-CM

## 2016-04-23 DIAGNOSIS — F32A Depression, unspecified: Secondary | ICD-10-CM

## 2016-04-23 DIAGNOSIS — E785 Hyperlipidemia, unspecified: Secondary | ICD-10-CM | POA: Diagnosis not present

## 2016-04-23 DIAGNOSIS — F329 Major depressive disorder, single episode, unspecified: Secondary | ICD-10-CM

## 2016-04-23 HISTORY — DX: Depression, unspecified: F32.A

## 2016-04-23 NOTE — Assessment & Plan Note (Signed)
Stable on prophylaxis with eliquis nd rate controlled with metoprolol 25 mg TID

## 2016-04-23 NOTE — Progress Notes (Signed)
Location:  [facility] Room Number: [place] of Service:  SNF [phone])  Cindy Homes, MD  Patient Care Team: Cindy Duos, MD as PCP - General (Internal Medicine)  Extended Emergency Contact Information Primary Emergency Contact: Cindy Trevino Address: [id number] [place], [place] Phone: [phone] Relation: Daughter    Allergies: Review of patient's allergies indicates no known allergies.  Chief Complaint  Patient presents with  . Medical Management of Chronic Issues    Routine Visit    HPI: Patient is 82 y.o. female who is being seen for routine issues of anxiety, HLD and AF.  Past Medical History:  Diagnosis Date  . Arthritis   . COPD (chronic obstructive pulmonary disease) ([place])   . Dementia   . Dementia   . History of breast cancer    L breast  . Hyperlipidemia   . Hypertension     Past Surgical History:  Procedure Laterality Date  . BREAST LUMPECTOMY     left breast      Medication List       Accurate as of 04/23/16  6:51 PM. Always use your most recent med list.          acetaminophen 325 MG tablet Commonly known as:  TYLENOL Take 650 mg by mouth every 6 (six) hours as needed (for pain). Notify MD if pain is not relieved. Do not exceed 3000 mg in 24 hour period   atorvastatin 40 MG tablet Commonly known as:  LIPITOR Take 1 tablet (40 mg total) by mouth daily at 6 PM.   ELIQUIS 2.5 MG Tabs tablet Generic drug:  apixaban Take 2.5 mg by mouth 2 (two) times daily.   feeding supplement (PRO-STAT SUGAR FREE 64) Liqd Take 30 mLs by mouth daily at 6 PM.   ferrous sulfate 325 (65 FE) MG tablet Take 325 mg by mouth daily with breakfast.   LORazepam 0.5 MG tablet Commonly known as:  ATIVAN Take 0.5 mg by mouth daily at 12 noon.   LORazepam 0.5 MG tablet Commonly known as:  ATIVAN Take 0.5 mg by mouth 2 (two) times daily as needed.   memantine 10 MG tablet Commonly  known as:  NAMENDA Take 10 mg by mouth 2 (two) times daily.   metoprolol tartrate 25 MG tablet Commonly known as:  LOPRESSOR Take 1 tablet (25 mg total) by mouth 3 (three) times daily.   mirtazapine 15 MG tablet Commonly known as:  REMERON Take 1/2 tablet ( 7.5 mg ) by mouth at bedtime   PRESERVISION AREDS 2 PO Take 1 tablet by mouth 2 (two) times daily.   promethazine 25 MG suppository Commonly known as:  PHENERGAN If unable to give suppository every 6 hours PRN x 3 doses notify MD if symptoms persist fofr more than 24 hours   senna 8.6 MG Tabs tablet Commonly known as:  SENOKOT Take 1 tablet by mouth daily.   traMADol 50 MG tablet Commonly known as:  ULTRAM Take 1 tablet (50 mg total) by mouth every 6 (six) hours as needed for moderate pain or severe pain.       No orders of the defined types were placed in this encounter.   Immunization History  Administered Date(s) Administered  . Influenza-Unspecified 03/22/2015  . PPD Test 05/08/2014    Social History  Substance Use Topics  . Smoking status: Never Smoker  . Smokeless tobacco:  Never Used  . Alcohol use No    Review of Systems  UTO    Vitals:   04/23/16 1036  BP: 116/74  Pulse: 77  Resp: 18  Temp: 98.1 F (36.7 C)   Body mass index is 30.51 kg/m. Physical Exam  GENERAL APPEARANCE: Alert, mod conversant, No acute distress  SKIN: No diaphoresis rash HEENT: Unremarkable RESPIRATORY: Breathing is even, unlabored. Lung sounds are clear   CARDIOVASCULAR: Heart RRR no murmurs, rubs or gallops. No peripheral edema  GASTROINTESTINAL: Abdomen is soft, non-tender, not distended w/ normal bowel sounds.  GENITOURINARY: Bladder non tender, not distended  MUSCULOSKELETAL: No abnormal joints or musculature NEUROLOGIC: Cranial nerves 2-12 grossly intact. Moves all extremities PSYCHIATRIC: dementia, no behavioral issues  Patient Active Problem List   Diagnosis Date Noted  . Depression 04/23/2016  . Poor  appetite 10/11/2015  . Loss of weight 10/11/2015  . Diarrhea 09/10/2015  . Anxiety 07/04/2015  . Mood disorder ([place]) 07/04/2015  . DVT of lower extremity (deep venous thrombosis) ([place]) 01/23/2015  . OA (osteoarthritis) of knee 09/20/2014  . CKD (chronic kidney disease) stage 3, GFR 30-59 ml/min 08/04/2014  . Anemia of chronic disease 08/04/2014  . Hyperlipidemia   . Essential hypertension 04/20/2014  . Vascular dementia with behavior disturbance 04/20/2014  . Paroxysmal atrial fibrillation ([place]) 04/20/2014  . Basal ganglia infarction ([place]) 04/17/2014  . History of breast cancer 08/23/2012    CMP     Component Value Date/Time   NA 145 01/30/2016   K 5.0 01/30/2016   CL 103 04/20/2014 0545   CO2 23 04/20/2014 0545   GLUCOSE 131 (H) 04/20/2014 0545   BUN 34 (A) 01/30/2016   CREATININE 1.2 (A) 01/30/2016   CREATININE 1.15 (H) 04/20/2014 0545   CALCIUM 8.8 04/20/2014 0545   PROT 6.6 09/04/2013 2358   ALBUMIN 3.4 (L) 09/04/2013 2358   AST 16 01/30/2016   ALT 8 01/30/2016   ALKPHOS 58 01/30/2016   BILITOT 0.5 09/04/2013 2358   GFRNONAA 42 (L) 04/20/2014 0545   GFRAA 48 (L) 04/20/2014 0545    Recent Labs  10/24/15 10/30/15 01/30/16  NA 143 142 145  K 4.7 4.4 5.0  BUN 36* 33* 34*  CREATININE 1.5* 1.1 1.2*    Recent Labs  09/12/15 10/18/15 01/30/16  AST 14 10* 16  ALT 9 5* 8  ALKPHOS 39 49 58    Recent Labs  09/12/15 10/12/15 01/30/16  WBC 7.3 7.3 8.1  HGB 11.4* 12.4 12.4  HCT 36 38 38  PLT 226 252 225    Recent Labs  01/30/16  CHOL 112  LDLCALC 60  TRIG 130   No results found for: [facility] Lab Results  Component Value Date   TSH 1.05 10/12/2015   Lab Results  Component Value Date   HGBA1C 5.7 (H) 04/18/2014   Lab Results  Component Value Date   CHOL 112 01/30/2016   HDL 26 (A) 01/30/2016   LDLCALC 60 01/30/2016   TRIG 130 01/30/2016   CHOLHDL 5.7 04/18/2014    Significant Diagnostic Results in last 30 days:  No results  found.  Assessment and Plan   Anxiety Controlled with ativan 0.5 mg at noon daily with a BID prn  Hyperlipidemia Controlled;cont lipitor 40 mg daily  Paroxysmal atrial fibrillation Stable on prophylaxis with eliquis nd rate controlled with metoprolol 25 mg TID      Cindy Trevino. Sheppard Coil, MD

## 2016-04-23 NOTE — Assessment & Plan Note (Signed)
Controlled with ativan 0.5 mg at noon daily with a BID prn

## 2016-04-23 NOTE — Assessment & Plan Note (Signed)
Controlled;cont lipitor 40 mg daily

## 2016-04-29 ENCOUNTER — Non-Acute Institutional Stay (SKILLED_NURSING_FACILITY): Payer: Medicare Other | Admitting: Internal Medicine

## 2016-04-29 ENCOUNTER — Encounter: Payer: Self-pay | Admitting: Internal Medicine

## 2016-04-29 DIAGNOSIS — R55 Syncope and collapse: Secondary | ICD-10-CM

## 2016-04-29 NOTE — Progress Notes (Signed)
Location:  [facility] Room Number: [address] Place of Service:  SNF [phone])  [name], MD  Patient Care Team: Hennie Duos, MD as PCP - General (Internal Medicine)  Extended Emergency Contact Information Primary Emergency Contact: Domingo Mend Address: [id number] [place], [place] Phone: [phone] Relation: Daughter    Allergies: Review of patient's allergies indicates no known allergies.  Chief Complaint  Patient presents with  . Acute Visit    Acute    HPI: Patient is 82 y.o. female who nursing asked me to see acutely because she has a fainting spell. Pt was sitting in her wheel chair, said her stomach hurt then slumped over. Had LOC or semi-LOC for < 2 minutes. Pt was taken to bed and VS were very good and O2 sat was 95% on RA. Pt had a small soft BM and now wants to go back to the hall. No fever or cough known today. Stools are regular and soft.  Past Medical History:  Diagnosis Date  . Arthritis   . COPD (chronic obstructive pulmonary disease) ([place])   . Dementia   . Dementia   . History of breast cancer    L breast  . Hyperlipidemia   . Hypertension     Past Surgical History:  Procedure Laterality Date  . BREAST LUMPECTOMY     left breast      Medication List       Accurate as of 04/29/16  3:44 PM. Always use your most recent med list.          acetaminophen 325 MG tablet Commonly known as:  TYLENOL Take 650 mg by mouth every 6 (six) hours as needed (for pain). Notify MD if pain is not relieved. Do not exceed 3000 mg in 24 hour period   atorvastatin 40 MG tablet Commonly known as:  LIPITOR Take 1 tablet (40 mg total) by mouth daily at 6 PM.   ELIQUIS 2.5 MG Tabs tablet Generic drug:  apixaban Take 2.5 mg by mouth 2 (two) times daily.   feeding supplement (PRO-STAT SUGAR FREE 64) Liqd Take 30 mLs by mouth daily at 6 PM.   ferrous sulfate 325 (65 FE) MG tablet Take  325 mg by mouth daily with breakfast.   LORazepam 0.5 MG tablet Commonly known as:  ATIVAN Take 0.5 mg by mouth daily at 12 noon.   LORazepam 0.5 MG tablet Commonly known as:  ATIVAN Take 0.5 mg by mouth 2 (two) times daily as needed.   memantine 10 MG tablet Commonly known as:  NAMENDA Take 10 mg by mouth 2 (two) times daily.   metoprolol tartrate 25 MG tablet Commonly known as:  LOPRESSOR Take 1 tablet (25 mg total) by mouth 3 (three) times daily.   mirtazapine 15 MG tablet Commonly known as:  REMERON Take 1/2 tablet ( 7.5 mg ) by mouth at bedtime   PRESERVISION AREDS 2 PO Take 1 tablet by mouth 2 (two) times daily.   promethazine 25 MG suppository Commonly known as:  PHENERGAN If unable to give suppository every 6 hours PRN x 3 doses notify MD if symptoms persist fofr more than 24 hours   senna 8.6 MG Tabs tablet Commonly known as:  SENOKOT Take 1 tablet by mouth daily.   traMADol 50 MG tablet Commonly known as:  ULTRAM Take 1 tablet (50 mg total) by mouth every 6 (six) hours  as needed for moderate pain or severe pain.       No orders of the defined types were placed in this encounter.   Immunization History  Administered Date(s) Administered  . Influenza-Unspecified 03/22/2015  . PPD Test 05/08/2014    Social History  Substance Use Topics  . Smoking status: Never Smoker  . Smokeless tobacco: Never Used  . Alcohol use No    Review of Systems  UTO 2/2 dementia    Vitals:   04/29/16 1535  BP: 116/74  Pulse: 80  Resp: 16  Temp: 98.1 F (36.7 C)   Body mass index is 30.51 kg/m. Physical Exam  GENERAL APPEARANCE: Alert, mod conversant, No acute distress  SKIN: No diaphoresis rash HEENT: Unremarkable RESPIRATORY: Breathing is even, unlabored. Lung sounds are clear   CARDIOVASCULAR: Heart RRR no murmurs, rubs or gallops. No peripheral edema  GASTROINTESTINAL: Abdomen is soft, non-tender, not distended w/ normal bowel sounds.  GENITOURINARY:  Bladder non tender, not distended  MUSCULOSKELETAL: No abnormal joints or musculature NEUROLOGIC: Cranial nerves 2-12 grossly intact. Moves all extremities PSYCHIATRIC: dementia, acting as per usual, no behavioral issues  Patient Active Problem List   Diagnosis Date Noted  . Depression 04/23/2016  . Poor appetite 10/11/2015  . Loss of weight 10/11/2015  . Diarrhea 09/10/2015  . Anxiety 07/04/2015  . Mood disorder ([place]) 07/04/2015  . DVT of lower extremity (deep venous thrombosis) ([place]) 01/23/2015  . OA (osteoarthritis) of knee 09/20/2014  . CKD (chronic kidney disease) stage 3, GFR 30-59 ml/min 08/04/2014  . Anemia of chronic disease 08/04/2014  . Hyperlipidemia   . Essential hypertension 04/20/2014  . Vascular dementia with behavior disturbance 04/20/2014  . Paroxysmal atrial fibrillation ([place]) 04/20/2014  . Basal ganglia infarction ([place]) 04/17/2014  . History of breast cancer 08/23/2012    CMP     Component Value Date/Time   NA 145 01/30/2016   K 5.0 01/30/2016   CL 103 04/20/2014 0545   CO2 23 04/20/2014 0545   GLUCOSE 131 (H) 04/20/2014 0545   BUN 34 (A) 01/30/2016   CREATININE 1.2 (A) 01/30/2016   CREATININE 1.15 (H) 04/20/2014 0545   CALCIUM 8.8 04/20/2014 0545   PROT 6.6 09/04/2013 2358   ALBUMIN 3.4 (L) 09/04/2013 2358   AST 16 01/30/2016   ALT 8 01/30/2016   ALKPHOS 58 01/30/2016   BILITOT 0.5 09/04/2013 2358   GFRNONAA 42 (L) 04/20/2014 0545   GFRAA 48 (L) 04/20/2014 0545    Recent Labs  10/24/15 10/30/15 01/30/16  NA 143 142 145  K 4.7 4.4 5.0  BUN 36* 33* 34*  CREATININE 1.5* 1.1 1.2*    Recent Labs  09/12/15 10/18/15 01/30/16  AST 14 10* 16  ALT 9 5* 8  ALKPHOS 39 49 58    Recent Labs  09/12/15 10/12/15 01/30/16  WBC 7.3 7.3 8.1  HGB 11.4* 12.4 12.4  HCT 36 38 38  PLT 226 252 225    Recent Labs  01/30/16  CHOL 112  LDLCALC 60  TRIG 130   No results found for: [facility] Lab Results  Component Value Date   TSH 1.05 10/12/2015     Lab Results  Component Value Date   HGBA1C 5.7 (H) 04/18/2014   Lab Results  Component Value Date   CHOL 112 01/30/2016   HDL 26 (A) 01/30/2016   LDLCALC 60 01/30/2016   TRIG 130 01/30/2016   CHOLHDL 5.7 04/18/2014    Significant Diagnostic Results in last 30 days:  No results  found.  Assessment and Plan  SYNCOPE,VASOVAGAL - pt alert and baseline after, not a seizure. Another nurse said she just now c/o pain "down there" so will check CBC, BMP, U/A with C&S; will cont to monitor     Time spent > 25 min Kristyna Bradstreet D. Sheppard Coil, MD

## 2016-05-01 LAB — BASIC METABOLIC PANEL
BUN: 33 mg/dL — AB (ref 4–21)
Creatinine: 1.3 mg/dL — AB (ref 0.5–1.1)
Glucose: 72 mg/dL
Potassium: 4.5 mmol/L (ref 3.4–5.3)
SODIUM: 142 mmol/L (ref 137–147)

## 2016-05-01 LAB — CBC AND DIFFERENTIAL
HEMATOCRIT: 31 % — AB (ref 36–46)
Hemoglobin: 9.9 g/dL — AB (ref 12.0–16.0)
PLATELETS: 201 10*3/uL (ref 150–399)
WBC: 6.7 10^3/mL

## 2016-05-28 ENCOUNTER — Non-Acute Institutional Stay (SKILLED_NURSING_FACILITY): Payer: Medicare Other | Admitting: Internal Medicine

## 2016-05-28 DIAGNOSIS — F329 Major depressive disorder, single episode, unspecified: Secondary | ICD-10-CM | POA: Diagnosis not present

## 2016-05-28 DIAGNOSIS — F419 Anxiety disorder, unspecified: Secondary | ICD-10-CM | POA: Diagnosis not present

## 2016-05-28 DIAGNOSIS — F39 Unspecified mood [affective] disorder: Secondary | ICD-10-CM | POA: Diagnosis not present

## 2016-05-28 DIAGNOSIS — F32A Depression, unspecified: Secondary | ICD-10-CM

## 2016-05-28 NOTE — Progress Notes (Signed)
Location:  [facility] Room Number: 203D Place of Service:  SNF [phone])  [name], MD  Patient Care Team: Hennie Duos, MD as PCP - General (Internal Medicine)  Extended Emergency Contact Information Primary Emergency Contact: Domingo Mend Address: [id number] [place], [place] Phone: [phone] Relation: Daughter    Allergies: Patient has no known allergies.  Chief Complaint  Patient presents with  . Medical Management of Chronic Issues    Routine Visit    HPI: Patient is 82 y.o. female who is being seen for routine issues of depression, mood disorder and anxiety.  Past Medical History:  Diagnosis Date  . Arthritis   . COPD (chronic obstructive pulmonary disease) ([place])   . Dementia   . Dementia   . History of breast cancer    L breast  . Hyperlipidemia   . Hypertension     Past Surgical History:  Procedure Laterality Date  . BREAST LUMPECTOMY     left breast      Medication List       Accurate as of 05/28/16 11:59 PM. Always use your most recent med list.          acetaminophen 325 MG tablet Commonly known as:  TYLENOL Take 650 mg by mouth every 6 (six) hours as needed (for pain). Notify MD if pain is not relieved. Do not exceed 3000 mg in 24 hour period   atorvastatin 40 MG tablet Commonly known as:  LIPITOR Take 1 tablet (40 mg total) by mouth daily at 6 PM.   ELIQUIS 2.5 MG Tabs tablet Generic drug:  apixaban Take 2.5 mg by mouth 2 (two) times daily.   feeding supplement (PRO-STAT SUGAR FREE 64) Liqd Take 30 mLs by mouth daily at 6 PM.   Ferrous Sulfate 220 (44 Fe) MG/5ML Liqd Take 7.5 mLs by mouth daily. 330 mg   LORazepam 0.5 MG tablet Commonly known as:  ATIVAN Take 0.5 mg by mouth daily at 12 noon.   LORazepam 0.5 MG tablet Commonly known as:  ATIVAN Take 0.25 mg by mouth every 12 (twelve) hours as needed for anxiety.   memantine 10 MG  tablet Commonly known as:  NAMENDA Take 10 mg by mouth 2 (two) times daily.   metoprolol tartrate 25 MG tablet Commonly known as:  LOPRESSOR Take 1 tablet (25 mg total) by mouth 3 (three) times daily.   mirtazapine 15 MG tablet Commonly known as:  REMERON Take 1/2 tablet ( 7.5 mg ) by mouth at bedtime   PRESERVISION AREDS 2 PO Take 1 tablet by mouth 2 (two) times daily.   promethazine 25 MG suppository Commonly known as:  PHENERGAN If unable to give suppository every 6 hours PRN x 3 doses notify MD if symptoms persist fofr more than 24 hours   senna 8.6 MG Tabs tablet Commonly known as:  SENOKOT Take 1 tablet by mouth daily.   traMADol 50 MG tablet Commonly known as:  ULTRAM Take 1 tablet (50 mg total) by mouth every 6 (six) hours as needed for moderate pain or severe pain.       Meds ordered this encounter  Medications  . LORazepam (ATIVAN) 0.5 MG tablet    Sig: Take 0.25 mg by mouth every 12 (twelve) hours as needed for anxiety.  . Ferrous Sulfate 220 (44 Fe) MG/5ML LIQD    Sig: Take 7.5 mLs by mouth daily.  330 mg    Immunization History  Administered Date(s) Administered  . Influenza-Unspecified 03/22/2015  . PPD Test 05/08/2014    Social History  Substance Use Topics  . Smoking status: Never Smoker  . Smokeless tobacco: Never Used  . Alcohol use No    Review of Systems  UTO 2/2 dementia    Vitals:   05/28/16 0913  BP: 116/74  Pulse: 75  Resp: 16  Temp: 98.1 F (36.7 C)   Body mass index is 30.8 kg/m. Physical Exam  GENERAL APPEARANCE: Alert, min conversant, No acute distress ; sits every day in her WC across from nursing station and my office SKIN: No diaphoresis rash HEENT: Unremarkable RESPIRATORY: Breathing is even, unlabored. Lung sounds are clear   CARDIOVASCULAR: Heart RRR no murmurs, rubs or gallops. No peripheral edema  GASTROINTESTINAL: Abdomen is soft, non-tender, not distended w/ normal bowel sounds.  GENITOURINARY: Bladder non  tender, not distended  MUSCULOSKELETAL: No abnormal joints or musculature NEUROLOGIC: Cranial nerves 2-12 grossly intact. Moves all extremities PSYCHIATRIC: dementia, no behavioral issues  Patient Active Problem List   Diagnosis Date Noted  . Depression 04/23/2016  . Poor appetite 10/11/2015  . Loss of weight 10/11/2015  . Diarrhea 09/10/2015  . Anxiety 07/04/2015  . Mood disorder ([place]) 07/04/2015  . DVT of lower extremity (deep venous thrombosis) ([place]) 01/23/2015  . OA (osteoarthritis) of knee 09/20/2014  . CKD (chronic kidney disease) stage 3, GFR 30-59 ml/min 08/04/2014  . Anemia of chronic disease 08/04/2014  . Hyperlipidemia   . Essential hypertension 04/20/2014  . Vascular dementia with behavior disturbance 04/20/2014  . Paroxysmal atrial fibrillation ([place]) 04/20/2014  . Basal ganglia infarction ([place]) 04/17/2014  . History of breast cancer 08/23/2012    CMP     Component Value Date/Time   NA 142 05/01/2016   K 4.5 05/01/2016   CL 103 04/20/2014 0545   CO2 23 04/20/2014 0545   GLUCOSE 131 (H) 04/20/2014 0545   BUN 33 (A) 05/01/2016   CREATININE 1.3 (A) 05/01/2016   CREATININE 1.15 (H) 04/20/2014 0545   CALCIUM 8.8 04/20/2014 0545   PROT 6.6 09/04/2013 2358   ALBUMIN 3.4 (L) 09/04/2013 2358   AST 16 01/30/2016   ALT 8 01/30/2016   ALKPHOS 58 01/30/2016   BILITOT 0.5 09/04/2013 2358   GFRNONAA 42 (L) 04/20/2014 0545   GFRAA 48 (L) 04/20/2014 0545    Recent Labs  10/30/15 01/30/16 05/01/16  NA 142 145 142  K 4.4 5.0 4.5  BUN 33* 34* 33*  CREATININE 1.1 1.2* 1.3*    Recent Labs  09/12/15 10/18/15 01/30/16  AST 14 10* 16  ALT 9 5* 8  ALKPHOS 39 49 58    Recent Labs  10/12/15 01/30/16 05/01/16  WBC 7.3 8.1 6.7  HGB 12.4 12.4 9.9*  HCT 38 38 31*  PLT 252 225 201    Recent Labs  01/30/16  CHOL 112  LDLCALC 60  TRIG 130   No results found for: [facility] Lab Results  Component Value Date   TSH 1.05 10/12/2015   Lab Results  Component Value  Date   HGBA1C 5.7 (H) 04/18/2014   Lab Results  Component Value Date   CHOL 112 01/30/2016   HDL 26 (A) 01/30/2016   LDLCALC 60 01/30/2016   TRIG 130 01/30/2016   CHOLHDL 5.7 04/18/2014    Significant Diagnostic Results in last 30 days:  No results found.  Assessment and Plan  Depression Chronic and stable;cont remeron 15 mg po  qHS  Mood disorder (HCC) Stable;plan to cont depakote 125 mg qHS; cont to monitor  Anxiety Controlled; cont ativan 0.5 mg daily; pt had prn ativan ordered, with new laws her occ pen ativan will come from the medicine room    Loye Reininger D. Sheppard Coil, MD

## 2016-06-08 ENCOUNTER — Encounter: Payer: Self-pay | Admitting: Internal Medicine

## 2016-06-08 NOTE — Assessment & Plan Note (Signed)
Chronic and stable;cont remeron 15 mg po qHS

## 2016-06-08 NOTE — Assessment & Plan Note (Signed)
Stable;plan to cont depakote 125 mg qHS; cont to monitor

## 2016-06-08 NOTE — Assessment & Plan Note (Signed)
Controlled; cont ativan 0.5 mg daily; pt had prn ativan ordered, with new laws her occ pen ativan will come from the medicine room

## 2016-06-27 ENCOUNTER — Non-Acute Institutional Stay (SKILLED_NURSING_FACILITY): Payer: Medicare Other | Admitting: Internal Medicine

## 2016-06-27 ENCOUNTER — Encounter: Payer: Self-pay | Admitting: Internal Medicine

## 2016-06-27 DIAGNOSIS — N183 Chronic kidney disease, stage 3 unspecified: Secondary | ICD-10-CM

## 2016-06-27 DIAGNOSIS — I10 Essential (primary) hypertension: Secondary | ICD-10-CM | POA: Diagnosis not present

## 2016-06-27 DIAGNOSIS — F0151 Vascular dementia with behavioral disturbance: Secondary | ICD-10-CM

## 2016-06-27 DIAGNOSIS — F01518 Vascular dementia, unspecified severity, with other behavioral disturbance: Secondary | ICD-10-CM

## 2016-06-27 NOTE — Progress Notes (Signed)
Location:  [facility] Room Number: 203D Place of Service:  SNF [phone])  Cindy Homes, MD  Patient Care Team: Cindy Duos, MD as PCP - General (Internal Medicine)  Extended Emergency Contact Information Primary Emergency Contact: Cindy Trevino Address: [id number] [place], [place] Phone: [phone] Relation: Daughter    Allergies: Patient has no known allergies.  Chief Complaint  Patient presents with  . Medical Management of Chronic Issues    Routine Visit    HPI: Patient is 82 y.o. female who is being seen for routine issue of CKD3, HTN and vascular dementia.  Past Medical History:  Diagnosis Date  . Arthritis   . COPD (chronic obstructive pulmonary disease) ([place])   . Dementia 02/02/2012  . History of breast cancer 2009   Left breast  s/p lumpectomy and rads. declined hormonal tx. last mammogram 2011  . Hyperlipidemia   . Hypertension   . SVT (supraventricular tachycardia) ([facility])     Past Surgical History:  Procedure Laterality Date  . BREAST LUMPECTOMY Left 2009   left breast  . CESAREAN SECTION    . EYE SURGERY     cataracts x3    Allergies as of 06/27/2016   No Known Allergies     Medication List       Accurate as of 06/27/16 11:59 PM. Always use your most recent med list.          acetaminophen 325 MG tablet Commonly known as:  TYLENOL Take 650 mg by mouth every 6 (six) hours as needed (for pain). Notify MD if pain is not relieved. Do not exceed 3000 mg in 24 hour period   atorvastatin 40 MG tablet Commonly known as:  LIPITOR Take 1 tablet (40 mg total) by mouth daily at 6 PM.   ELIQUIS 2.5 MG Tabs tablet Generic drug:  apixaban Take 2.5 mg by mouth 2 (two) times daily.   feeding supplement (PRO-STAT SUGAR FREE 64) Liqd Take 30 mLs by mouth daily at 6 PM.   Ferrous Sulfate 220 (44 Fe) MG/5ML Liqd Take 7.5 mLs by mouth daily. 330 mg   LORazepam 0.5 MG  tablet Commonly known as:  ATIVAN Take 0.5 mg by mouth daily at 12 noon.   LORazepam 0.5 MG tablet Commonly known as:  ATIVAN Take 0.25 mg by mouth every 12 (twelve) hours as needed for anxiety.   memantine 10 MG tablet Commonly known as:  NAMENDA Take 10 mg by mouth 2 (two) times daily.   metoprolol tartrate 25 MG tablet Commonly known as:  LOPRESSOR Take 1 tablet (25 mg total) by mouth 3 (three) times daily.   mirtazapine 15 MG tablet Commonly known as:  REMERON Take 1/2 tablet ( 7.5 mg ) by mouth at bedtime   PRESERVISION AREDS 2 PO Take 1 tablet by mouth 2 (two) times daily.   promethazine 25 MG suppository Commonly known as:  PHENERGAN If unable to give suppository every 6 hours PRN x 3 doses notify MD if symptoms persist fofr more than 24 hours   senna 8.6 MG Tabs tablet Commonly known as:  SENOKOT Take 1 tablet by mouth daily.   traMADol 50 MG tablet Commonly known as:  ULTRAM Take 1 tablet (50 mg total) by mouth every 6 (six) hours as needed for moderate pain or severe pain.       No orders of the defined types were placed  in this encounter.   Immunization History  Administered Date(s) Administered  . Influenza Inj Mdck Quad Pf 03/09/2012  . Influenza Split 04/20/2009, 05/12/2010  . Influenza, Seasonal, Injecte, Preservative Fre 03/21/2013  . Influenza-Unspecified 03/22/2015  . PPD Test 05/08/2014  . Pneumococcal Polysaccharide-23 05/12/2010  . Td 01/14/2008  . Zoster 01/14/2008    Social History  Substance Use Topics  . Smoking status: Never Smoker  . Smokeless tobacco: Never Used  . Alcohol use No    Review of Systems -    Vitals:   06/27/16 1157  BP: 116/74  Pulse: 66  Resp: 16  Temp: 98.1 F (36.7 C)   Body mass index is 31.11 kg/m. Physical Exam  GENERAL APPEARANCE: Alert, mod conversant, No acute distress  SKIN: No diaphoresis rash HEENT: Unremarkable RESPIRATORY: Breathing is even, unlabored. Lung sounds are clear    CARDIOVASCULAR: Heart RRR no murmurs, rubs or gallops. No peripheral edema  GASTROINTESTINAL: Abdomen is soft, non-tender, not distended w/ normal bowel sounds.  GENITOURINARY: Bladder non tender, not distended  MUSCULOSKELETAL: No abnormal joints or musculature NEUROLOGIC: Cranial nerves 2-12 grossly intact. Moves all extremities PSYCHIATRIC: dementia, no behavioral issues  Patient Active Problem List   Diagnosis Date Noted  . Depression 04/23/2016  . Poor appetite 10/11/2015  . Loss of weight 10/11/2015  . Diarrhea 09/10/2015  . Anxiety 07/04/2015  . Mood disorder ([place]) 07/04/2015  . DVT of lower extremity (deep venous thrombosis) ([place]) 01/23/2015  . OA (osteoarthritis) of knee 09/20/2014  . CKD (chronic kidney disease) stage 3, GFR 30-59 ml/min 08/04/2014  . Anemia of chronic disease 08/04/2014  . Hyperlipidemia   . Essential hypertension 04/20/2014  . Vascular dementia with behavior disturbance 04/20/2014  . Paroxysmal atrial fibrillation ([place]) 04/20/2014  . Basal ganglia infarction ([place]) 04/17/2014  . History of breast cancer 08/23/2012    CMP     Component Value Date/Time   NA 142 05/01/2016   K 4.5 05/01/2016   CL 103 04/20/2014 0545   CO2 23 04/20/2014 0545   GLUCOSE 131 (H) 04/20/2014 0545   BUN 33 (A) 05/01/2016   CREATININE 1.3 (A) 05/01/2016   CREATININE 1.15 (H) 04/20/2014 0545   CALCIUM 8.8 04/20/2014 0545   PROT 6.6 09/04/2013 2358   ALBUMIN 3.4 (L) 09/04/2013 2358   AST 16 01/30/2016   ALT 8 01/30/2016   ALKPHOS 58 01/30/2016   BILITOT 0.5 09/04/2013 2358   GFRNONAA 42 (L) 04/20/2014 0545   GFRAA 48 (L) 04/20/2014 0545    Recent Labs  10/30/15 01/30/16 05/01/16  NA 142 145 142  K 4.4 5.0 4.5  BUN 33* 34* 33*  CREATININE 1.1 1.2* 1.3*    Recent Labs  09/12/15 10/18/15 01/30/16  AST 14 10* 16  ALT 9 5* 8  ALKPHOS 39 49 58    Recent Labs  10/12/15 01/30/16 05/01/16  WBC 7.3 8.1 6.7  HGB 12.4 12.4 9.9*  HCT 38 38 31*  PLT 252 225 201     Recent Labs  01/30/16  CHOL 112  LDLCALC 60  TRIG 130   No results found for: MICROALBUR Lab Results  Component Value Date   TSH 1.05 10/12/2015   Lab Results  Component Value Date   HGBA1C 5.7 (H) 04/18/2014   Lab Results  Component Value Date   CHOL 112 01/30/2016   HDL 26 (A) 01/30/2016   LDLCALC 60 01/30/2016   TRIG 130 01/30/2016   CHOLHDL 5.7 04/18/2014    Significant Diagnostic Results in last  30 days:  No results found.  Assessment and Plan  CKD (chronic kidney disease) stage 3, GFR 30-59 ml/min Recent BUN 33/ Cr 1.3 , stable form prior;no recent GFR; will follow at intervals  Essential hypertension Controlled ; cont metoprolol 35 mg TID  Vascular dementia with behavior disturbance Stable without major declines;cont namenda 10 mg BID    Cindy Trevino. Sheppard Coil, MD

## 2016-07-06 ENCOUNTER — Encounter: Payer: Self-pay | Admitting: Internal Medicine

## 2016-07-06 NOTE — Assessment & Plan Note (Signed)
Stable without major declines;cont namenda 10 mg BID

## 2016-07-06 NOTE — Assessment & Plan Note (Signed)
Recent BUN 33/ Cr 1.3 , stable form prior;no recent GFR; will follow at intervals

## 2016-07-06 NOTE — Assessment & Plan Note (Signed)
Controlled ; cont metoprolol 35 mg TID

## 2016-07-28 ENCOUNTER — Encounter: Payer: Self-pay | Admitting: Internal Medicine

## 2016-07-28 ENCOUNTER — Non-Acute Institutional Stay (SKILLED_NURSING_FACILITY): Payer: Medicare Other | Admitting: Internal Medicine

## 2016-07-28 DIAGNOSIS — E785 Hyperlipidemia, unspecified: Secondary | ICD-10-CM | POA: Diagnosis not present

## 2016-07-28 DIAGNOSIS — I480 Paroxysmal atrial fibrillation: Secondary | ICD-10-CM

## 2016-07-28 DIAGNOSIS — M1712 Unilateral primary osteoarthritis, left knee: Secondary | ICD-10-CM

## 2016-07-28 NOTE — Progress Notes (Signed)
Location:  [facility] Room Number: 203D Place of Service:  SNF [phone])  [name], MD  Patient Care Team: Hennie Duos, MD as PCP - General (Internal Medicine)  Extended Emergency Contact Information Primary Emergency Contact: Domingo Mend Address: [id number] [place], [place] Phone: [phone] Relation: Daughter    Allergies: Patient has no known allergies.  Chief Complaint  Patient presents with  . Medical Management of Chronic Issues    Routine Visit    HPI: Patient is 83 y.o. female who is being seen for routine issues og PAF, HLD and OA of L knee.  Past Medical History:  Diagnosis Date  . Arthritis   . COPD (chronic obstructive pulmonary disease) ([place])   . Dementia 02/02/2012  . History of breast cancer 2009   Left breast  s/p lumpectomy and rads. declined hormonal tx. last mammogram 2011  . Hyperlipidemia   . Hypertension   . SVT (supraventricular tachycardia) ([facility])     Past Surgical History:  Procedure Laterality Date  . BREAST LUMPECTOMY Left 2009   left breast  . CESAREAN SECTION    . EYE SURGERY     cataracts x3    Allergies as of 07/28/2016   No Known Allergies     Medication List       Accurate as of 07/28/16 11:59 PM. Always use your most recent med list.          acetaminophen 325 MG tablet Commonly known as:  TYLENOL Take 650 mg by mouth every 6 (six) hours as needed (for pain). Notify MD if pain is not relieved. Do not exceed 3000 mg in 24 hour period   atorvastatin 40 MG tablet Commonly known as:  LIPITOR Take 1 tablet (40 mg total) by mouth daily at 6 PM.   ELIQUIS 2.5 MG Tabs tablet Generic drug:  apixaban Take 2.5 mg by mouth 2 (two) times daily.   feeding supplement (PRO-STAT SUGAR FREE 64) Liqd Take 30 mLs by mouth daily at 6 PM.   Ferrous Sulfate 220 (44 Fe) MG/5ML Liqd Take 7.5 mLs by mouth daily. 330 mg   LORazepam 0.5 MG  tablet Commonly known as:  ATIVAN Take 0.25 mg by mouth daily at 12 noon.   memantine 10 MG tablet Commonly known as:  NAMENDA Take 10 mg by mouth 2 (two) times daily.   metoprolol tartrate 25 MG tablet Commonly known as:  LOPRESSOR Take 1 tablet (25 mg total) by mouth 3 (three) times daily.   mirtazapine 15 MG tablet Commonly known as:  REMERON Take 1/2 tablet ( 7.5 mg ) by mouth at bedtime   PRESERVISION AREDS 2 PO Take 1 tablet by mouth 2 (two) times daily.   promethazine 25 MG suppository Commonly known as:  PHENERGAN If unable to give suppository every 6 hours PRN x 3 doses notify MD if symptoms persist for more than 24 hours   senna 8.6 MG Tabs tablet Commonly known as:  SENOKOT Take 1 tablet by mouth daily.   traMADol 50 MG tablet Commonly known as:  ULTRAM Take 1 tablet (50 mg total) by mouth every 6 (six) hours as needed for moderate pain or severe pain.       No orders of the defined types were placed in this encounter.   Immunization History  Administered Date(s) Administered  . Influenza Inj Mdck Quad Pf 03/09/2012  . Influenza  Split 04/20/2009, 05/12/2010  . Influenza, Seasonal, Injecte, Preservative Fre 03/21/2013  . Influenza-Unspecified 03/22/2015  . PPD Test 05/08/2014  . Pneumococcal Polysaccharide-23 05/12/2010  . Td 01/14/2008  . Zoster 01/14/2008    Social History  Substance Use Topics  . Smoking status: Never Smoker  . Smokeless tobacco: Never Used  . Alcohol use No    Review of Systems  UTO 2/2 dementia; nursing without concerns    Vitals:   07/28/16 1102  BP: 127/80  Pulse: 76  Resp: 16  Temp: 97 F (36.1 C)   Body mass index is 31.5 kg/m. Physical Exam  GENERAL APPEARANCE: Alert, mod conversant, No acute distress ; sits in hall daily at nursing station SKIN: No diaphoresis rash HEENT: Unremarkable RESPIRATORY: Breathing is even, unlabored. Lung sounds are clear   CARDIOVASCULAR: Heart RRR no murmurs, rubs or gallops.  No peripheral edema  GASTROINTESTINAL: Abdomen is soft, non-tender, not distended w/ normal bowel sounds.  GENITOURINARY: Bladder non tender, not distended  MUSCULOSKELETAL: No abnormal joints or musculature NEUROLOGIC: Cranial nerves 2-12 grossly intact. Moves all extremities PSYCHIATRIC: dementia, no behavioral issues  Patient Active Problem List   Diagnosis Date Noted  . Depression 04/23/2016  . Poor appetite 10/11/2015  . Loss of weight 10/11/2015  . Diarrhea 09/10/2015  . Anxiety 07/04/2015  . Mood disorder ([place]) 07/04/2015  . DVT of lower extremity (deep venous thrombosis) ([place]) 01/23/2015  . OA (osteoarthritis) of knee 09/20/2014  . CKD (chronic kidney disease) stage 3, GFR 30-59 ml/min 08/04/2014  . Anemia of chronic disease 08/04/2014  . Hyperlipidemia   . Essential hypertension 04/20/2014  . Vascular dementia with behavior disturbance 04/20/2014  . Paroxysmal atrial fibrillation ([place]) 04/20/2014  . Basal ganglia infarction ([place]) 04/17/2014  . History of breast cancer 08/23/2012    CMP     Component Value Date/Time   NA 142 05/01/2016   K 4.5 05/01/2016   CL 103 04/20/2014 0545   CO2 23 04/20/2014 0545   GLUCOSE 131 (H) 04/20/2014 0545   BUN 33 (A) 05/01/2016   CREATININE 1.3 (A) 05/01/2016   CREATININE 1.15 (H) 04/20/2014 0545   CALCIUM 8.8 04/20/2014 0545   PROT 6.6 09/04/2013 2358   ALBUMIN 3.4 (L) 09/04/2013 2358   AST 16 01/30/2016   ALT 8 01/30/2016   ALKPHOS 58 01/30/2016   BILITOT 0.5 09/04/2013 2358   GFRNONAA 42 (L) 04/20/2014 0545   GFRAA 48 (L) 04/20/2014 0545    Recent Labs  10/30/15 01/30/16 05/01/16  NA 142 145 142  K 4.4 5.0 4.5  BUN 33* 34* 33*  CREATININE 1.1 1.2* 1.3*    Recent Labs  09/12/15 10/18/15 01/30/16  AST 14 10* 16  ALT 9 5* 8  ALKPHOS 39 49 58    Recent Labs  10/12/15 01/30/16 05/01/16  WBC 7.3 8.1 6.7  HGB 12.4 12.4 9.9*  HCT 38 38 31*  PLT 252 225 201    Recent Labs  01/30/16  CHOL 112  LDLCALC 60   TRIG 130   No results found for: [facility] Lab Results  Component Value Date   TSH 1.05 10/12/2015   Lab Results  Component Value Date   HGBA1C 5.7 (H) 04/18/2014   Lab Results  Component Value Date   CHOL 112 01/30/2016   HDL 26 (A) 01/30/2016   LDLCALC 60 01/30/2016   TRIG 130 01/30/2016   CHOLHDL 5.7 04/18/2014    Significant Diagnostic Results in last 30 days:  No results found.  Assessment and  Plan  Paroxysmal atrial fibrillation Stable; rate controlled on metoprolol 25 mg TID and prophylaxed with eliquis 2.5 mg BID  Hyperlipidemia LDL 60, HDL 29, controlled;plan to cont lipitor 40 mg daily  OA (osteoarthritis) of knee No reports of c/o pain or problems; has tylenol for pain as well as ultram that I am not aware she has used recently    [employer] D. Sheppard Coil,  MD

## 2016-07-31 ENCOUNTER — Non-Acute Institutional Stay (SKILLED_NURSING_FACILITY): Payer: Medicare Other | Admitting: Internal Medicine

## 2016-07-31 DIAGNOSIS — Z20828 Contact with and (suspected) exposure to other viral communicable diseases: Secondary | ICD-10-CM | POA: Diagnosis not present

## 2016-08-10 ENCOUNTER — Encounter: Payer: Self-pay | Admitting: Internal Medicine

## 2016-08-10 NOTE — Assessment & Plan Note (Signed)
No reports of c/o pain or problems; has tylenol for pain as well as ultram that I am not aware she has used recently

## 2016-08-10 NOTE — Assessment & Plan Note (Signed)
Stable; rate controlled on metoprolol 25 mg TID and prophylaxed with eliquis 2.5 mg BID

## 2016-08-10 NOTE — Assessment & Plan Note (Signed)
LDL 60, HDL 29, controlled;plan to cont lipitor 40 mg daily

## 2016-08-12 DIAGNOSIS — E119 Type 2 diabetes mellitus without complications: Secondary | ICD-10-CM | POA: Diagnosis not present

## 2016-08-12 DIAGNOSIS — I10 Essential (primary) hypertension: Secondary | ICD-10-CM | POA: Diagnosis not present

## 2016-08-12 DIAGNOSIS — E559 Vitamin D deficiency, unspecified: Secondary | ICD-10-CM | POA: Diagnosis not present

## 2016-08-12 LAB — TSH: TSH: 4.09 u[IU]/mL (ref 0.41–5.90)

## 2016-08-12 LAB — HEPATIC FUNCTION PANEL
ALT: 10 U/L (ref 7–35)
AST: 14 U/L (ref 13–35)
Alkaline Phosphatase: 64 U/L (ref 25–125)
Bilirubin, Total: 0.3 mg/dL

## 2016-08-12 LAB — CBC AND DIFFERENTIAL
HCT: 36 % (ref 36–46)
Hemoglobin: 11.4 g/dL — AB (ref 12.0–16.0)
Platelets: 225 10*3/uL (ref 150–399)
WBC: 7.8 10*3/mL

## 2016-08-12 LAB — LIPID PANEL
CHOLESTEROL: 89 mg/dL (ref 0–200)
HDL: 24 mg/dL — AB (ref 35–70)
LDL CALC: 49 mg/dL
Triglycerides: 108 mg/dL (ref 40–160)

## 2016-08-12 LAB — HEMOGLOBIN A1C: Hemoglobin A1C: 6.1

## 2016-08-12 LAB — BASIC METABOLIC PANEL
BUN: 37 mg/dL — AB (ref 4–21)
Creatinine: 1.2 mg/dL — AB (ref 0.5–1.1)
GLUCOSE: 87 mg/dL
Potassium: 4.5 mmol/L (ref 3.4–5.3)
Sodium: 145 mmol/L (ref 137–147)

## 2016-08-12 LAB — VITAMIN D 25 HYDROXY (VIT D DEFICIENCY, FRACTURES): VIT D 25 HYDROXY: 34.83

## 2016-08-21 DIAGNOSIS — F0390 Unspecified dementia without behavioral disturbance: Secondary | ICD-10-CM | POA: Diagnosis not present

## 2016-08-21 DIAGNOSIS — F39 Unspecified mood [affective] disorder: Secondary | ICD-10-CM | POA: Diagnosis not present

## 2016-08-21 DIAGNOSIS — F419 Anxiety disorder, unspecified: Secondary | ICD-10-CM | POA: Diagnosis not present

## 2016-08-25 ENCOUNTER — Non-Acute Institutional Stay (SKILLED_NURSING_FACILITY): Payer: Medicare Other | Admitting: Internal Medicine

## 2016-08-25 ENCOUNTER — Encounter: Payer: Self-pay | Admitting: Internal Medicine

## 2016-08-25 DIAGNOSIS — H3530 Unspecified macular degeneration: Secondary | ICD-10-CM | POA: Diagnosis not present

## 2016-08-25 DIAGNOSIS — F32A Depression, unspecified: Secondary | ICD-10-CM

## 2016-08-25 DIAGNOSIS — F419 Anxiety disorder, unspecified: Secondary | ICD-10-CM

## 2016-08-25 DIAGNOSIS — F329 Major depressive disorder, single episode, unspecified: Secondary | ICD-10-CM

## 2016-08-25 NOTE — Progress Notes (Signed)
Location:  [facility] Room Number: 203D Place of Service:  SNF [phone])  [name], MD  Patient Care Team: Hennie Duos, MD as PCP - General (Internal Medicine)  Extended Emergency Contact Information Primary Emergency Contact: Domingo Mend Address: [id number] [place], [place] Phone: [phone] Relation: Daughter    Allergies: Patient has no known allergies.  Chief Complaint  Patient presents with  . Medical Management of Chronic Issues    Routine Visit    HPI: Patient is 83 y.o. female who is being seen for routine issues of anxiety, depression and macular degeneration.  Past Medical History:  Diagnosis Date  . Arthritis   . COPD (chronic obstructive pulmonary disease) ([place])   . Dementia 02/02/2012  . History of breast cancer 2009   Left breast  s/p lumpectomy and rads. declined hormonal tx. last mammogram 2011  . Hyperlipidemia   . Hypertension   . SVT (supraventricular tachycardia) ([facility])     Past Surgical History:  Procedure Laterality Date  . BREAST LUMPECTOMY Left 2009   left breast  . CESAREAN SECTION    . EYE SURGERY     cataracts x3    Allergies as of 08/25/2016   No Known Allergies     Medication List       Accurate as of 08/25/16 11:59 PM. Always use your most recent med list.          acetaminophen 325 MG tablet Commonly known as:  TYLENOL Take 650 mg by mouth every 6 (six) hours as needed (for pain). Notify MD if pain is not relieved. Do not exceed 3000 mg in 24 hour period   atorvastatin 40 MG tablet Commonly known as:  LIPITOR Take 1 tablet (40 mg total) by mouth daily at 6 PM.   ELIQUIS 2.5 MG Tabs tablet Generic drug:  apixaban Take 2.5 mg by mouth 2 (two) times daily.   feeding supplement (PRO-STAT SUGAR FREE 64) Liqd Take 30 mLs by mouth daily at 6 PM.   Ferrous Sulfate 220 (44 Fe) MG/5ML Liqd Take 7.5 mLs by mouth daily. 330 mg   LORazepam 0.5  MG tablet Commonly known as:  ATIVAN Take 0.25 mg by mouth daily at 12 noon.   memantine 10 MG tablet Commonly known as:  NAMENDA Take 10 mg by mouth 2 (two) times daily.   metoprolol tartrate 25 MG tablet Commonly known as:  LOPRESSOR Take 1 tablet (25 mg total) by mouth 3 (three) times daily.   mirtazapine 15 MG tablet Commonly known as:  REMERON Take 1/2 tablet ( 7.5 mg ) by mouth at bedtime   PRESERVISION AREDS 2 PO Take 1 tablet by mouth 2 (two) times daily.   promethazine 25 MG suppository Commonly known as:  PHENERGAN If unable to give suppository every 6 hours PRN x 3 doses notify MD if symptoms persist for more than 24 hours   senna 8.6 MG Tabs tablet Commonly known as:  SENOKOT Take 1 tablet by mouth daily.   traMADol 50 MG tablet Commonly known as:  ULTRAM Take 1 tablet (50 mg total) by mouth every 6 (six) hours as needed for moderate pain or severe pain.       No orders of the defined types were placed in this encounter.   Immunization History  Administered Date(s) Administered  . Influenza Inj Mdck Quad Pf 03/09/2012  . Influenza Split 04/20/2009,  05/12/2010  . Influenza, Seasonal, Injecte, Preservative Fre 03/21/2013  . Influenza-Unspecified 03/22/2015  . PPD Test 05/08/2014  . Pneumococcal Polysaccharide-23 05/12/2010  . Td 01/14/2008  . Zoster 01/14/2008    Social History  Substance Use Topics  . Smoking status: Never Smoker  . Smokeless tobacco: Never Used  . Alcohol use No    Review of Systems  UTO 2/2/ dementia; nursing without concerns   Vitals:   08/25/16 1335  BP: 127/80  Pulse: 73  Resp: 16  Temp: 97 F (36.1 C)   Body mass index is 31.5 kg/m. Physical Exam  GENERAL APPEARANCE: Alert, mod conversant, No acute distress  SKIN: No diaphoresis rash HEENT: Unremarkable RESPIRATORY: Breathing is even, unlabored. Lung sounds are clear   CARDIOVASCULAR: Heart RRR no murmurs, rubs or gallops. No peripheral edema    GASTROINTESTINAL: Abdomen is soft, non-tender, not distended w/ normal bowel sounds.  GENITOURINARY: Bladder non tender, not distended  MUSCULOSKELETAL: No abnormal joints or musculature NEUROLOGIC: Cranial nerves 2-12 grossly intact. Moves all extremities PSYCHIATRIC: dementia, no behavioral issues  Patient Active Problem List   Diagnosis Date Noted  . Macular degeneration 09/06/2016  . Depression 04/23/2016  . Poor appetite 10/11/2015  . Loss of weight 10/11/2015  . Diarrhea 09/10/2015  . Anxiety 07/04/2015  . Mood disorder ([place]) 07/04/2015  . DVT of lower extremity (deep venous thrombosis) ([place]) 01/23/2015  . OA (osteoarthritis) of knee 09/20/2014  . CKD (chronic kidney disease) stage 3, GFR 30-59 ml/min 08/04/2014  . Anemia of chronic disease 08/04/2014  . Hyperlipidemia   . Essential hypertension 04/20/2014  . Vascular dementia with behavior disturbance 04/20/2014  . Paroxysmal atrial fibrillation ([place]) 04/20/2014  . Basal ganglia infarction ([place]) 04/17/2014  . History of breast cancer 08/23/2012    CMP     Component Value Date/Time   NA 145 08/12/2016   K 4.5 08/12/2016   CL 103 04/20/2014 0545   CO2 23 04/20/2014 0545   GLUCOSE 131 (H) 04/20/2014 0545   BUN 37 (A) 08/12/2016   CREATININE 1.2 (A) 08/12/2016   CREATININE 1.15 (H) 04/20/2014 0545   CALCIUM 8.8 04/20/2014 0545   PROT 6.6 09/04/2013 2358   ALBUMIN 3.4 (L) 09/04/2013 2358   AST 14 08/12/2016   ALT 10 08/12/2016   ALKPHOS 64 08/12/2016   BILITOT 0.5 09/04/2013 2358   GFRNONAA 42 (L) 04/20/2014 0545   GFRAA 48 (L) 04/20/2014 0545    Recent Labs  01/30/16 05/01/16 08/12/16  NA 145 142 145  K 5.0 4.5 4.5  BUN 34* 33* 37*  CREATININE 1.2* 1.3* 1.2*    Recent Labs  10/18/15 01/30/16 08/12/16  AST 10* 16 14  ALT 5* 8 10  ALKPHOS 49 58 64    Recent Labs  01/30/16 05/01/16 08/12/16  WBC 8.1 6.7 7.8  HGB 12.4 9.9* 11.4*  HCT 38 31* 36  PLT 225 201 225    Recent Labs  01/30/16 08/12/16   CHOL 112 89  LDLCALC 60 49  TRIG 130 108   No results found for: MICROALBUR Lab Results  Component Value Date   TSH 4.09 08/12/2016   Lab Results  Component Value Date   HGBA1C 6.1 08/12/2016   Lab Results  Component Value Date   CHOL 89 08/12/2016   HDL 24 (A) 08/12/2016   LDLCALC 49 08/12/2016   TRIG 108 08/12/2016   CHOLHDL 5.7 04/18/2014    Significant Diagnostic Results in last 30 days:  No results found.  Assessment and Plan  Anxiety Cont ativan 0.25 mg q noon daily, this is when it helps her the most  Depression Chronic ,stable; cont remeron 15 mg daily  Macular degeneration Continue preserv areds BID    Cindy Delaine. Sheppard Coil, MD

## 2016-08-26 DIAGNOSIS — H26493 Other secondary cataract, bilateral: Secondary | ICD-10-CM | POA: Diagnosis not present

## 2016-08-26 DIAGNOSIS — Z961 Presence of intraocular lens: Secondary | ICD-10-CM | POA: Diagnosis not present

## 2016-08-26 DIAGNOSIS — H353221 Exudative age-related macular degeneration, left eye, with active choroidal neovascularization: Secondary | ICD-10-CM | POA: Diagnosis not present

## 2016-08-26 DIAGNOSIS — H353111 Nonexudative age-related macular degeneration, right eye, early dry stage: Secondary | ICD-10-CM | POA: Diagnosis not present

## 2016-09-06 ENCOUNTER — Encounter: Payer: Self-pay | Admitting: Internal Medicine

## 2016-09-06 DIAGNOSIS — H3530 Unspecified macular degeneration: Secondary | ICD-10-CM | POA: Insufficient documentation

## 2016-09-06 NOTE — Assessment & Plan Note (Signed)
Chronic ,stable; cont remeron 15 mg daily

## 2016-09-06 NOTE — Assessment & Plan Note (Signed)
Cont ativan 0.25 mg q noon daily, this is when it helps her the most

## 2016-09-06 NOTE — Assessment & Plan Note (Signed)
Continue preserv areds BID

## 2016-09-10 ENCOUNTER — Encounter: Payer: Self-pay | Admitting: Internal Medicine

## 2016-09-10 NOTE — Progress Notes (Signed)
Location:  [facility] Room Number: 203D Place of Service:  SNF [phone])  [name], MD  Patient Care Team: Hennie Duos, MD as PCP - General (Internal Medicine)  Extended Emergency Contact Information Primary Emergency Contact: Domingo Mend Address: [address], [place] Phone: [phone] Relation: Daughter    Allergies: Patient has no known allergies.  Chief Complaint  Patient presents with  . Acute Visit    HPI: Patient is 83 y.o. female who is being seen acutely because an outbreak of Influenza A per CDC guidelines was recognized on 07/30/2016. Pt has no c/o flu like symptoms;therefore pt will need to be prophylaxed with Tamiflu for a minimum of 14 days per CDC protocol.  Past Medical History:  Diagnosis Date  . Arthritis   . COPD (chronic obstructive pulmonary disease) ([facility])   . Dementia 02/02/2012  . History of breast cancer 2009   Left breast  s/p lumpectomy and rads. declined hormonal tx. last mammogram 2011  . Hyperlipidemia   . Hypertension   . SVT (supraventricular tachycardia) (H[facility])     Past Surgical History:  Procedure Laterality Date  . BREAST LUMPECTOMY Left 2009   left breast  . CESAREAN SECTION    . EYE SURGERY     cataracts x3    Allergies as of 07/31/2016   No Known Allergies     Medication List       Accurate as of 07/31/16 11:59 PM. Always use your most recent med list.          acetaminophen 325 MG tablet Commonly known as:  TYLENOL Take 650 mg by mouth every 6 (six) hours as needed (for pain). Notify MD if pain is not relieved. Do not exceed 3000 mg in 24 hour period   atorvastatin 40 MG tablet Commonly known as:  LIPITOR Take 1 tablet (40 mg total) by mouth daily at 6 PM.   ELIQUIS 2.5 MG Tabs tablet Generic drug:  apixaban Take 2.5 mg by mouth 2 (two) times daily.   feeding supplement (PRO-STAT SUGAR FREE 64) Liqd Take 30 mLs by mouth daily  at 6 PM.   Ferrous Sulfate 220 (44 Fe) MG/5ML Liqd Take 7.5 mLs by mouth daily. 330 mg   LORazepam 0.5 MG tablet Commonly known as:  ATIVAN Take 0.25 mg by mouth daily at 12 noon.   memantine 10 MG tablet Commonly known as:  NAMENDA Take 10 mg by mouth 2 (two) times daily.   metoprolol tartrate 25 MG tablet Commonly known as:  LOPRESSOR Take 1 tablet (25 mg total) by mouth 3 (three) times daily.   mirtazapine 15 MG tablet Commonly known as:  REMERON Take 1/2 tablet ( 7.5 mg ) by mouth at bedtime   PRESERVISION AREDS 2 PO Take 1 tablet by mouth 2 (two) times daily.   promethazine 25 MG suppository Commonly known as:  PHENERGAN If unable to give suppository every 6 hours PRN x 3 doses notify MD if symptoms persist for more than 24 hours   Propylene Glycol 0.6 % Soln Place 1 drop into both eyes 2 (two) times daily.   senna 8.6 MG Tabs tablet Commonly known as:  SENOKOT Take 1 tablet by mouth daily.   traMADol 50 MG tablet Commonly known as:  ULTRAM Take 1 tablet (50 mg total) by mouth every 6 (six) hours as needed for moderate pain or severe pain.  No orders of the defined types were placed in this encounter.   Immunization History  Administered Date(s) Administered  . Influenza Inj Mdck Quad Pf 03/09/2012  . Influenza Split 04/20/2009, 05/12/2010  . Influenza, Seasonal, Injecte, Preservative Fre 03/21/2013  . Influenza-Unspecified 03/22/2015  . PPD Test 05/08/2014  . Pneumococcal Polysaccharide-23 05/12/2010  . Td 01/14/2008  . Zoster 01/14/2008    Social History  Substance Use Topics  . Smoking status: Never Smoker  . Smokeless tobacco: Never Used  . Alcohol use No    Review of Systems  DATA OBTAINED: from  nurse GENERAL:  no fevers SKIN: No itching, rash HEENT: no rhinorrhea, congestion, ST or ear pain RESPIRATORY: No cough, wheezing, SOB CARDIAC: No chest pain, palpitations, lower extremity edema  GI: No abdominal pain, No N/V/D or  constipation, No heartburn or reflux  MUSCULOSKELETAL: No muscle aches NEUROLOGIC: No headache, dizziness   Vitals:   07/31/16 1353  BP: 127/80  Pulse: 74  Resp: 16  Temp: 97 F (36.1 C)   Body mass index is 31.5 kg/m. Physical Exam  GENERAL APPEARANCE: Alert, No acute distress  SKIN: No diaphoresis rash HEENT: Unremarkable RESPIRATORY: Breathing is even, unlabored. Lung sounds are clear   CARDIOVASCULAR: Heart RRR no murmurs, rubs or gallops. No peripheral edema  GASTROINTESTINAL: Abdomen is soft, non-tender, not distended w/ normal bowel sounds.   NEUROLOGIC: Cranial nerves 2-12 grossly intact PSYCHIATRIC: baseline, no mental status changes  Patient Active Problem List   Diagnosis Date Noted  . Macular degeneration 09/06/2016  . Depression 04/23/2016  . Poor appetite 10/11/2015  . Loss of weight 10/11/2015  . Diarrhea 09/10/2015  . Anxiety 07/04/2015  . Mood disorder ([place]) 07/04/2015  . DVT of lower extremity (deep venous thrombosis) ([place]) 01/23/2015  . OA (osteoarthritis) of knee 09/20/2014  . CKD (chronic kidney disease) stage 3, GFR 30-59 ml/min 08/04/2014  . Anemia of chronic disease 08/04/2014  . Hyperlipidemia   . Essential hypertension 04/20/2014  . Vascular dementia with behavior disturbance 04/20/2014  . Paroxysmal atrial fibrillation ([place]) 04/20/2014  . Basal ganglia infarction ([place]) 04/17/2014  . History of breast cancer 08/23/2012    CMP     Component Value Date/Time   NA 145 08/12/2016   K 4.5 08/12/2016   CL 103 04/20/2014 0545   CO2 23 04/20/2014 0545   GLUCOSE 131 (H) 04/20/2014 0545   BUN 37 (A) 08/12/2016   CREATININE 1.2 (A) 08/12/2016   CREATININE 1.15 (H) 04/20/2014 0545   CALCIUM 8.8 04/20/2014 0545   PROT 6.6 09/04/2013 2358   ALBUMIN 3.4 (L) 09/04/2013 2358   AST 14 08/12/2016   ALT 10 08/12/2016   ALKPHOS 64 08/12/2016   BILITOT 0.5 09/04/2013 2358   GFRNONAA 42 (L) 04/20/2014 0545   GFRAA 48 (L) 04/20/2014 0545    Recent  Labs  01/30/16 05/01/16 08/12/16  NA 145 142 145  K 5.0 4.5 4.5  BUN 34* 33* 37*  CREATININE 1.2* 1.3* 1.2*    Recent Labs  10/18/15 01/30/16 08/12/16  AST 10* 16 14  ALT 5* 8 10  ALKPHOS 49 58 64    Recent Labs  01/30/16 05/01/16 08/12/16  WBC 8.1 6.7 7.8  HGB 12.4 9.9* 11.4*  HCT 38 31* 36  PLT 225 201 225    Recent Labs  01/30/16 08/12/16  CHOL 112 89  LDLCALC 60 49  TRIG 130 108   No results found for: [facility] Lab Results  Component Value Date   TSH 4.09 08/12/2016  Lab Results  Component Value Date   HGBA1C 6.1 08/12/2016   Lab Results  Component Value Date   CHOL 89 08/12/2016   HDL 24 (A) 08/12/2016   LDLCALC 49 08/12/2016   TRIG 108 08/12/2016   CHOLHDL 5.7 04/18/2014    Significant Diagnostic Results in last 30 days:  No results found.  Assessment and Plan  EXPOSURE TO FLU/ INFLUENZA OUTBREAK AT SNF-   CrCl calculated by me-  38      Dose for 14 days-  30 mg daily Pt will be monitored daily for flu like symptoms                                                                                 Webb Silversmith D. Sheppard Coil, MD

## 2016-09-17 DIAGNOSIS — I739 Peripheral vascular disease, unspecified: Secondary | ICD-10-CM | POA: Diagnosis not present

## 2016-09-17 DIAGNOSIS — B351 Tinea unguium: Secondary | ICD-10-CM | POA: Diagnosis not present

## 2016-09-20 ENCOUNTER — Encounter: Payer: Self-pay | Admitting: Internal Medicine

## 2016-09-22 ENCOUNTER — Non-Acute Institutional Stay (SKILLED_NURSING_FACILITY): Payer: Medicare Other | Admitting: Internal Medicine

## 2016-09-22 ENCOUNTER — Encounter: Payer: Self-pay | Admitting: Internal Medicine

## 2016-09-22 DIAGNOSIS — I10 Essential (primary) hypertension: Secondary | ICD-10-CM | POA: Diagnosis not present

## 2016-09-22 DIAGNOSIS — F01518 Vascular dementia, unspecified severity, with other behavioral disturbance: Secondary | ICD-10-CM

## 2016-09-22 DIAGNOSIS — F419 Anxiety disorder, unspecified: Secondary | ICD-10-CM | POA: Diagnosis not present

## 2016-09-22 DIAGNOSIS — F0390 Unspecified dementia without behavioral disturbance: Secondary | ICD-10-CM | POA: Diagnosis not present

## 2016-09-22 DIAGNOSIS — F39 Unspecified mood [affective] disorder: Secondary | ICD-10-CM | POA: Diagnosis not present

## 2016-09-22 DIAGNOSIS — F0151 Vascular dementia with behavioral disturbance: Secondary | ICD-10-CM | POA: Diagnosis not present

## 2016-09-22 NOTE — Progress Notes (Signed)
Location:  [facility] Room Number: 203D Place of Service:  SNF [phone])  [name], MD  Patient Care Team: Hennie Duos, MD as PCP - General (Internal Medicine)  Extended Emergency Contact Information Primary Emergency Contact: Domingo Mend Address: [address], [place] Phone: [phone] Relation: Daughter    Allergies: Patient has no known allergies.  Chief Complaint  Patient presents with  . Medical Management of Chronic Issues    Routine Visit    HPI: Patient is 83 y.o. female who is being seen for routine issues of dementia, mood disorder and HTN.  Past Medical History:  Diagnosis Date  . Arthritis   . COPD (chronic obstructive pulmonary disease) ([place])   . Dementia 02/02/2012  . History of breast cancer 2009   Left breast  s/p lumpectomy and rads. declined hormonal tx. last mammogram 2011  . Hyperlipidemia   . Hypertension   . SVT (supraventricular tachycardia) ([facility])     Past Surgical History:  Procedure Laterality Date  . BREAST LUMPECTOMY Left 2009   left breast  . CESAREAN SECTION    . EYE SURGERY     cataracts x3    Allergies as of 09/22/2016   No Known Allergies     Medication List       Accurate as of 09/22/16 11:59 PM. Always use your most recent med list.          acetaminophen 325 MG tablet Commonly known as:  TYLENOL Take 650 mg by mouth every 6 (six) hours as needed (for pain). Notify MD if pain is not relieved. Do not exceed 3000 mg in 24 hour period   atorvastatin 40 MG tablet Commonly known as:  LIPITOR Take 1 tablet (40 mg total) by mouth daily at 6 PM.   ELIQUIS 2.5 MG Tabs tablet Generic drug:  apixaban Take 2.5 mg by mouth 2 (two) times daily.   feeding supplement (PRO-STAT SUGAR FREE 64) Liqd Take 30 mLs by mouth daily at 6 PM.   Ferrous Sulfate 220 (44 Fe) MG/5ML Liqd Take 7.5 mLs by mouth daily. 330 mg   LORazepam 0.5 MG  tablet Commonly known as:  ATIVAN Take 0.25 mg by mouth daily at 12 noon.   memantine 10 MG tablet Commonly known as:  NAMENDA Take 10 mg by mouth 2 (two) times daily.   metoprolol tartrate 25 MG tablet Commonly known as:  LOPRESSOR Take 1 tablet (25 mg total) by mouth 3 (three) times daily.   mirtazapine 15 MG tablet Commonly known as:  REMERON Take 1/2 tablet ( 7.5 mg ) by mouth at bedtime   PRESERVISION AREDS 2 PO Take 1 tablet by mouth 2 (two) times daily.   promethazine 25 MG suppository Commonly known as:  PHENERGAN If unable to give suppository every 6 hours PRN x 3 doses notify MD if symptoms persist for more than 24 hours   Propylene Glycol 0.6 % Soln Place 1 drop into both eyes 2 (two) times daily.   senna 8.6 MG Tabs tablet Commonly known as:  SENOKOT Take 1 tablet by mouth daily.   traMADol 50 MG tablet Commonly known as:  ULTRAM Take 1 tablet (50 mg total) by mouth every 6 (six) hours as needed for moderate pain or severe pain.       No orders of the defined types were placed in this encounter.   Immunization History  Administered Date(s) Administered  . Influenza Inj Mdck Quad Pf 03/09/2012  . Influenza Split 04/20/2009, 05/12/2010  . Influenza, Seasonal, Injecte, Preservative Fre 03/21/2013  . Influenza-Unspecified 03/22/2015  . PPD Test 05/08/2014  . Pneumococcal Polysaccharide-23 05/12/2010  . Td 01/14/2008  . Zoster 01/14/2008    Social History  Substance Use Topics  . Smoking status: Never Smoker  . Smokeless tobacco: Never Used  . Alcohol use No    Review of Systems  UTO 2/2 dementia     Vitals:   09/22/16 1137  BP: 127/80  Pulse: 67  Resp: 16  Temp: 97 F (36.1 C)   Body mass index is 30.8 kg/m. Physical Exam  GENERAL APPEARANCE: Alert, mod conversant, No acute distress  SKIN: No diaphoresis rash HEENT: Unremarkable RESPIRATORY: Breathing is even, unlabored. Lung sounds are clear   CARDIOVASCULAR: Heart RRR no  murmurs, rubs or gallops. No peripheral edema  GASTROINTESTINAL: Abdomen is soft, non-tender, not distended w/ normal bowel sounds.  GENITOURINARY: Bladder non tender, not distended  MUSCULOSKELETAL: No abnormal joints or musculature NEUROLOGIC: Cranial nerves 2-12 grossly intact. Moves all extremities PSYCHIATRIC: dementia, no behavioral issues  Patient Active Problem List   Diagnosis Date Noted  . Macular degeneration 09/06/2016  . Depression 04/23/2016  . Poor appetite 10/11/2015  . Loss of weight 10/11/2015  . Diarrhea 09/10/2015  . Anxiety 07/04/2015  . Mood disorder ([place]) 07/04/2015  . DVT of lower extremity (deep venous thrombosis) ([place]) 01/23/2015  . OA (osteoarthritis) of knee 09/20/2014  . CKD (chronic kidney disease) stage 3, GFR 30-59 ml/min 08/04/2014  . Anemia of chronic disease 08/04/2014  . Hyperlipidemia   . Essential hypertension 04/20/2014  . Vascular dementia with behavior disturbance 04/20/2014  . Paroxysmal atrial fibrillation ([place]) 04/20/2014  . Basal ganglia infarction ([place]) 04/17/2014  . History of breast cancer 08/23/2012    CMP     Component Value Date/Time   NA 145 08/12/2016   K 4.5 08/12/2016   CL 103 04/20/2014 0545   CO2 23 04/20/2014 0545   GLUCOSE 131 (H) 04/20/2014 0545   BUN 37 (A) 08/12/2016   CREATININE 1.2 (A) 08/12/2016   CREATININE 1.15 (H) 04/20/2014 0545   CALCIUM 8.8 04/20/2014 0545   PROT 6.6 09/04/2013 2358   ALBUMIN 3.4 (L) 09/04/2013 2358   AST 14 08/12/2016   ALT 10 08/12/2016   ALKPHOS 64 08/12/2016   BILITOT 0.5 09/04/2013 2358   GFRNONAA 42 (L) 04/20/2014 0545   GFRAA 48 (L) 04/20/2014 0545    Recent Labs  01/30/16 05/01/16 08/12/16  NA 145 142 145  K 5.0 4.5 4.5  BUN 34* 33* 37*  CREATININE 1.2* 1.3* 1.2*    Recent Labs  01/30/16 08/12/16  AST 16 14  ALT 8 10  ALKPHOS 58 64    Recent Labs  01/30/16 05/01/16 08/12/16  WBC 8.1 6.7 7.8  HGB 12.4 9.9* 11.4*  HCT 38 31* 36  PLT 225 201 225     Recent Labs  01/30/16 08/12/16  CHOL 112 89  LDLCALC 60 49  TRIG 130 108   No results found for: [facility] Lab Results  Component Value Date   TSH 4.09 08/12/2016   Lab Results  Component Value Date   HGBA1C 6.1 08/12/2016   Lab Results  Component Value Date   CHOL 89 08/12/2016   HDL 24 (A) 08/12/2016   LDLCALC 49 08/12/2016   TRIG 108 08/12/2016   CHOLHDL 5.7 04/18/2014    Significant Diagnostic Results in last  30 days:  No results found.  Assessment and Plan  Vascular dementia with behavior disturbance Stable with predicted slow steady decline; plan to cont namenda 10 mg BID  Mood disorder (HCC) Controlled ; plan to cont depakote 125 mg qHS  Essential hypertension Stable;plan to cont metoprolol 25 mg TID     Noah Delaine. Sheppard Coil, MD

## 2016-09-25 ENCOUNTER — Other Ambulatory Visit: Payer: Self-pay

## 2016-09-25 MED ORDER — LORAZEPAM 0.5 MG PO TABS: 0.2500 mg | ORAL_TABLET | Freq: Every day | ORAL | 0 refills | Status: DC

## 2016-09-25 NOTE — Telephone Encounter (Signed)
RX faxed to [facility] at [phone], phone number [phone]  

## 2016-10-06 DIAGNOSIS — H35433 Paving stone degeneration of retina, bilateral: Secondary | ICD-10-CM | POA: Diagnosis not present

## 2016-10-06 DIAGNOSIS — H353221 Exudative age-related macular degeneration, left eye, with active choroidal neovascularization: Secondary | ICD-10-CM | POA: Diagnosis not present

## 2016-10-06 DIAGNOSIS — H353112 Nonexudative age-related macular degeneration, right eye, intermediate dry stage: Secondary | ICD-10-CM | POA: Diagnosis not present

## 2016-10-17 DIAGNOSIS — R1312 Dysphagia, oropharyngeal phase: Secondary | ICD-10-CM | POA: Diagnosis not present

## 2016-10-17 DIAGNOSIS — F0150 Vascular dementia without behavioral disturbance: Secondary | ICD-10-CM | POA: Diagnosis not present

## 2016-10-18 DIAGNOSIS — F0150 Vascular dementia without behavioral disturbance: Secondary | ICD-10-CM | POA: Diagnosis not present

## 2016-10-18 DIAGNOSIS — R1312 Dysphagia, oropharyngeal phase: Secondary | ICD-10-CM | POA: Diagnosis not present

## 2016-10-19 ENCOUNTER — Encounter: Payer: Self-pay | Admitting: Internal Medicine

## 2016-10-19 NOTE — Assessment & Plan Note (Signed)
Stable;plan to cont metoprolol 25 mg TID

## 2016-10-19 NOTE — Assessment & Plan Note (Signed)
Controlled ; plan to cont depakote 125 mg qHS

## 2016-10-19 NOTE — Assessment & Plan Note (Signed)
Stable with predicted slow steady decline; plan to cont namenda 10 mg BID

## 2016-10-20 DIAGNOSIS — F0390 Unspecified dementia without behavioral disturbance: Secondary | ICD-10-CM | POA: Diagnosis not present

## 2016-10-20 DIAGNOSIS — F39 Unspecified mood [affective] disorder: Secondary | ICD-10-CM | POA: Diagnosis not present

## 2016-10-20 DIAGNOSIS — F419 Anxiety disorder, unspecified: Secondary | ICD-10-CM | POA: Diagnosis not present

## 2016-10-20 DIAGNOSIS — R1312 Dysphagia, oropharyngeal phase: Secondary | ICD-10-CM | POA: Diagnosis not present

## 2016-10-20 DIAGNOSIS — F0150 Vascular dementia without behavioral disturbance: Secondary | ICD-10-CM | POA: Diagnosis not present

## 2016-10-21 DIAGNOSIS — F0150 Vascular dementia without behavioral disturbance: Secondary | ICD-10-CM | POA: Diagnosis not present

## 2016-10-21 DIAGNOSIS — R1312 Dysphagia, oropharyngeal phase: Secondary | ICD-10-CM | POA: Diagnosis not present

## 2016-10-22 ENCOUNTER — Encounter: Payer: Self-pay | Admitting: Internal Medicine

## 2016-10-22 ENCOUNTER — Non-Acute Institutional Stay (SKILLED_NURSING_FACILITY): Payer: Medicare Other | Admitting: Internal Medicine

## 2016-10-22 DIAGNOSIS — M1712 Unilateral primary osteoarthritis, left knee: Secondary | ICD-10-CM | POA: Diagnosis not present

## 2016-10-22 DIAGNOSIS — F0150 Vascular dementia without behavioral disturbance: Secondary | ICD-10-CM | POA: Diagnosis not present

## 2016-10-22 DIAGNOSIS — N183 Chronic kidney disease, stage 3 unspecified: Secondary | ICD-10-CM

## 2016-10-22 DIAGNOSIS — I480 Paroxysmal atrial fibrillation: Secondary | ICD-10-CM | POA: Diagnosis not present

## 2016-10-22 DIAGNOSIS — R1312 Dysphagia, oropharyngeal phase: Secondary | ICD-10-CM | POA: Diagnosis not present

## 2016-10-22 NOTE — Progress Notes (Signed)
Location:  [facility] [place] Room Number: 203D Place of Service:  SNF (31)  Cindy Duos, MD  Patient Care Team: Cindy Duos, MD as PCP - General (Internal Medicine)  Extended Emergency Contact Information Primary Emergency Contact: Domingo Mend Address: [address], [place] Phone: [phone] Relation: Daughter    Allergies: Patient has no known allergies.  Chief Complaint  Patient presents with  . Medical Management of Chronic Issues    Routine Visit    HPI: Patient is 83 y.o. female who is being seen for routine issues of PAF, OA and CKD3.  Past Medical History:  Diagnosis Date  . Arthritis   . COPD (chronic obstructive pulmonary disease) ([place])   . Dementia 02/02/2012  . History of breast cancer 2009   Left breast  s/p lumpectomy and rads. declined hormonal tx. last mammogram 2011  . Hyperlipidemia   . Hypertension   . SVT (supraventricular tachycardia) ([facility])     Past Surgical History:  Procedure Laterality Date  . BREAST LUMPECTOMY Left 2009   left breast  . CESAREAN SECTION    . EYE SURGERY     cataracts x3    Allergies as of 10/22/2016   No Known Allergies     Medication List       Accurate as of 10/22/16 11:59 PM. Always use your most recent med list.          acetaminophen 325 MG tablet Commonly known as:  TYLENOL Take 650 mg by mouth every 6 (six) hours as needed (for pain). Notify MD if pain is not relieved. Do not exceed 3000 mg in 24 hour period   atorvastatin 40 MG tablet Commonly known as:  LIPITOR Take 1 tablet (40 mg total) by mouth daily at 6 PM.   ELIQUIS 2.5 MG Tabs tablet Generic drug:  apixaban Take 2.5 mg by mouth 2 (two) times daily.   feeding supplement (PRO-STAT SUGAR FREE 64) Liqd Take 30 mLs by mouth daily at 6 PM.   Ferrous Sulfate 220 (44 Fe) MG/5ML Liqd Take 7.5 mLs by mouth daily. 330 mg   LORazepam 0.5 MG tablet Commonly  known as:  ATIVAN Take 0.5 tablets (0.25 mg total) by mouth daily at 12 noon.   memantine 10 MG tablet Commonly known as:  NAMENDA Take 10 mg by mouth 2 (two) times daily.   metoprolol tartrate 25 MG tablet Commonly known as:  LOPRESSOR Take 1 tablet (25 mg total) by mouth 3 (three) times daily.   mirtazapine 15 MG tablet Commonly known as:  REMERON Take 1/2 tablet ( 7.5 mg ) by mouth at bedtime   PRESERVISION AREDS 2 PO Take 1 tablet by mouth 2 (two) times daily.   promethazine 25 MG suppository Commonly known as:  PHENERGAN If unable to give suppository every 6 hours PRN x 3 doses notify MD if symptoms persist for more than 24 hours   Propylene Glycol 0.6 % Soln Place 1 drop into both eyes 2 (two) times daily.   senna 8.6 MG Tabs tablet Commonly known as:  SENOKOT Take 1 tablet by mouth daily.   traMADol 50 MG tablet Commonly known as:  ULTRAM Take 1 tablet (50 mg total) by mouth every 6 (six) hours as needed for moderate pain or severe pain.       No orders of the defined types were placed in this encounter.  Immunization History  Administered Date(s) Administered  . Influenza Inj Mdck Quad Pf 03/09/2012  . Influenza Split 04/20/2009, 05/12/2010  . Influenza, Seasonal, Injecte, Preservative Fre 03/21/2013  . Influenza-Unspecified 03/22/2015  . PPD Test 05/08/2014  . Pneumococcal Polysaccharide-23 05/12/2010  . Td 01/14/2008  . Zoster 01/14/2008    Social History  Substance Use Topics  . Smoking status: Never Smoker  . Smokeless tobacco: Never Used  . Alcohol use No    Review of Systems  UTO 2/2 dementia    Vitals:   10/22/16 1132  BP: 127/80  Pulse: 82  Resp: 16  Temp: 97 F (36.1 C)   Body mass index is 31.5 kg/m. Physical Exam  GENERAL APPEARANCE: Alert, mod conversant, No acute distress  SKIN: No diaphoresis rash HEENT: Unremarkable RESPIRATORY: Breathing is even, unlabored. Lung sounds are clear   CARDIOVASCULAR: Heart RRR no  murmurs, rubs or gallops. No peripheral edema  GASTROINTESTINAL: Abdomen is soft, non-tender, not distended w/ normal bowel sounds.  GENITOURINARY: Bladder non tender, not distended  MUSCULOSKELETAL: No abnormal joints or musculature NEUROLOGIC: Cranial nerves 2-12 grossly intact. Moves all extremities PSYCHIATRIC: dementia, no behavioral issues  Patient Active Problem List   Diagnosis Date Noted  . Macular degeneration 09/06/2016  . Depression 04/23/2016  . Poor appetite 10/11/2015  . Loss of weight 10/11/2015  . Diarrhea 09/10/2015  . Anxiety 07/04/2015  . Mood disorder ([place]) 07/04/2015  . DVT of lower extremity (deep venous thrombosis) ([place]) 01/23/2015  . OA (osteoarthritis) of knee 09/20/2014  . CKD (chronic kidney disease) stage 3, GFR 30-59 ml/min 08/04/2014  . Anemia of chronic disease 08/04/2014  . Hyperlipidemia   . Essential hypertension 04/20/2014  . Vascular dementia with behavior disturbance 04/20/2014  . Paroxysmal atrial fibrillation ([place]) 04/20/2014  . Basal ganglia infarction ([place]) 04/17/2014  . History of breast cancer 08/23/2012    CMP     Component Value Date/Time   NA 145 08/12/2016   K 4.5 08/12/2016   CL 103 04/20/2014 0545   CO2 23 04/20/2014 0545   GLUCOSE 131 (H) 04/20/2014 0545   BUN 37 (A) 08/12/2016   CREATININE 1.2 (A) 08/12/2016   CREATININE 1.15 (H) 04/20/2014 0545   CALCIUM 8.8 04/20/2014 0545   PROT 6.6 09/04/2013 2358   ALBUMIN 3.4 (L) 09/04/2013 2358   AST 14 08/12/2016   ALT 10 08/12/2016   ALKPHOS 64 08/12/2016   BILITOT 0.5 09/04/2013 2358   GFRNONAA 42 (L) 04/20/2014 0545   GFRAA 48 (L) 04/20/2014 0545    Recent Labs  01/30/16 05/01/16 08/12/16  NA 145 142 145  K 5.0 4.5 4.5  BUN 34* 33* 37*  CREATININE 1.2* 1.3* 1.2*    Recent Labs  01/30/16 08/12/16  AST 16 14  ALT 8 10  ALKPHOS 58 64    Recent Labs  01/30/16 05/01/16 08/12/16  WBC 8.1 6.7 7.8  HGB 12.4 9.9* 11.4*  HCT 38 31* 36  PLT 225 201 225     Recent Labs  01/30/16 08/12/16  CHOL 112 89  LDLCALC 60 49  TRIG 130 108   No results found for: [facility] Lab Results  Component Value Date   TSH 4.09 08/12/2016   Lab Results  Component Value Date   HGBA1C 6.1 08/12/2016   Lab Results  Component Value Date   CHOL 89 08/12/2016   HDL 24 (A) 08/12/2016   LDLCALC 49 08/12/2016   TRIG 108 08/12/2016   CHOLHDL 5.7 04/18/2014    Significant Diagnostic Results  in last 30 days:  Dg Op Swallowing Func-medicare/speech Path  Result Date: 10/31/2016 Objective Swallowing Evaluation: Type of Study: MBS-Modified Barium Swallow Study Patient Details Name: ARACELIE ADDIS MRN: [id number] Date of Birth: [birth year] Today's Date: 10/31/2016 Time: SLP Start Time (ACUTE ONLY): 1400-SLP Stop Time (ACUTE ONLY): 1419 SLP Time Calculation (min) (ACUTE ONLY): 19 min Past Medical History: Past Medical History: Diagnosis Date . Arthritis  . COPD (chronic obstructive pulmonary disease) ([place])  . Dementia 02/02/2012 . History of breast cancer 2009  Left breast  s/p lumpectomy and rads. declined hormonal tx. last mammogram 2011 . Hyperlipidemia  . Hypertension  . SVT (supraventricular tachycardia) ([facility])  Past Surgical History: Past Surgical History: Procedure Laterality Date . BREAST LUMPECTOMY Left 2009  left breast . CESAREAN SECTION   . EYE SURGERY    cataracts x3 HPI: 83 yo female with h/o afib, dementia, anxiety d/o, macular degeneration, HLD, low back pain, dysphagia referred for MBS due to pt displeasure with modified diet. Pt on nectar liquids, thin soda and wants thin.   Subjective: pt awake in chair Assessment / Plan / Recommendation CHL IP CLINICAL IMPRESSIONS 10/31/2016 Clinical Impression Limited participation as pt repeatedly stated "take me back upstairs" and would only accept minimal intake.  Despite cues, use of sugar in barium and encouragement, pt only accepted a few boluses.  Pt very distracted and talked throughout evaluation despite cues.   Suspect pt's distraction, frustration contributed greatly to her aspiration.   Aspiration of thin with sequential straw boluses noted due to lack of consistent airway closure.  No aspiration with thin small boluses.  Large bolus of nectar liquids spilled into pharynx/larynx due to poor oral control and were subsequently aspirated before the swallow.  No aspiration of small boluses of thin and pudding.  Pharyngeal swallow was strong and timely when pt cooperative.  Recommend functional meal observation to help determine most appropriate diet.  Unfortunately pt's aspiration was silent and inconsistent in nature due to oral deficits.   SLP Visit Diagnosis Dysphagia, oropharyngeal phase (R13.12) Attention and concentration deficit following -- Frontal lobe and executive function deficit following -- Impact on safety and function Moderate aspiration risk   No flowsheet data found.  No flowsheet data found. CHL IP DIET RECOMMENDATION 10/31/2016 SLP Diet Recommendations Thin liquid Liquid Administration via Cup;Straw Medication Administration Whole meds with puree Compensations Slow rate;Small sips/bites;Follow solids with liquid Postural Changes Remain semi-upright after after feeds/meals (Comment);Seated upright at 90 degrees   CHL IP OTHER RECOMMENDATIONS 10/31/2016 Recommended Consults -- Oral Care Recommendations Oral care before and after PO Other Recommendations --   CHL IP FOLLOW UP RECOMMENDATIONS 04/20/2014 Follow up Recommendations Skilled Nursing facility   [facility] IP FREQUENCY AND DURATION 04/20/2014 Speech Therapy Frequency (ACUTE ONLY) min 1 x/week Treatment Duration --      CHL IP ORAL PHASE 10/31/2016 Oral Phase Impaired Oral - Pudding Teaspoon -- Oral - Pudding Cup -- Oral - Honey Teaspoon -- Oral - Honey Cup -- Oral - Nectar Teaspoon -- Oral - Nectar Cup Decreased bolus cohesion;Premature spillage Oral - Nectar Straw -- Oral - Thin Teaspoon Decreased bolus cohesion;Premature spillage Oral - Thin Cup Decreased  bolus cohesion;Premature spillage Oral - Thin Straw Decreased bolus cohesion;Premature spillage Oral - Puree Decreased bolus cohesion;Premature spillage Oral - Mech Soft -- Oral - Regular -- Oral - Multi-Consistency -- Oral - Pill -- Oral Phase - Comment --  CHL IP PHARYNGEAL PHASE 10/31/2016 Pharyngeal Phase Impaired Pharyngeal- Pudding Teaspoon -- Pharyngeal -- Pharyngeal- Pudding Cup --  Pharyngeal -- Pharyngeal- Honey Teaspoon -- Pharyngeal -- Pharyngeal- Honey Cup -- Pharyngeal -- Pharyngeal- Nectar Teaspoon -- Pharyngeal -- Pharyngeal- Nectar Cup Penetration/Aspiration before swallow;Moderate aspiration Pharyngeal Material enters airway, passes BELOW cords without attempt by patient to eject out (silent aspiration) Pharyngeal- Nectar Straw -- Pharyngeal -- Pharyngeal- Thin Teaspoon WFL Pharyngeal -- Pharyngeal- Thin Cup WFL Pharyngeal -- Pharyngeal- Thin Straw Reduced anterior laryngeal mobility;Reduced laryngeal elevation;Reduced airway/laryngeal closure;Penetration/Aspiration during swallow Pharyngeal -- Pharyngeal- Puree WFL Pharyngeal -- Pharyngeal- Mechanical Soft -- Pharyngeal -- Pharyngeal- Regular -- Pharyngeal -- Pharyngeal- Multi-consistency -- Pharyngeal -- Pharyngeal- Pill -- Pharyngeal -- Pharyngeal Comment --  CHL IP CERVICAL ESOPHAGEAL PHASE 10/31/2016 Cervical Esophageal Phase WFL Pudding Teaspoon -- Pudding Cup -- Honey Teaspoon -- Honey Cup -- Nectar Teaspoon -- Nectar Cup -- Nectar Straw -- Thin Teaspoon -- Thin Cup -- Thin Straw -- Puree -- Mechanical Soft -- Regular -- Multi-consistency -- Pill -- Cervical Esophageal Comment -- CHL IP GO 10/31/2016 Functional Assessment Tool Used mbs Functional Limitations Swallowing Swallow Current Status (H7342) CJ Swallow Goal Status (A7681) CJ Swallow Discharge Status [phone]) Michaell Cowing, MS [facility] SLP [phone]  CLINICAL DATA:  Dysphagia. EXAM: MODIFIED BARIUM SWALLOW TECHNIQUE: Different consistencies of barium were administered orally to the patient by  the Speech Pathologist. Imaging of the pharynx was performed in the lateral projection. FLUOROSCOPY TIME:  Fluoroscopy Time:  42 seconds COMPARISON:  None. FINDINGS: Thin liquid- the patient aspirated during continues drinking, however was able to swallow normally taking individual sips initially. Nectar thick liquid- silent aspiration. Honey- not performed Pure- not performed Cracker-not performed Pure with cracker- not performed Barium tablet -  not performed IMPRESSION: The study is limited due to patient's inability to fully cooperate. Aspiration with thin and nectar thick liquids during continues drinking. However the patient was able to swallow normally taking individual sepsis initially. Please refer to the Speech Pathologists report for complete details and recommendations. Electronically Signed   By: Fidela Salisbury M.D.   On: 10/31/2016 14:38    Assessment and Plan  Paroxysmal atrial fibrillation Stable; prophylaxed with eliquis 2.5 mg BID and rate controlled with metoprolol 25 mg TID  OA (osteoarthritis) of knee No reports of major disomfort ot problems;pt has ultram and tylenol for pain control  CKD (chronic kidney disease) stage 3, GFR 30-59 ml/min Recent BUN/Cr  37/1.2 which is stable from prior; no recent GFR; will monitor at intervals     Webb Silversmith D. Sheppard Coil, MD

## 2016-10-23 ENCOUNTER — Other Ambulatory Visit (HOSPITAL_COMMUNITY): Payer: Self-pay | Admitting: Internal Medicine

## 2016-10-23 DIAGNOSIS — R1319 Other dysphagia: Secondary | ICD-10-CM

## 2016-10-23 DIAGNOSIS — F0150 Vascular dementia without behavioral disturbance: Secondary | ICD-10-CM | POA: Diagnosis not present

## 2016-10-23 DIAGNOSIS — R1312 Dysphagia, oropharyngeal phase: Secondary | ICD-10-CM | POA: Diagnosis not present

## 2016-10-24 DIAGNOSIS — R1312 Dysphagia, oropharyngeal phase: Secondary | ICD-10-CM | POA: Diagnosis not present

## 2016-10-24 DIAGNOSIS — F0150 Vascular dementia without behavioral disturbance: Secondary | ICD-10-CM | POA: Diagnosis not present

## 2016-10-26 DIAGNOSIS — F0150 Vascular dementia without behavioral disturbance: Secondary | ICD-10-CM | POA: Diagnosis not present

## 2016-10-26 DIAGNOSIS — R1312 Dysphagia, oropharyngeal phase: Secondary | ICD-10-CM | POA: Diagnosis not present

## 2016-10-27 DIAGNOSIS — R1312 Dysphagia, oropharyngeal phase: Secondary | ICD-10-CM | POA: Diagnosis not present

## 2016-10-27 DIAGNOSIS — F0150 Vascular dementia without behavioral disturbance: Secondary | ICD-10-CM | POA: Diagnosis not present

## 2016-10-28 DIAGNOSIS — F0150 Vascular dementia without behavioral disturbance: Secondary | ICD-10-CM | POA: Diagnosis not present

## 2016-10-28 DIAGNOSIS — R1312 Dysphagia, oropharyngeal phase: Secondary | ICD-10-CM | POA: Diagnosis not present

## 2016-10-29 DIAGNOSIS — F0150 Vascular dementia without behavioral disturbance: Secondary | ICD-10-CM | POA: Diagnosis not present

## 2016-10-29 DIAGNOSIS — R1312 Dysphagia, oropharyngeal phase: Secondary | ICD-10-CM | POA: Diagnosis not present

## 2016-10-31 ENCOUNTER — Ambulatory Visit (HOSPITAL_COMMUNITY)
Admission: RE | Admit: 2016-10-31 | Discharge: 2016-10-31 | Disposition: A | Discharge status: Home / Self Care | Payer: Medicare Other | Source: Ambulatory Visit | Attending: Internal Medicine | Admitting: Internal Medicine

## 2016-10-31 DIAGNOSIS — Y844 Aspiration of fluid as the cause of abnormal reaction of the patient, or of later complication, without mention of misadventure at the time of the procedure: Secondary | ICD-10-CM | POA: Diagnosis not present

## 2016-10-31 DIAGNOSIS — R1310 Dysphagia, unspecified: Secondary | ICD-10-CM | POA: Diagnosis not present

## 2016-10-31 DIAGNOSIS — R1319 Other dysphagia: Secondary | ICD-10-CM

## 2016-10-31 DIAGNOSIS — F0150 Vascular dementia without behavioral disturbance: Secondary | ICD-10-CM | POA: Diagnosis not present

## 2016-10-31 DIAGNOSIS — R1312 Dysphagia, oropharyngeal phase: Secondary | ICD-10-CM | POA: Diagnosis not present

## 2016-11-01 DIAGNOSIS — R1312 Dysphagia, oropharyngeal phase: Secondary | ICD-10-CM | POA: Diagnosis not present

## 2016-11-01 DIAGNOSIS — F0150 Vascular dementia without behavioral disturbance: Secondary | ICD-10-CM | POA: Diagnosis not present

## 2016-11-04 DIAGNOSIS — R1312 Dysphagia, oropharyngeal phase: Secondary | ICD-10-CM | POA: Diagnosis not present

## 2016-11-04 DIAGNOSIS — F0150 Vascular dementia without behavioral disturbance: Secondary | ICD-10-CM | POA: Diagnosis not present

## 2016-11-05 DIAGNOSIS — F0150 Vascular dementia without behavioral disturbance: Secondary | ICD-10-CM | POA: Diagnosis not present

## 2016-11-05 DIAGNOSIS — R1312 Dysphagia, oropharyngeal phase: Secondary | ICD-10-CM | POA: Diagnosis not present

## 2016-11-06 DIAGNOSIS — F0150 Vascular dementia without behavioral disturbance: Secondary | ICD-10-CM | POA: Diagnosis not present

## 2016-11-06 DIAGNOSIS — R1312 Dysphagia, oropharyngeal phase: Secondary | ICD-10-CM | POA: Diagnosis not present

## 2016-11-07 DIAGNOSIS — F0150 Vascular dementia without behavioral disturbance: Secondary | ICD-10-CM | POA: Diagnosis not present

## 2016-11-07 DIAGNOSIS — R1312 Dysphagia, oropharyngeal phase: Secondary | ICD-10-CM | POA: Diagnosis not present

## 2016-11-08 DIAGNOSIS — F0150 Vascular dementia without behavioral disturbance: Secondary | ICD-10-CM | POA: Diagnosis not present

## 2016-11-08 DIAGNOSIS — R1312 Dysphagia, oropharyngeal phase: Secondary | ICD-10-CM | POA: Diagnosis not present

## 2016-11-09 DIAGNOSIS — R1312 Dysphagia, oropharyngeal phase: Secondary | ICD-10-CM | POA: Diagnosis not present

## 2016-11-09 DIAGNOSIS — F0150 Vascular dementia without behavioral disturbance: Secondary | ICD-10-CM | POA: Diagnosis not present

## 2016-11-10 DIAGNOSIS — R1312 Dysphagia, oropharyngeal phase: Secondary | ICD-10-CM | POA: Diagnosis not present

## 2016-11-10 DIAGNOSIS — F0150 Vascular dementia without behavioral disturbance: Secondary | ICD-10-CM | POA: Diagnosis not present

## 2016-11-12 DIAGNOSIS — F39 Unspecified mood [affective] disorder: Secondary | ICD-10-CM | POA: Diagnosis not present

## 2016-11-12 DIAGNOSIS — F419 Anxiety disorder, unspecified: Secondary | ICD-10-CM | POA: Diagnosis not present

## 2016-11-12 DIAGNOSIS — F0390 Unspecified dementia without behavioral disturbance: Secondary | ICD-10-CM | POA: Diagnosis not present

## 2016-11-18 ENCOUNTER — Encounter: Payer: Self-pay | Admitting: Internal Medicine

## 2016-11-18 ENCOUNTER — Non-Acute Institutional Stay (SKILLED_NURSING_FACILITY): Payer: Medicare Other | Admitting: Internal Medicine

## 2016-11-18 DIAGNOSIS — H3530 Unspecified macular degeneration: Secondary | ICD-10-CM | POA: Diagnosis not present

## 2016-11-18 DIAGNOSIS — F419 Anxiety disorder, unspecified: Secondary | ICD-10-CM | POA: Diagnosis not present

## 2016-11-18 DIAGNOSIS — F39 Unspecified mood [affective] disorder: Secondary | ICD-10-CM

## 2016-11-18 NOTE — Progress Notes (Signed)
Location:  [place] Room Number: 203D Place of Service:  SNF (31)  Hennie Duos, MD  Patient Care Team: Hennie Duos, MD as PCP - General (Internal Medicine)  Extended Emergency Contact Information Primary Emergency Contact: [facility] Address: [place], [place] [address] [name] Phone: [phone] Relation: Grandson    Allergies: Patient has no known allergies.  Chief Complaint  Patient presents with  . Medical Management of Chronic Issues    Routine Visit    HPI: Patient is 83 y.o. female who Is being seen for routine issues of mood disorder, macular degeneration, and anxiety.  Past Medical History:  Diagnosis Date  . Arthritis   . COPD (chronic obstructive pulmonary disease) ([place])   . Dementia 02/02/2012  . History of breast cancer 2009   Left breast  s/p lumpectomy and rads. declined hormonal tx. last mammogram 2011  . Hyperlipidemia   . Hypertension   . PAF (paroxysmal atrial fibrillation) ([place])   . SVT (supraventricular tachycardia) ([facility])     Past Surgical History:  Procedure Laterality Date  . BREAST LUMPECTOMY Left 2009   left breast  . CESAREAN SECTION    . EYE SURGERY     cataracts x3    Allergies as of 11/18/2016   No Known Allergies     Medication List       Accurate as of 11/18/16 11:59 PM. Always use your most recent med list.          acetaminophen 325 MG tablet Commonly known as:  TYLENOL Take 650 mg by mouth every 6 (six) hours as needed (for pain). Notify MD if pain is not relieved. Do not exceed 3000 mg in 24 hour period   atorvastatin 40 MG tablet Commonly known as:  LIPITOR Take 1 tablet (40 mg total) by mouth daily at 6 PM.   ELIQUIS 2.5 MG Tabs tablet Generic drug:  apixaban Take 2.5 mg by mouth 2 (two) times daily.   feeding supplement (PRO-STAT SUGAR FREE 64) Liqd Take 30 mLs by mouth daily at 6 PM.   Ferrous Sulfate 220 (44 Fe) MG/5ML  Liqd Take 7.5 mLs by mouth daily. 330 mg   LORazepam 0.5 MG tablet Commonly known as:  ATIVAN Take 0.5 tablets (0.25 mg total) by mouth daily at 12 noon.   memantine 10 MG tablet Commonly known as:  NAMENDA Take 10 mg by mouth 2 (two) times daily.   metoprolol tartrate 25 MG tablet Commonly known as:  LOPRESSOR Take 1 tablet (25 mg total) by mouth 3 (three) times daily.   mirtazapine 15 MG tablet Commonly known as:  REMERON Take 1/2 tablet ( 7.5 mg ) by mouth at bedtime   PRESERVISION AREDS 2 PO Take 1 tablet by mouth 2 (two) times daily.   promethazine 25 MG suppository Commonly known as:  PHENERGAN If unable to give suppository every 6 hours PRN x 3 doses notify MD if symptoms persist for more than 24 hours   Propylene Glycol 0.6 % Soln Place 1 drop into both eyes 2 (two) times daily.   senna 8.6 MG Tabs tablet Commonly known as:  SENOKOT Take 1 tablet by mouth daily.   traMADol 50 MG tablet Commonly known as:  ULTRAM Take 1 tablet (50 mg total) by mouth every 6 (six) hours as needed for moderate pain or severe pain.       No orders of  the defined types were placed in this encounter.   Immunization History  Administered Date(s) Administered  . Influenza Inj Mdck Quad Pf 03/09/2012  . Influenza Split 04/20/2009, 05/12/2010  . Influenza, Seasonal, Injecte, Preservative Fre 03/21/2013  . Influenza-Unspecified 03/22/2015  . PPD Test 05/08/2014  . Pneumococcal Polysaccharide-23 05/12/2010  . Td 01/14/2008  . Zoster 01/14/2008    Social History  Substance Use Topics  . Smoking status: Never Smoker  . Smokeless tobacco: Never Used  . Alcohol use No    Review of Systems UTO 2/2 dementia;nursing without concerns    Vitals:   11/18/16 1046  BP: 127/80  Pulse: 75  Resp: 16  Temp: 97 F (36.1 C)   Body mass index is 30.58 kg/m. Physical Exam  GENERAL APPEARANCE: Alert, conversant, No acute distress  SKIN: No diaphoresis rash HEENT:  Unremarkable RESPIRATORY: Breathing is even, unlabored. Lung sounds are clear   CARDIOVASCULAR: Heart RRR no murmurs, rubs or gallops. No peripheral edema  GASTROINTESTINAL: Abdomen is soft, non-tender, not distended w/ normal bowel sounds.  GENITOURINARY: Bladder non tender, not distended  MUSCULOSKELETAL: No abnormal joints or musculature NEUROLOGIC: Cranial nerves 2-12 grossly intact. Moves all extremities PSYCHIATRIC: Dementia, no behavioral issues  Patient Active Problem List   Diagnosis Date Noted  . Klebsiella pneumoniae (k. pneumoniae) as the cause of diseases classified elsewhere 12/09/2016  . Pyelonephritis 12/09/2016  . Chronic diastolic CHF (congestive heart failure) ([place]) 12/02/2016  . Protein calorie malnutrition ([place]) 12/02/2016  . Macular degeneration 09/06/2016  . Depression 04/23/2016  . Poor appetite 10/11/2015  . Loss of weight 10/11/2015  . Diarrhea 09/10/2015  . Anxiety 07/04/2015  . Mood disorder ([place]) 07/04/2015  . DVT of lower extremity (deep venous thrombosis) ([place]) 01/23/2015  . OA (osteoarthritis) of knee 09/20/2014  . CKD (chronic kidney disease) stage 3, GFR 30-59 ml/min 08/04/2014  . Anemia of chronic disease 08/04/2014  . Hyperlipidemia   . Essential hypertension 04/20/2014  . Vascular dementia with behavior disturbance 04/20/2014  . Paroxysmal atrial fibrillation ([place]) 04/20/2014  . Basal ganglia infarction ([place]) 04/17/2014  . History of breast cancer 08/23/2012    CMP     Component Value Date/Time   NA 144 12/12/2016   K 3.2 (A) 12/12/2016   CL 105 12/06/2016 0346   CO2 20 (L) 12/06/2016 0346   GLUCOSE 85 12/06/2016 0346   BUN 13 12/12/2016   CREATININE 0.7 12/12/2016   CREATININE 1.43 (H) 12/06/2016 0346   CALCIUM 7.6 (L) 12/06/2016 0346   PROT 6.3 (L) 12/02/2016 0556   ALBUMIN 2.3 (L) 12/02/2016 0556   AST 16 12/02/2016 0556   ALT 13 (L) 12/02/2016 0556   ALKPHOS 74 12/02/2016 0556   BILITOT 0.9 12/02/2016 0556   GFRNONAA 31 (L)  12/06/2016 0346   GFRAA 36 (L) 12/06/2016 0346    Recent Labs  12/03/16 0103  12/04/16 0314 12/06/16 12/06/16 0346 12/12/16  NA 136  < > 135 134* 134* 144  K 3.9  < > 4.1  --  3.7 3.2*  CL 107  --  106  --  105  --   CO2 18*  --  19*  --  20*  --   GLUCOSE 131*  --  124*  --  85  --   BUN 33*  < > 27* 23* 23* 13  CREATININE 2.28*  < > 1.93* 1.4* 1.43* 0.7  CALCIUM 7.2*  --  7.6*  --  7.6*  --   < > = values in  this interval not displayed.  Recent Labs  01/30/16 08/12/16 12/02/16 0556  AST 16 14 16   ALT 8 10 13*  ALKPHOS 58 64 74  BILITOT  --   --  0.9  PROT  --   --  6.3*  ALBUMIN  --   --  2.3*    Recent Labs  12/02/16 0556  12/03/16 0103 12/04/16 12/04/16 0314 12/06/16 12/06/16 0346  WBC 21.5*  < > 13.1* 10.8 10.8* 7.2 7.2  NEUTROABS 17.8*  --   --   --   --   --   --   HGB 10.5*  < > 9.7* 10.5* 10.5*  --  10.1*  HCT 34.1*  < > 32.3* 34* 34.3*  --  32.7*  MCV 89.5  --  90.0  --  88.6  --  88.9  PLT 341  < > 258 230 230  --  192  < > = values in this interval not displayed.  Recent Labs  01/30/16 08/12/16  CHOL 112 89  LDLCALC 60 49  TRIG 130 108   No results found for: [facility] Lab Results  Component Value Date   TSH 4.09 08/12/2016   Lab Results  Component Value Date   HGBA1C 6.1 08/12/2016   Lab Results  Component Value Date   CHOL 89 08/12/2016   HDL 24 (A) 08/12/2016   LDLCALC 49 08/12/2016   TRIG 108 08/12/2016   CHOLHDL 5.7 04/18/2014    Significant Diagnostic Results in last 30 days:  No results found.  Assessment and Plan  Mood disorder ([place]) Mood has been stable now the Depakote has been DC; will continue to monitor  Macular degeneration Patient is followed by Dr. who comes at the facility; continue PreserVision AREDS 2 one by mouth twice a day  Anxiety Patient is doing well off of her Ativan at noon; continue to monitor     Geoffrey Mankin D. Sheppard Coil, MD

## 2016-11-21 ENCOUNTER — Other Ambulatory Visit: Payer: Self-pay | Admitting: *Deleted

## 2016-11-21 MED ORDER — LORAZEPAM 0.5 MG PO TABS: 0.2500 mg | ORAL_TABLET | Freq: Every day | ORAL | 0 refills | Status: DC

## 2016-11-21 NOTE — Telephone Encounter (Signed)
[facility] #[phone] Fax: [phone]  

## 2016-11-23 NOTE — Assessment & Plan Note (Signed)
Recent BUN/Cr  37/1.2 which is stable from prior; no recent GFR; will monitor at intervals

## 2016-11-23 NOTE — Assessment & Plan Note (Signed)
Stable; prophylaxed with eliquis 2.5 mg BID and rate controlled with metoprolol 25 mg TID

## 2016-11-23 NOTE — Assessment & Plan Note (Signed)
No reports of major disomfort ot problems;pt has ultram and tylenol for pain control

## 2016-12-02 ENCOUNTER — Inpatient Hospital Stay (HOSPITAL_COMMUNITY)
Admission: EM | Admit: 2016-12-02 | Discharge: 2016-12-07 | DRG: 871 | Disposition: A | Discharge status: Skilled Nursing Facility | Payer: Medicare Other | Attending: Internal Medicine | Admitting: Internal Medicine

## 2016-12-02 ENCOUNTER — Encounter (HOSPITAL_COMMUNITY): Payer: Self-pay | Admitting: Emergency Medicine

## 2016-12-02 ENCOUNTER — Emergency Department (HOSPITAL_COMMUNITY): Payer: Medicare Other

## 2016-12-02 DIAGNOSIS — Z79899 Other long term (current) drug therapy: Secondary | ICD-10-CM

## 2016-12-02 DIAGNOSIS — J449 Chronic obstructive pulmonary disease, unspecified: Secondary | ICD-10-CM | POA: Diagnosis present

## 2016-12-02 DIAGNOSIS — R0902 Hypoxemia: Secondary | ICD-10-CM | POA: Diagnosis present

## 2016-12-02 DIAGNOSIS — G9349 Other encephalopathy: Secondary | ICD-10-CM | POA: Diagnosis present

## 2016-12-02 DIAGNOSIS — E784 Other hyperlipidemia: Secondary | ICD-10-CM

## 2016-12-02 DIAGNOSIS — A4159 Other Gram-negative sepsis: Secondary | ICD-10-CM | POA: Diagnosis not present

## 2016-12-02 DIAGNOSIS — Z66 Do not resuscitate: Secondary | ICD-10-CM | POA: Diagnosis present

## 2016-12-02 DIAGNOSIS — J189 Pneumonia, unspecified organism: Secondary | ICD-10-CM | POA: Diagnosis not present

## 2016-12-02 DIAGNOSIS — F329 Major depressive disorder, single episode, unspecified: Secondary | ICD-10-CM | POA: Diagnosis present

## 2016-12-02 DIAGNOSIS — G9341 Metabolic encephalopathy: Secondary | ICD-10-CM | POA: Diagnosis present

## 2016-12-02 DIAGNOSIS — E440 Moderate protein-calorie malnutrition: Secondary | ICD-10-CM | POA: Diagnosis not present

## 2016-12-02 DIAGNOSIS — I9589 Other hypotension: Secondary | ICD-10-CM | POA: Diagnosis not present

## 2016-12-02 DIAGNOSIS — F32A Depression, unspecified: Secondary | ICD-10-CM | POA: Diagnosis present

## 2016-12-02 DIAGNOSIS — N12 Tubulo-interstitial nephritis, not specified as acute or chronic: Secondary | ICD-10-CM | POA: Diagnosis present

## 2016-12-02 DIAGNOSIS — I69354 Hemiplegia and hemiparesis following cerebral infarction affecting left non-dominant side: Secondary | ICD-10-CM | POA: Diagnosis not present

## 2016-12-02 DIAGNOSIS — R0602 Shortness of breath: Secondary | ICD-10-CM | POA: Diagnosis not present

## 2016-12-02 DIAGNOSIS — I480 Paroxysmal atrial fibrillation: Secondary | ICD-10-CM | POA: Diagnosis not present

## 2016-12-02 DIAGNOSIS — F0151 Vascular dementia with behavioral disturbance: Secondary | ICD-10-CM | POA: Diagnosis not present

## 2016-12-02 DIAGNOSIS — E785 Hyperlipidemia, unspecified: Secondary | ICD-10-CM | POA: Diagnosis present

## 2016-12-02 DIAGNOSIS — I10 Essential (primary) hypertension: Secondary | ICD-10-CM | POA: Diagnosis present

## 2016-12-02 DIAGNOSIS — F320 Major depressive disorder, single episode, mild: Secondary | ICD-10-CM | POA: Diagnosis not present

## 2016-12-02 DIAGNOSIS — B961 Klebsiella pneumoniae [K. pneumoniae] as the cause of diseases classified elsewhere: Secondary | ICD-10-CM | POA: Diagnosis present

## 2016-12-02 DIAGNOSIS — R4182 Altered mental status, unspecified: Secondary | ICD-10-CM | POA: Diagnosis not present

## 2016-12-02 DIAGNOSIS — M6281 Muscle weakness (generalized): Secondary | ICD-10-CM | POA: Diagnosis not present

## 2016-12-02 DIAGNOSIS — N179 Acute kidney failure, unspecified: Secondary | ICD-10-CM | POA: Diagnosis present

## 2016-12-02 DIAGNOSIS — G9340 Encephalopathy, unspecified: Secondary | ICD-10-CM | POA: Diagnosis not present

## 2016-12-02 DIAGNOSIS — N390 Urinary tract infection, site not specified: Secondary | ICD-10-CM

## 2016-12-02 DIAGNOSIS — Z853 Personal history of malignant neoplasm of breast: Secondary | ICD-10-CM | POA: Diagnosis not present

## 2016-12-02 DIAGNOSIS — G894 Chronic pain syndrome: Secondary | ICD-10-CM | POA: Diagnosis not present

## 2016-12-02 DIAGNOSIS — I4891 Unspecified atrial fibrillation: Secondary | ICD-10-CM | POA: Diagnosis not present

## 2016-12-02 DIAGNOSIS — Z7901 Long term (current) use of anticoagulants: Secondary | ICD-10-CM

## 2016-12-02 DIAGNOSIS — I110 Hypertensive heart disease with heart failure: Secondary | ICD-10-CM | POA: Diagnosis present

## 2016-12-02 DIAGNOSIS — R159 Full incontinence of feces: Secondary | ICD-10-CM | POA: Diagnosis present

## 2016-12-02 DIAGNOSIS — I959 Hypotension, unspecified: Secondary | ICD-10-CM | POA: Diagnosis not present

## 2016-12-02 DIAGNOSIS — F0390 Unspecified dementia without behavioral disturbance: Secondary | ICD-10-CM | POA: Diagnosis not present

## 2016-12-02 DIAGNOSIS — I482 Chronic atrial fibrillation: Secondary | ICD-10-CM | POA: Diagnosis not present

## 2016-12-02 DIAGNOSIS — G8929 Other chronic pain: Secondary | ICD-10-CM | POA: Diagnosis present

## 2016-12-02 DIAGNOSIS — I5032 Chronic diastolic (congestive) heart failure: Secondary | ICD-10-CM

## 2016-12-02 DIAGNOSIS — Z833 Family history of diabetes mellitus: Secondary | ICD-10-CM

## 2016-12-02 DIAGNOSIS — E860 Dehydration: Secondary | ICD-10-CM | POA: Diagnosis present

## 2016-12-02 DIAGNOSIS — A419 Sepsis, unspecified organism: Secondary | ICD-10-CM | POA: Diagnosis not present

## 2016-12-02 DIAGNOSIS — Z8249 Family history of ischemic heart disease and other diseases of the circulatory system: Secondary | ICD-10-CM

## 2016-12-02 DIAGNOSIS — A414 Sepsis due to anaerobes: Secondary | ICD-10-CM | POA: Diagnosis not present

## 2016-12-02 DIAGNOSIS — R509 Fever, unspecified: Secondary | ICD-10-CM | POA: Diagnosis not present

## 2016-12-02 DIAGNOSIS — Z6831 Body mass index (BMI) 31.0-31.9, adult: Secondary | ICD-10-CM

## 2016-12-02 DIAGNOSIS — N10 Acute tubulo-interstitial nephritis: Secondary | ICD-10-CM | POA: Diagnosis not present

## 2016-12-02 DIAGNOSIS — E46 Unspecified protein-calorie malnutrition: Secondary | ICD-10-CM | POA: Diagnosis present

## 2016-12-02 DIAGNOSIS — R488 Other symbolic dysfunctions: Secondary | ICD-10-CM | POA: Diagnosis not present

## 2016-12-02 DIAGNOSIS — I69993 Ataxia following unspecified cerebrovascular disease: Secondary | ICD-10-CM | POA: Diagnosis not present

## 2016-12-02 DIAGNOSIS — R1312 Dysphagia, oropharyngeal phase: Secondary | ICD-10-CM | POA: Diagnosis not present

## 2016-12-02 DIAGNOSIS — R402421 Glasgow coma scale score 9-12, in the field [EMT or ambulance]: Secondary | ICD-10-CM | POA: Diagnosis not present

## 2016-12-02 DIAGNOSIS — F01518 Vascular dementia, unspecified severity, with other behavioral disturbance: Secondary | ICD-10-CM | POA: Diagnosis present

## 2016-12-02 DIAGNOSIS — I509 Heart failure, unspecified: Secondary | ICD-10-CM | POA: Diagnosis not present

## 2016-12-02 HISTORY — DX: Chronic diastolic (congestive) heart failure: I50.32

## 2016-12-02 HISTORY — DX: Paroxysmal atrial fibrillation: I48.0

## 2016-12-02 LAB — MRSA PCR SCREENING: MRSA BY PCR: NEGATIVE

## 2016-12-02 LAB — COMPREHENSIVE METABOLIC PANEL
ALBUMIN: 2.3 g/dL — AB (ref 3.5–5.0)
ALK PHOS: 74 U/L (ref 38–126)
ALT: 13 U/L — AB (ref 14–54)
ANION GAP: 14 (ref 5–15)
AST: 16 U/L (ref 15–41)
BILIRUBIN TOTAL: 0.9 mg/dL (ref 0.3–1.2)
BUN: 43 mg/dL — AB (ref 6–20)
CALCIUM: 8.2 mg/dL — AB (ref 8.9–10.3)
CO2: 23 mmol/L (ref 22–32)
CREATININE: 2.87 mg/dL — AB (ref 0.44–1.00)
Chloride: 100 mmol/L — ABNORMAL LOW (ref 101–111)
GFR calc Af Amer: 16 mL/min — ABNORMAL LOW (ref >60)
GFR calc non Af Amer: 13 mL/min — ABNORMAL LOW (ref >60)
GLUCOSE: 137 mg/dL — AB (ref 65–99)
Potassium: 4.2 mmol/L (ref 3.5–5.1)
SODIUM: 137 mmol/L (ref 135–145)
TOTAL PROTEIN: 6.3 g/dL — AB (ref 6.5–8.1)

## 2016-12-02 LAB — BASIC METABOLIC PANEL
BUN: 43 — AB (ref 4–21)
Creatinine: 2.9 — AB (ref 0.5–1.1)
Glucose: 137
POTASSIUM: 4.2 (ref 3.4–5.3)
Sodium: 137 (ref 137–147)

## 2016-12-02 LAB — CBC WITH DIFFERENTIAL/PLATELET
BASOS ABS: 0 10*3/uL (ref 0.0–0.1)
BASOS PCT: 0 %
EOS PCT: 0 %
Eosinophils Absolute: 0 10*3/uL (ref 0.0–0.7)
HEMATOCRIT: 34.1 % — AB (ref 36.0–46.0)
Hemoglobin: 10.5 g/dL — ABNORMAL LOW (ref 12.0–15.0)
Lymphocytes Relative: 13 %
Lymphs Abs: 2.7 10*3/uL (ref 0.7–4.0)
MCH: 27.6 pg (ref 26.0–34.0)
MCHC: 30.8 g/dL (ref 30.0–36.0)
MCV: 89.5 fL (ref 78.0–100.0)
MONO ABS: 0.9 10*3/uL (ref 0.1–1.0)
MONOS PCT: 4 %
NEUTROS ABS: 17.8 10*3/uL — AB (ref 1.7–7.7)
Neutrophils Relative %: 83 %
PLATELETS: 341 10*3/uL (ref 150–400)
RBC: 3.81 MIL/uL — ABNORMAL LOW (ref 3.87–5.11)
RDW: 16.8 % — AB (ref 11.5–15.5)
WBC: 21.5 10*3/uL — ABNORMAL HIGH (ref 4.0–10.5)

## 2016-12-02 LAB — URINALYSIS, ROUTINE W REFLEX MICROSCOPIC
Bilirubin Urine: NEGATIVE
Glucose, UA: NEGATIVE mg/dL
Ketones, ur: NEGATIVE mg/dL
NITRITE: NEGATIVE
PH: 6 (ref 5.0–8.0)
Protein, ur: 100 mg/dL — AB
SPECIFIC GRAVITY, URINE: 1.012 (ref 1.005–1.030)
SQUAMOUS EPITHELIAL / LPF: NONE SEEN

## 2016-12-02 LAB — I-STAT ARTERIAL BLOOD GAS, ED
Acid-base deficit: 3 mmol/L — ABNORMAL HIGH (ref 0.0–2.0)
BICARBONATE: 21.6 mmol/L (ref 20.0–28.0)
O2 Saturation: 98 %
PCO2 ART: 36.2 mmHg (ref 32.0–48.0)
PO2 ART: 106 mmHg (ref 83.0–108.0)
Patient temperature: 100.2
TCO2: 23 mmol/L (ref 0–100)
pH, Arterial: 7.389 (ref 7.350–7.450)

## 2016-12-02 LAB — CBC AND DIFFERENTIAL
HCT: 34 — AB (ref 36–46)
HEMOGLOBIN: 10.5 — AB (ref 12.0–16.0)
Platelets: 341 (ref 150–399)
WBC: 21.5

## 2016-12-02 LAB — PROCALCITONIN: PROCALCITONIN: 2.65 ng/mL

## 2016-12-02 LAB — I-STAT CG4 LACTIC ACID, ED: Lactic Acid, Venous: 1.3 mmol/L (ref 0.5–1.9)

## 2016-12-02 LAB — HEPATIC FUNCTION PANEL: Bilirubin, Total: 0.9

## 2016-12-02 LAB — PROTIME-INR
INR: 1.82
PROTHROMBIN TIME: 21.3 s — AB (ref 11.4–15.2)

## 2016-12-02 LAB — LACTIC ACID, PLASMA: Lactic Acid, Venous: 4.1 mmol/L (ref 0.5–1.9)

## 2016-12-02 MED ORDER — VANCOMYCIN HCL IN DEXTROSE 1-5 GM/200ML-% IV SOLN: 1000.0000 mg | INTRAVENOUS | Status: DC

## 2016-12-02 MED ORDER — PRO-STAT SUGAR FREE PO LIQD
30.0000 mL | Freq: Every day | ORAL | Status: DC
  Administered 2016-12-02 – 2016-12-06 (×4): 30 mL via ORAL
  Filled 2016-12-02 (×4): qty 30

## 2016-12-02 MED ORDER — PIPERACILLIN-TAZOBACTAM 3.375 G IVPB 30 MIN
3.3750 g | Freq: Once | INTRAVENOUS | Status: AC
  Administered 2016-12-02: 3.375 g via INTRAVENOUS
  Filled 2016-12-02: qty 50

## 2016-12-02 MED ORDER — PROMETHAZINE HCL 25 MG/ML IJ SOLN: 12.5000 mg | Freq: Four times a day (QID) | INTRAMUSCULAR | Status: DC | PRN

## 2016-12-02 MED ORDER — MEMANTINE HCL 10 MG PO TABS
10.0000 mg | ORAL_TABLET | Freq: Two times a day (BID) | ORAL | Status: DC
  Administered 2016-12-02 – 2016-12-07 (×11): 10 mg via ORAL
  Filled 2016-12-02 (×12): qty 1

## 2016-12-02 MED ORDER — SODIUM CHLORIDE 0.9 % IV BOLUS (SEPSIS)
1000.0000 mL | Freq: Once | INTRAVENOUS | Status: AC
  Administered 2016-12-02: 1000 mL via INTRAVENOUS

## 2016-12-02 MED ORDER — ENSURE PUDDING PO PUDG: 1.0000 | Freq: Two times a day (BID) | ORAL | Status: DC

## 2016-12-02 MED ORDER — SENNA 8.6 MG PO TABS
1.0000 | ORAL_TABLET | Freq: Every day | ORAL | Status: DC
  Administered 2016-12-03 – 2016-12-07 (×5): 8.6 mg via ORAL
  Filled 2016-12-02 (×5): qty 1

## 2016-12-02 MED ORDER — SODIUM CHLORIDE 0.9 % IV BOLUS (SEPSIS)
500.0000 mL | Freq: Once | INTRAVENOUS | Status: AC
  Administered 2016-12-02: 500 mL via INTRAVENOUS

## 2016-12-02 MED ORDER — METOPROLOL TARTRATE 5 MG/5ML IV SOLN
5.0000 mg | Freq: Once | INTRAVENOUS | Status: AC
  Administered 2016-12-02: 5 mg via INTRAVENOUS
  Filled 2016-12-02: qty 5

## 2016-12-02 MED ORDER — HALOPERIDOL LACTATE 5 MG/ML IJ SOLN: 5.0000 mg | Freq: Four times a day (QID) | INTRAMUSCULAR | Status: DC | PRN

## 2016-12-02 MED ORDER — ONDANSETRON HCL 4 MG PO TABS: 4.0000 mg | ORAL_TABLET | Freq: Four times a day (QID) | ORAL | Status: DC | PRN

## 2016-12-02 MED ORDER — FERROUS SULFATE 220 (44 FE) MG/5ML PO ELIX
330.0000 mg | ORAL_SOLUTION | Freq: Every day | ORAL | Status: DC
  Administered 2016-12-03: 330 mg via ORAL
  Filled 2016-12-02: qty 7.5

## 2016-12-02 MED ORDER — APIXABAN 2.5 MG PO TABS
2.5000 mg | ORAL_TABLET | Freq: Two times a day (BID) | ORAL | Status: DC
  Administered 2016-12-02 – 2016-12-07 (×10): 2.5 mg via ORAL
  Filled 2016-12-02 (×10): qty 1

## 2016-12-02 MED ORDER — ATORVASTATIN CALCIUM 40 MG PO TABS
40.0000 mg | ORAL_TABLET | Freq: Every day | ORAL | Status: DC
  Administered 2016-12-02: 40 mg via ORAL
  Filled 2016-12-02: qty 1

## 2016-12-02 MED ORDER — ACETAMINOPHEN 650 MG RE SUPP: 650.0000 mg | Freq: Four times a day (QID) | RECTAL | Status: DC | PRN

## 2016-12-02 MED ORDER — MIRTAZAPINE 15 MG PO TABS
15.0000 mg | ORAL_TABLET | Freq: Every day | ORAL | Status: DC
  Administered 2016-12-02 – 2016-12-06 (×5): 15 mg via ORAL
  Filled 2016-12-02 (×6): qty 1

## 2016-12-02 MED ORDER — PIPERACILLIN-TAZOBACTAM IN DEX 2-0.25 GM/50ML IV SOLN
2.2500 g | Freq: Three times a day (TID) | INTRAVENOUS | Status: DC
  Administered 2016-12-02 – 2016-12-04 (×6): 2.25 g via INTRAVENOUS
  Filled 2016-12-02 (×7): qty 50

## 2016-12-02 MED ORDER — ONDANSETRON HCL 4 MG/2ML IJ SOLN: 4.0000 mg | Freq: Four times a day (QID) | INTRAMUSCULAR | Status: DC | PRN

## 2016-12-02 MED ORDER — ACETAMINOPHEN 325 MG PO TABS
650.0000 mg | ORAL_TABLET | Freq: Four times a day (QID) | ORAL | Status: DC | PRN
  Administered 2016-12-02: 650 mg via ORAL
  Filled 2016-12-02: qty 2

## 2016-12-02 MED ORDER — TRAMADOL HCL 50 MG PO TABS
50.0000 mg | ORAL_TABLET | Freq: Four times a day (QID) | ORAL | Status: DC | PRN
  Administered 2016-12-02: 50 mg via ORAL
  Filled 2016-12-02: qty 1

## 2016-12-02 MED ORDER — VANCOMYCIN HCL IN DEXTROSE 1-5 GM/200ML-% IV SOLN
1000.0000 mg | Freq: Once | INTRAVENOUS | Status: AC
  Administered 2016-12-02: 1000 mg via INTRAVENOUS
  Filled 2016-12-02: qty 200

## 2016-12-02 MED ORDER — LORAZEPAM 0.5 MG PO TABS
0.2500 mg | ORAL_TABLET | Freq: Every day | ORAL | Status: DC | PRN
  Administered 2016-12-02: 0.25 mg via ORAL
  Filled 2016-12-02: qty 1

## 2016-12-02 MED ORDER — METOPROLOL TARTRATE 25 MG PO TABS
25.0000 mg | ORAL_TABLET | Freq: Three times a day (TID) | ORAL | Status: DC
Start: 2016-12-02 — End: 2016-12-04
  Administered 2016-12-02 – 2016-12-04 (×5): 25 mg via ORAL
  Filled 2016-12-02 (×5): qty 1

## 2016-12-02 MED ORDER — METOPROLOL TARTRATE 5 MG/5ML IV SOLN
5.0000 mg | Freq: Four times a day (QID) | INTRAVENOUS | Status: DC | PRN
  Administered 2016-12-02: 5 mg via INTRAVENOUS
  Filled 2016-12-02 (×2): qty 5

## 2016-12-02 MED ORDER — SODIUM CHLORIDE 0.9 % IV SOLN
INTRAVENOUS | Status: DC
  Administered 2016-12-02 (×2): via INTRAVENOUS

## 2016-12-02 NOTE — ED Triage Notes (Signed)
Per EMS: Pt from [facility] facility. Pt normally A&Ox4. Pt has had fever today and is altered today. Tylenol lat at 0100. Pt afib on monitor w/ history. Pulse 20- 80. BP 80/50. Hx of COPD.

## 2016-12-02 NOTE — Progress Notes (Signed)
ABG collected  

## 2016-12-02 NOTE — Progress Notes (Signed)
Pharmacy Antibiotic Note  Cindy Trevino is a 83 y.o. female admitted on 12/02/2016 with sepsis.  Pharmacy has been consulted for Vancomycin and Zosyn dosing.  SCr up to 2.87 (baseline 1.2 ~30mos ago)  Plan: Zosyn 2.25gm IV q8h Vancomycin 1gm IV q48h Will f/u micro data, renal function, and pt's clinical condition Vanc trough prn     Temp (24hrs), Avg:100.2 F (37.9 C), Min:100.2 F (37.9 C), Max:100.2 F (37.9 C)  No results for input(s): WBC, CREATININE, LATICACIDVEN, VANCOTROUGH, VANCOPEAK, VANCORANDOM, GENTTROUGH, GENTPEAK, GENTRANDOM, TOBRATROUGH, TOBRAPEAK, TOBRARND, AMIKACINPEAK, AMIKACINTROU, AMIKACIN in the last 168 hours.  CrCl cannot be calculated (Patient's most recent lab result is older than the maximum 21 days allowed.).    No Known Allergies  Antimicrobials this admission: 6/5 Vanc >>  6/5 Zosyn >>   Dose adjustments this admission: n/a  Microbiology results: 6/5 BCx x2:  6/5 UCx:    Thank you for allowing pharmacy to be a part of this patient's care.  Sherlon Handing, PharmD, BCPS Clinical pharmacist, pager [phone] 12/02/2016 5:52 AM

## 2016-12-02 NOTE — ED Provider Notes (Signed)
[facility] DEPT Provider Note   CSN: [id number] Arrival date & time: 12/02/16  0037     History   Chief Complaint Chief Complaint  Patient presents with  . Altered Mental Status    HPI Cindy Trevino is a 83 y.o. female.  Patient sent to the emergency department by ambulance from [facility] skilled nursing facility. Patient reportedly had been running a fever all day. She has been getting intermittent doses of Tylenol, last dose at 1 AM. Patient normally awake and alert, able to hold a conversation. She has not been responding to verbal stimuli.  Patient reportedly hypoxic at seen. EMS report a room air oxygen saturation of 80%. She was placed on oxygen at the scene. Patient also hypotensive with blood pressure of 80/50 initially. She was given an IV fluid bolus but only had a small improvement in her blood pressure.  Level V Caveat due to non-verbal, acuity of condition      Past Medical History:  Diagnosis Date  . Arthritis   . COPD (chronic obstructive pulmonary disease) ([place])   . Dementia 02/02/2012  . History of breast cancer 2009   Left breast  s/p lumpectomy and rads. declined hormonal tx. last mammogram 2011  . Hyperlipidemia   . Hypertension   . SVT (supraventricular tachycardia) [facility])     Patient Active Problem List   Diagnosis Date Noted  . Macular degeneration 09/06/2016  . Depression 04/23/2016  . Poor appetite 10/11/2015  . Loss of weight 10/11/2015  . Diarrhea 09/10/2015  . Anxiety 07/04/2015  . Mood disorder ([place]) 07/04/2015  . DVT of lower extremity (deep venous thrombosis) ([place]) 01/23/2015  . OA (osteoarthritis) of knee 09/20/2014  . CKD (chronic kidney disease) stage 3, GFR 30-59 ml/min 08/04/2014  . Anemia of chronic disease 08/04/2014  . Hyperlipidemia   . Essential hypertension 04/20/2014  . Vascular dementia with behavior disturbance 04/20/2014  . Paroxysmal atrial fibrillation ([place]) 04/20/2014  . Basal ganglia infarction ([place])  04/17/2014  . History of breast cancer 08/23/2012    Past Surgical History:  Procedure Laterality Date  . BREAST LUMPECTOMY Left 2009   left breast  . CESAREAN SECTION    . EYE SURGERY     cataracts x3    OB History    Gravida Para Term Preterm AB Living   1       1 2    SAB TAB Ectopic Multiple Live Births   1               Home Medications    Prior to Admission medications   Medication Sig Start Date End Date Taking? Authorizing Provider  acetaminophen (TYLENOL) 325 MG tablet Take 650 mg by mouth every 6 (six) hours as needed (for pain). Notify MD if pain is not relieved. Do not exceed 3000 mg in 24 hour period   Yes [provider]  Amino Acids-Protein Hydrolys (FEEDING SUPPLEMENT, PRO-STAT SUGAR FREE 64,) LIQD Take 30 mLs by mouth daily at 6 PM.    Yes [provider]  apixaban (ELIQUIS) 2.5 MG TABS tablet Take 2.5 mg by mouth 2 (two) times daily.   Yes [provider]  atorvastatin (LIPITOR) 40 MG tablet Take 1 tablet (40 mg total) by mouth daily at 6 PM. 04/21/14  Yes Kelby Aline, MD  bisacodyl (DULCOLAX) 10 MG suppository Place 10 mg rectally as needed for moderate constipation.   Yes [provider]  feeding supplement, ENSURE, (ENSURE) PUDG Take 1 Container by  mouth 2 (two) times daily between meals.   Yes [provider]  LORazepam (ATIVAN) 0.5 MG tablet Take 0.5 tablets (0.25 mg total) by mouth daily at 12 noon. 11/21/16  Yes Eulas Post, Monica, DO  memantine (NAMENDA) 10 MG tablet Take 10 mg by mouth 2 (two) times daily.   Yes [provider]  metoprolol tartrate (LOPRESSOR) 25 MG tablet Take 1 tablet (25 mg total) by mouth 3 (three) times daily. 04/21/14  Yes Kelby Aline, MD  mirtazapine (REMERON) 15 MG tablet Take 1/2 tablet ( 7.5 mg ) by mouth at bedtime    Yes [provider]  Multiple Vitamins-Minerals (PRESERVISION AREDS 2 PO) Take 1 tablet by mouth 2 (two) times daily.    Yes [provider]  promethazine (PHENERGAN) 25 MG/ML injection Inject 25 mg into the vein every 6 (six) hours as needed for nausea or vomiting.   Yes [provider]  Propylene Glycol 0.6 % SOLN Place 1 drop into both eyes 2 (two) times daily.   Yes [provider]  senna (SENOKOT) 8.6 MG TABS tablet Take 1 tablet by mouth daily.   Yes [provider]  Sodium Phosphates (RA SALINE ENEMA) 19-7 GM/118ML ENEM Place 1 each rectally as needed (for constipation).   Yes [provider]  traMADol (ULTRAM) 50 MG tablet Take 1 tablet (50 mg total) by mouth every 6 (six) hours as needed for moderate pain or severe pain. 09/05/15  Yes Gildardo Cranker, DO  Ferrous Sulfate 220 (44 Fe) MG/5ML LIQD Take 7.5 mLs by mouth daily. 330 mg    [provider]    Family History Family History  Problem Relation Age of Onset  . Heart disease Mother   . Diabetes Mother   . Heart disease Father   . Diabetes Sister   . Hypertension Sister     Social History Social History  Substance Use Topics  . Smoking status: Never Smoker  . Smokeless tobacco: Never Used  . Alcohol use No     Allergies   Patient has no known allergies.   Review of Systems Review of Systems  Unable to perform ROS: Patient nonverbal     Physical Exam Updated Vital Signs BP (!) 80/56   Pulse 76   Temp 100.2 F (37.9 C) (Rectal)   Resp (!) 21   Wt 77.1 kg (170 lb)   SpO2 100%   BMI 31.09 kg/m   Physical Exam  Constitutional: She appears well-developed and well-nourished. She appears lethargic. No distress.  HENT:  Head: Normocephalic and atraumatic.  Right Ear: Hearing normal.  Left Ear: Hearing normal.  Nose: Nose normal.  Mouth/Throat: Oropharynx is clear and moist and mucous membranes are normal.  Eyes: Conjunctivae and EOM are normal. Pupils are equal, round, and reactive to light.  Neck: Normal range of motion. Neck supple.  Cardiovascular: Regular rhythm, S1 normal and S2 normal.  Exam  reveals no gallop and no friction rub.   No murmur heard. Pulmonary/Chest: Accessory muscle usage present. Tachypnea noted. No respiratory distress. She has decreased breath sounds. She has rhonchi. She has rales. She exhibits no tenderness.  Abdominal: Soft. Normal appearance and bowel sounds are normal. There is no hepatosplenomegaly. There is no tenderness. There is no rebound, no guarding, no tenderness at McBurney's point and negative Murphy's sign. No hernia.  Musculoskeletal: Normal range of motion.  Neurological: She has normal strength. She appears lethargic. No cranial nerve deficit or sensory deficit. Coordination normal. GCS eye  subscore is 4. GCS verbal subscore is 5. GCS motor subscore is 6.  Skin: Skin is warm, dry and intact. No rash noted. No cyanosis.  Psychiatric: She is noncommunicative.  Nursing note and vitals reviewed.    ED Treatments / Results  Labs (all labs ordered are listed, but only abnormal results are displayed) Labs Reviewed  COMPREHENSIVE METABOLIC PANEL - Abnormal; Notable for the following:       Result Value   Chloride 100 (*)    Glucose, Bld 137 (*)    BUN 43 (*)    Creatinine, Ser 2.87 (*)    Calcium 8.2 (*)    Total Protein 6.3 (*)    Albumin 2.3 (*)    ALT 13 (*)    GFR calc non Af Amer 13 (*)    GFR calc Af Amer 16 (*)    All other components within normal limits  CBC WITH DIFFERENTIAL/PLATELET - Abnormal; Notable for the following:    WBC 21.5 (*)    RBC 3.81 (*)    Hemoglobin 10.5 (*)    HCT 34.1 (*)    RDW 16.8 (*)    Neutro Abs 17.8 (*)    All other components within normal limits  URINALYSIS, ROUTINE W REFLEX MICROSCOPIC - Abnormal; Notable for the following:    APPearance TURBID (*)    Hgb urine dipstick SMALL (*)    Protein, ur 100 (*)    Leukocytes, UA SMALL (*)    Bacteria, UA MANY (*)    All other components within normal limits  PROTIME-INR - Abnormal; Notable for the following:    Prothrombin Time 21.3 (*)    All  other components within normal limits  I-STAT ARTERIAL BLOOD GAS, ED - Abnormal; Notable for the following:    Acid-base deficit 3.0 (*)    All other components within normal limits  CULTURE, BLOOD (ROUTINE X 2)  CULTURE, BLOOD (ROUTINE X 2)  URINE CULTURE  I-STAT CG4 LACTIC ACID, ED    EKG  EKG Interpretation  Date/Time:  Tuesday December 02 2016 05:36:06 EDT Ventricular Rate:  87 PR Interval:    QRS Duration: 94 QT Interval:  398 QTC Calculation: 479 R Axis:   53 Text Interpretation:  Sinus rhythm Low voltage, extremity and precordial leads Confirmed by Orpah Greek ([id number]) on 12/02/2016 6:21:17 AM       Radiology Dg Chest Port 1 View  Result Date: 12/02/2016 CLINICAL DATA:  83 year old female with shortness of breath. Breast cancer post lumpectomy and radiation. Initial encounter. EXAM: PORTABLE CHEST 1 VIEW COMPARISON:  07/13/2010. FINDINGS: Basilar subsegmental atelectasis greater on the left. No pulmonary edema or pneumothorax. Tiny nodular densities left lung apex may be present versus confluence of shadows. Attention to this on follow-up. Mild cardiomegaly. Slightly tortuous aorta. Acromioclavicular joint degenerative changes. IMPRESSION: Basilar subsegmental atelectasis greater on the left. No pulmonary edema. Tiny nodular densities left lung apex may be present versus confluence of shadows. Attention to this on follow-up. Mild cardiomegaly. Electronically Signed   By: Genia Del M.D.   On: 12/02/2016 06:34    Procedures Procedures (including critical care time)  Medications Ordered in ED Medications  sodium chloride 0.9 % bolus 1,000 mL (not administered)  piperacillin-tazobactam (ZOSYN) IVPB 2.25 g (not administered)  vancomycin (VANCOCIN) IVPB 1000 mg/200 mL premix (not administered)  sodium chloride 0.9 % bolus 1,000 mL (0 mLs Intravenous Stopped 12/02/16 0646)    And  sodium chloride 0.9 % bolus 1,000 mL (1,000 mLs Intravenous  New Bag/Given 12/02/16 0615)     And  sodium chloride 0.9 % bolus 500 mL (500 mLs Intravenous New Bag/Given 12/02/16 0646)  piperacillin-tazobactam (ZOSYN) IVPB 3.375 g (3.375 g Intravenous New Bag/Given 12/02/16 0605)  vancomycin (VANCOCIN) IVPB 1000 mg/200 mL premix (1,000 mg Intravenous New Bag/Given 12/02/16 0605)     Initial Impression / Assessment and Plan / ED Course  I have reviewed the triage vital signs and the nursing notes.  Pertinent labs & imaging results that were available during my care of the patient were reviewed by me and considered in my medical decision making (see chart for details).     Patient presents to the emergency department for evaluation of altered mental status. Patient sent to the ER from nursing home. She reportedly has been running a fever all day, receiving Tylenol. She progressively worsened, prompting her to present to the ER. Patient has been hypotensive and hypoxic. She was found to be febrile at arrival. Sepsis protocol was initiated. She was given fluid based IV fluids, empiric broad-spectrum antibiotic coverage.  Lung examination revealed increase work of breathing. Her chest x-ray was clear, but with her altered mental status, I am concerned about possible aspiration. Additionally, in and out catheterization yielded frankly purulent urine. This is the source of infection.  Blood pressure improving slightly with 30 mL per kilogram IV fluid bolus. She is now 90 systolic. At her most recent office visit blood pressure was 127/80. Her lactate was normal. She'll be given additional fluid and will require hospitalization.  Discussed briefly with Dr. Ashok Cordia, ICU. Does not feel pressors necessary at this time. Recommends additional 1-2 liters of fluid, hospitalist admission to SDU appropriate.  CRITICAL CARE Performed by: Orpah Greek   Total critical care time: 30 minutes  Critical care time was exclusive of separately billable procedures and treating other patients.  Critical  care was necessary to treat or prevent imminent or life-threatening deterioration.  Critical care was time spent personally by me on the following activities: development of treatment plan with patient and/or surrogate as well as nursing, discussions with consultants, evaluation of patient's response to treatment, examination of patient, obtaining history from patient or surrogate, ordering and performing treatments and interventions, ordering and review of laboratory studies, ordering and review of radiographic studies, pulse oximetry and re-evaluation of patient's condition.   Final Clinical Impressions(s) / ED Diagnoses   Final diagnoses:  Sepsis, due to unspecified organism [facility])  Encephalopathy  Urinary tract infection without hematuria, site unspecified    New Prescriptions New Prescriptions   No medications on file     Orpah Greek, MD 12/02/16 [phone]

## 2016-12-02 NOTE — Progress Notes (Signed)
Critical lactic acid called from lab. Rn paged Dr. Marily Memos results. Waiting for any further orders.

## 2016-12-02 NOTE — Discharge Instructions (Signed)

## 2016-12-02 NOTE — H&P (Addendum)
History and Physical    [place] GLO:[id number] DOB: [birth year] DOA: 12/02/2016  PCP: Hennie Duos, MD Patient coming from: SNF - [facility]  Chief Complaint: unresponsive  HPI: Cindy Trevino is a 83 y.o. female with medical history significant of COPD, PAF, advanced dementia, breast cancer, hyperlipidemia, hypertension, SVT.  Level V caveat applies patient is unable to provide a reliable history. Patient presented from [facility] skilled nursing facility. Per report provided by SNF staff patient had been running fevers throughout the day on the day prior to admission. This was relieved with intermittent Tylenol dosing. Per patient's grandson who knows her well patient started showing signs of being out of her normal state of health approximately 3 days ago when he went to visit her. They went in the early evening and patient was already in bed which is unusual for her. She was not actually having her typical milkshake at that time. At baseline patient is awake and alert and able to hold a simple conversation. She became fairly unresponsive and EMS was called. Patient was noted to be hypoxic at time of arrival with O2 saturations around 80%. This improved with nasal cannula oxygen. Patient also noted to be hypotensive by EMS with a blood pressure reading of 80/50 initially.  There are no reported complaints of abdominal pain, chest pain, shortness of breath, palpitations, cough, nausea, vomiting, dysuria, frequency, neck stiffness or headache or syncope. Of note patient wears depends as she has issues with incontinence of bowel and stool.   ED Course: Sepsis protocol initiated with aggressive fluid boluses and antibiotics.  Review of Systems: As per HPI otherwise all other systems reviewed and are negative  Ambulatory Status: Requires assistance with limited ambulation.  Past Medical History:  Diagnosis Date  . Arthritis   . COPD (chronic obstructive pulmonary disease) ([place])     . Dementia 02/02/2012  . History of breast cancer 2009   Left breast  s/p lumpectomy and rads. declined hormonal tx. last mammogram 2011  . Hyperlipidemia   . Hypertension   . PAF (paroxysmal atrial fibrillation) ([place])   . SVT (supraventricular tachycardia) ([facility])     Past Surgical History:  Procedure Laterality Date  . BREAST LUMPECTOMY Left 2009   left breast  . CESAREAN SECTION    . EYE SURGERY     cataracts x3    Social History   Social History  . Marital status: Single    Spouse name: N/A  . Number of children: N/A  . Years of education: N/A   Occupational History  . Not on file.   Social History Main Topics  . Smoking status: Never Smoker  . Smokeless tobacco: Never Used  . Alcohol use No  . Drug use: No  . Sexual activity: Not on file   Other Topics Concern  . Not on file   Social History Narrative   ** Merged History Encounter **        No Known Allergies  Family History  Problem Relation Age of Onset  . Heart disease Mother   . Diabetes Mother   . Heart disease Father   . Diabetes Sister   . Hypertension Sister     Prior to Admission medications   Medication Sig Start Date End Date Taking? Authorizing Provider  acetaminophen (TYLENOL) 325 MG tablet Take 650 mg by mouth every 6 (six) hours as needed (for pain). Notify MD if pain is not relieved. Do not exceed 3000 mg in 24 hour  period   Yes [provider]  Amino Acids-Protein Hydrolys (FEEDING SUPPLEMENT, PRO-STAT SUGAR FREE 64,) LIQD Take 30 mLs by mouth daily at 6 PM.    Yes [provider]  apixaban (ELIQUIS) 2.5 MG TABS tablet Take 2.5 mg by mouth 2 (two) times daily.   Yes [provider]  atorvastatin (LIPITOR) 40 MG tablet Take 1 tablet (40 mg total) by mouth daily at 6 PM. 04/21/14  Yes Kelby Aline, MD  bisacodyl (DULCOLAX) 10 MG suppository Place 10 mg rectally as needed for moderate constipation.   Yes [provider]  feeding supplement, ENSURE,  (ENSURE) PUDG Take 1 Container by mouth 2 (two) times daily between meals.   Yes [provider]  LORazepam (ATIVAN) 0.5 MG tablet Take 0.5 tablets (0.25 mg total) by mouth daily at 12 noon. 11/21/16  Yes Eulas Post, Monica, DO  memantine (NAMENDA) 10 MG tablet Take 10 mg by mouth 2 (two) times daily.   Yes [provider]  metoprolol tartrate (LOPRESSOR) 25 MG tablet Take 1 tablet (25 mg total) by mouth 3 (three) times daily. 04/21/14  Yes Kelby Aline, MD  mirtazapine (REMERON) 15 MG tablet Take 1/2 tablet ( 7.5 mg ) by mouth at bedtime    Yes [provider]  Multiple Vitamins-Minerals (PRESERVISION AREDS 2 PO) Take 1 tablet by mouth 2 (two) times daily.    Yes [provider]  promethazine (PHENERGAN) 25 MG/ML injection Inject 25 mg into the vein every 6 (six) hours as needed for nausea or vomiting.   Yes [provider]  Propylene Glycol 0.6 % SOLN Place 1 drop into both eyes 2 (two) times daily.   Yes [provider]  senna (SENOKOT) 8.6 MG TABS tablet Take 1 tablet by mouth daily.   Yes [provider]  Sodium Phosphates (RA SALINE ENEMA) 19-7 GM/118ML ENEM Place 1 each rectally as needed (for constipation).   Yes [provider]  traMADol (ULTRAM) 50 MG tablet Take 1 tablet (50 mg total) by mouth every 6 (six) hours as needed for moderate pain or severe pain. 09/05/15  Yes Gildardo Cranker, DO  Ferrous Sulfate 220 (44 Fe) MG/5ML LIQD Take 7.5 mLs by mouth daily. 330 mg    [provider]    Physical Exam: Vitals:   12/02/16 0715 12/02/16 0730 12/02/16 0745 12/02/16 0800  BP: (!) 79/48 (!) 103/52 (!) 99/41 106/74  Pulse: 76 72 71 76  Resp: 12 19 (!) 21 19  Temp:      TempSrc:      SpO2: 100% 100% 100% 99%  Weight:         General:  Appears calm and comfortable Eyes:  PERRL, EOMI, normal lids, iris ENT: Dry mucous membranes, grossly normal hearing,  Neck:  no LAD, masses or thyromegaly Cardiovascular:   RRR, no m/r/g. No LE edema.  Respiratory:  CTA bilaterally, no w/r/r. Normal respiratory effort. Abdomen:  soft, ntnd, NABS Skin:  no rash or induration seen on limited exam Musculoskeletal:  grossly normal tone BUE/BLE, good ROM, no bony abnormality Psychiatric: Follows basic commands, speech fluent and appropriate. Alert and oriented 0. Neurologic:  CN 2-12 grossly intact, moves all extremities in coordinated fashion, sensation intact  Labs on Admission: I have personally reviewed following labs and imaging studies  CBC:  Recent Labs Lab 12/02/16 0556  WBC 21.5*  NEUTROABS 17.8*  HGB 10.5*  HCT 34.1*  MCV 89.5  PLT 893   Basic Metabolic Panel:  Recent Labs Lab 12/02/16 0556  NA 137  K 4.2  CL 100*  CO2 23  GLUCOSE 137*  BUN 43*  CREATININE 2.87*  CALCIUM 8.2*   GFR: Estimated Creatinine Clearance: 12.5 mL/min (A) (by C-G formula based on SCr of 2.87 mg/dL (H)). Liver Function Tests:  Recent Labs Lab 12/02/16 0556  AST 16  ALT 13*  ALKPHOS 74  BILITOT 0.9  PROT 6.3*  ALBUMIN 2.3*   No results for input(s): LIPASE, AMYLASE in the last 168 hours. No results for input(s): AMMONIA in the last 168 hours. Coagulation Profile:  Recent Labs Lab 12/02/16 0556  INR 1.82   Cardiac Enzymes: No results for input(s): CKTOTAL, CKMB, CKMBINDEX, TROPONINI in the last 168 hours. BNP (last 3 results) No results for input(s): PROBNP in the last 8760 hours. HbA1C: No results for input(s): HGBA1C in the last 72 hours. CBG: No results for input(s): GLUCAP in the last 168 hours. Lipid Profile: No results for input(s): CHOL, HDL, LDLCALC, TRIG, CHOLHDL, LDLDIRECT in the last 72 hours. Thyroid Function Tests: No results for input(s): TSH, T4TOTAL, FREET4, T3FREE, THYROIDAB in the last 72 hours. Anemia Panel: No results for input(s): VITAMINB12, FOLATE, FERRITIN, TIBC, IRON, RETICCTPCT in the last 72 hours. Urine analysis:    Component Value Date/Time   COLORURINE  YELLOW 12/02/2016 0548   APPEARANCEUR TURBID (A) 12/02/2016 0548   LABSPEC 1.012 12/02/2016 0548   PHURINE 6.0 12/02/2016 0548   GLUCOSEU NEGATIVE 12/02/2016 0548   HGBUR SMALL (A) 12/02/2016 0548   BILIRUBINUR NEGATIVE 12/02/2016 0548   KETONESUR NEGATIVE 12/02/2016 0548   PROTEINUR 100 (A) 12/02/2016 0548   UROBILINOGEN 0.2 04/17/2014 1340   NITRITE NEGATIVE 12/02/2016 0548   LEUKOCYTESUR SMALL (A) 12/02/2016 0548    Creatinine Clearance: Estimated Creatinine Clearance: 12.5 mL/min (A) (by C-G formula based on SCr of 2.87 mg/dL (H)).  Sepsis Labs: @LABRCNTIP (procalcitonin:4,lacticidven:4) )No results found for this or any previous visit (from the past 240 hour(s)).   Radiological Exams on Admission: Dg Chest Port 1 View  Result Date: 12/02/2016 CLINICAL DATA:  83 year old female with shortness of breath. Breast cancer post lumpectomy and radiation. Initial encounter. EXAM: PORTABLE CHEST 1 VIEW COMPARISON:  07/13/2010. FINDINGS: Basilar subsegmental atelectasis greater on the left. No pulmonary edema or pneumothorax. Tiny nodular densities left lung apex may be present versus confluence of shadows. Attention to this on follow-up. Mild cardiomegaly. Slightly tortuous aorta. Acromioclavicular joint degenerative changes. IMPRESSION: Basilar subsegmental atelectasis greater on the left. No pulmonary edema. Tiny nodular densities left lung apex may be present versus confluence of shadows. Attention to this on follow-up. Mild cardiomegaly. Electronically Signed   By: Genia Del M.D.   On: 12/02/2016 06:34    EKG: Independently reviewed. Sinus, no ACS  Assessment/Plan Active Problems:   Essential hypertension   Vascular dementia with behavior disturbance   Paroxysmal atrial fibrillation ([facility])   Hyperlipidemia   Depression   Sepsis, unspecified organism ([place])   AKI (acute kidney injury) ([place])   Chronic diastolic CHF (congestive heart failure) ([place])   Protein calorie malnutrition  ([place])   Sepsis: Likely secondary to urologic etiology and Pyelonephritis. Pressure initially 79/37 with creatinine 2.87, WBC 21.5, confused, febrile, tachypnea. Chest x-ray normal. UA grossly abnormal. Pt responding well to ABX and fluids and lactic acid only 1.3.  - Vanc Zosyn started in ED. Will continue w/ only Zosyn at this time.  - Sepsis order set - Follow-up blood cultures and urine cultures - Pressure remains soft so will continue  aggressive IV hydration  AKI: Cr 2.87. Baseline 1.2. Likely secondary to dehydration and renal injury from sepsis/pyelonephritis. - Treatment as above - BMP in a.m.  Acute encephalopathy: Likely due to a combination of advanced dementia and sepsis. Patient's family aware that her mental status may never return to her baseline prior to admission. Currently patient is more awake and alert than time of presentation to the ED. At baseline patient is able to hold a conversation and be interactive though she may be confused about the details of the conversation. - Treatment of sepsis as above - Continue Namenda  Physical deconditioning/protein calorie malnutrition: Patient with poor oral intake and minimal physical reserve. - Continue ensure, Prostat - PT/OT  Chronic diastolic congestive heart failure: Last echo showing an EF of 50-55% and grade 1 diastolic dysfunction. No evidence of current volume overload the patient receiving very aggressive IV hydration. - Strict I's and O's, daily weights - Resume beta blocker when able  Depression: - continue Remeron  Chronic pain: - continue tramadol prn  Afib: rate controlled - Continue Eliquis - Hold [place] due to profound hypotension - tele  HLD: - continue statin   DVT prophylaxis: Eliquis  Code Status: DNR  Family Communication: grandson  Disposition Plan: pending improvement   Consults called: none  Admission status: inpt    >70 min spent in direct patient care and coordination of care of this  critically ill patient.  Amarisa Wilinski J MD [facility]  If 7PM-7AM, please contact night-coverage [email or web address] Password [email or web address]  12/02/2016, 8:26 AM

## 2016-12-03 DIAGNOSIS — N390 Urinary tract infection, site not specified: Secondary | ICD-10-CM

## 2016-12-03 DIAGNOSIS — N179 Acute kidney failure, unspecified: Secondary | ICD-10-CM

## 2016-12-03 DIAGNOSIS — G9340 Encephalopathy, unspecified: Secondary | ICD-10-CM

## 2016-12-03 DIAGNOSIS — I5032 Chronic diastolic (congestive) heart failure: Secondary | ICD-10-CM

## 2016-12-03 DIAGNOSIS — A419 Sepsis, unspecified organism: Secondary | ICD-10-CM

## 2016-12-03 LAB — CBC
HEMATOCRIT: 32.3 % — AB (ref 36.0–46.0)
Hemoglobin: 9.7 g/dL — ABNORMAL LOW (ref 12.0–15.0)
MCH: 27 pg (ref 26.0–34.0)
MCHC: 30 g/dL (ref 30.0–36.0)
MCV: 90 fL (ref 78.0–100.0)
PLATELETS: 258 10*3/uL (ref 150–400)
RBC: 3.59 MIL/uL — ABNORMAL LOW (ref 3.87–5.11)
RDW: 16.9 % — AB (ref 11.5–15.5)
WBC: 13.1 10*3/uL — ABNORMAL HIGH (ref 4.0–10.5)

## 2016-12-03 LAB — BASIC METABOLIC PANEL
Anion gap: 11 (ref 5–15)
BUN: 33 mg/dL — AB (ref 6–20)
BUN: 33 — AB (ref 4–21)
CHLORIDE: 107 mmol/L (ref 101–111)
CO2: 18 mmol/L — ABNORMAL LOW (ref 22–32)
CREATININE: 2.28 mg/dL — AB (ref 0.44–1.00)
Calcium: 7.2 mg/dL — ABNORMAL LOW (ref 8.9–10.3)
Creatinine: 2.3 — AB (ref 0.5–1.1)
GFR calc Af Amer: 21 mL/min — ABNORMAL LOW (ref >60)
GFR calc non Af Amer: 18 mL/min — ABNORMAL LOW (ref >60)
GLUCOSE: 131 mg/dL — AB (ref 65–99)
Glucose: 131
POTASSIUM: 3.9 mmol/L (ref 3.5–5.1)
Potassium: 3.9 (ref 3.4–5.3)
SODIUM: 136 — AB (ref 137–147)
SODIUM: 136 mmol/L (ref 135–145)

## 2016-12-03 LAB — CBC AND DIFFERENTIAL
HEMATOCRIT: 32 — AB (ref 36–46)
HEMOGLOBIN: 9.7 — AB (ref 12.0–16.0)
Platelets: 258 (ref 150–399)
WBC: 13.1

## 2016-12-03 LAB — LACTIC ACID, PLASMA: Lactic Acid, Venous: 1 mmol/L (ref 0.5–1.9)

## 2016-12-03 MED ORDER — SODIUM CHLORIDE 0.9 % IV SOLN
INTRAVENOUS | Status: DC
  Administered 2016-12-03 – 2016-12-05 (×3): via INTRAVENOUS

## 2016-12-03 MED ORDER — PROPYLENE GLYCOL 0.6 % OP SOLN: 1.0000 [drp] | Freq: Two times a day (BID) | OPHTHALMIC | Status: DC

## 2016-12-03 MED ORDER — SODIUM CHLORIDE 0.9 % IV BOLUS (SEPSIS)
500.0000 mL | Freq: Once | INTRAVENOUS | Status: AC
  Administered 2016-12-03: 500 mL via INTRAVENOUS

## 2016-12-03 MED ORDER — POLYVINYL ALCOHOL 1.4 % OP SOLN: 1.0000 [drp] | OPHTHALMIC | Status: DC | PRN

## 2016-12-03 MED ORDER — BISACODYL 10 MG RE SUPP: 10.0000 mg | RECTAL | Status: DC | PRN

## 2016-12-03 NOTE — Progress Notes (Addendum)
   12/03/16 0155 12/03/16 0200  Vitals  BP (!) 89/50 (!) 80/50  MAP (mmHg) (!) 62 (!) 60  Pulse Rate 76 77  ECG Heart Rate 83 (!) 131  Resp (!) 25 (!) 25  Oxygen Therapy  SpO2 97 % 92 %   Pts BPs slightly improved s/p 500cc bolus. Pt currently trendelenburged. NP kirby made aware of recent BPs. Orders for 2nd 500cc bolus received, will adminster and continue to monitor closely. Pt lung fields are clear and equal bilaterally at this time.

## 2016-12-03 NOTE — Progress Notes (Signed)
[place] TEAM 1 - [place] [address] [place]  TKZ:[id number] DOB: 1925/06/28 DOA: 12/02/2016 PCP: Hennie Duos, MD    Brief Narrative:  83 y.o. female with history of COPD, PAF, advanced dementia, breast cancer, hyperlipidemia, hypertension, and SVT who presented from [facility] after running fevers throughout the day and developing AMS.  In the ED she was obtunded, hypoxic in the 80s, and hypotensive w/ SBP in the 80s as well.  Subjective: Patient is resting comfortably.  She will open her eyes and answer some simple questions.  She does not appear to be in any respiratory distress.  Assessment & Plan:  Sepsis due to Klebsiella pneumoniae Pyelonephritis Continue empiric antibiotic therapy while sensitivities pending  Hypotension  Due to above - cont volume expansion - has hx of HTN - holding BP meds   Acute kidney injury  Cr 2.87 at presentation - baseline 1.2 - likely simple prerenal azotemia but can't yet rule out an element of ATN - follow with ongoing volume resuscitation  Recent Labs Lab 12/02/16 0556 12/03/16 0103  CREATININE 2.87* 2.28*   Acute encephalopathy Due to advanced dementia and sepsis - appears to be slowly improving  Physical deconditioning/protein calorie malnutrition  Chronic diastolic congestive heart failure EF of 50-55% w/  grade 1 diastolic dysfunction - no evidence of volume overload at this time  Depression continue Remeron  Chronic pain continue tramadol   Afib rate controlled - continue Eliquis  DVT prophylaxis: eliquis Code Status: NO CODE - DNR Family Communication: no family present at time of exam  Disposition Plan: SDU until BP more stable   Consultants:  none  Procedures: none  Antimicrobials:  Zosyn 6/4 > Vancomycin 6/4  Objective: Blood pressure 119/60, pulse 74, temperature 99.3 F (37.4 C), temperature source Oral, resp. rate 17, weight 72.2 kg (159 lb 2.8 oz), SpO2 100 %.  Intake/Output  Summary (Last 24 hours) at 12/03/16 1541 Last data filed at 12/03/16 1400  Gross per 24 hour  Intake             2425 ml  Output                0 ml  Net             2425 ml   Filed Weights   12/02/16 0617 12/03/16 0500  Weight: 77.1 kg (170 lb) 72.2 kg (159 lb 2.8 oz)    Examination: General: No acute respiratory distress Lungs: Clear to auscultation bilaterally without wheezes or crackles Cardiovascular: Regular rate and rhythm without murmur gallop or rub normal S1 and S2 Abdomen: Nontender, nondistended, soft, bowel sounds positive, no rebound, no ascites, no appreciable mass Extremities: No significant cyanosis, clubbing, or edema bilateral lower extremities  CBC:  Recent Labs Lab 12/02/16 0556 12/03/16 0103  WBC 21.5* 13.1*  NEUTROABS 17.8*  --   HGB 10.5* 9.7*  HCT 34.1* 32.3*  MCV 89.5 90.0  PLT 341 573   Basic Metabolic Panel:  Recent Labs Lab 12/02/16 0556 12/03/16 0103  NA 137 136  K 4.2 3.9  CL 100* 107  CO2 23 18*  GLUCOSE 137* 131*  BUN 43* 33*  CREATININE 2.87* 2.28*  CALCIUM 8.2* 7.2*   GFR: Estimated Creatinine Clearance: 15.2 mL/min (A) (by C-G formula based on SCr of 2.28 mg/dL (H)).  Liver Function Tests:  Recent Labs Lab 12/02/16 0556  AST 16  ALT 13*  ALKPHOS 74  BILITOT 0.9  PROT 6.3*  ALBUMIN 2.3*  Coagulation Profile:  Recent Labs Lab 12/02/16 0556  INR 1.82    HbA1C: Hemoglobin A1C  Date/Time Value Ref Range Status  08/12/2016 6.1  Final   Hgb A1c MFr Bld  Date/Time Value Ref Range Status  04/18/2014 07:10 AM 5.7 (H) <5.7 % Final    Comment:    (NOTE)                                                                       According to the ADA Clinical Practice Recommendations for 2011, when HbA1c is used as a screening test:  >=6.5%   Diagnostic of Diabetes Mellitus           (if abnormal result is confirmed) 5.7-6.4%   Increased risk of developing Diabetes Mellitus References:Diagnosis and Classification  of Diabetes Mellitus,Diabetes GQQP,6195,09(TOIZT 1):S62-S69 and Standards of Medical Care in         Diabetes - 2011,Diabetes IWPY,0998,33 (Suppl 1):S11-S61.     Recent Results (from the past 240 hour(s))  Urine culture     Status: Abnormal (Preliminary result)   Collection Time: 12/02/16  5:48 AM  Result Value Ref Range Status   Specimen Description URINE, CATHETERIZED  Final   Special Requests NONE  Final   Culture >=100,000 COLONIES/mL KLEBSIELLA PNEUMONIAE (A)  Final   Report Status PENDING  Incomplete  Blood Culture (routine x 2)     Status: None (Preliminary result)   Collection Time: 12/02/16  5:50 AM  Result Value Ref Range Status   Specimen Description BLOOD RIGHT ARM  Final   Special Requests   Final    BOTTLES DRAWN AEROBIC AND ANAEROBIC Blood Culture adequate volume   Culture NO GROWTH 1 DAY  Final   Report Status PENDING  Incomplete  Blood Culture (routine x 2)     Status: None (Preliminary result)   Collection Time: 12/02/16  6:00 AM  Result Value Ref Range Status   Specimen Description BLOOD LEFT ARM  Final   Special Requests IN PEDIATRIC BOTTLE Blood Culture adequate volume  Final   Culture NO GROWTH 1 DAY  Final   Report Status PENDING  Incomplete  MRSA PCR Screening     Status: None   Collection Time: 12/02/16  8:28 AM  Result Value Ref Range Status   MRSA by PCR NEGATIVE NEGATIVE Final    Comment:        The GeneXpert MRSA Assay (FDA approved for NASAL specimens only), is one component of a comprehensive MRSA colonization surveillance program. It is not intended to diagnose MRSA infection nor to guide or monitor treatment for MRSA infections.      Scheduled Meds: . apixaban  2.5 mg Oral BID  . atorvastatin  40 mg Oral q1800  . feeding supplement (PRO-STAT SUGAR FREE 64)  30 mL Oral q1800  . ferrous sulfate  330 mg Oral Daily  . memantine  10 mg Oral BID  . metoprolol tartrate  25 mg Oral TID  . mirtazapine  15 mg Oral QHS  . senna  1 tablet  Oral Daily   Continuous Infusions: . sodium chloride 75 mL/hr at 12/03/16 0523  . piperacillin-tazobactam (ZOSYN)  IV 2.25 g (12/03/16 1400)     LOS: 1 day  Cherene Altes, MD [facility] Office  [phone] Pager - Text Page per Amion as per below:  On-Call/Text Page:      [email or web address]      password [email or web address]  If 7PM-7AM, please contact night-coverage [email or web address] Password [email or web address] 12/03/2016, 3:41 PM

## 2016-12-03 NOTE — Progress Notes (Signed)
Grandson and HCPOA, Darrell, called for an update. Update on patient condition and plan of care provided, questions answered to grandsons verbalized satisfaction.

## 2016-12-03 NOTE — Progress Notes (Signed)
   12/03/16 0000 12/03/16 0015 12/03/16 0035  Vitals  BP (!) 70/47 (!) 78/36 (!) 73/45  MAP (mmHg) (!) 56 (!) 51 (!) 55  BP Location Left Arm Left Arm Right Leg  BP Method Automatic Automatic Automatic  Patient Position (if appropriate) Lying Lying Lying  Pulse Rate 76 74 77  ECG Heart Rate 77 74 75  Cardiac Rhythm NSR --  --   Resp (!) 22 (!) 25 (!) 27  Oxygen Therapy  SpO2 96 % 94 % (!) 87 %     12/03/16 0036  Vitals  BP (!) 71/45  MAP (mmHg) (!) 55  BP Location Right Arm  BP Method Automatic  Patient Position (if appropriate) Lying  Pulse Rate 75  ECG Heart Rate 75  Cardiac Rhythm --   Resp (!) 25  Oxygen Therapy  SpO2 (!) 88 %    NP Kirby made aware of pts persistent hypotension. Pt is sleeping and responds to stimuli, it is noted that pt did receive remeron and tramadol at the beginning of the shift, NP made aware of this as well. Orders for 500cc NS bolus ordered. Will administer and continue to monitor.

## 2016-12-04 LAB — CBC
HCT: 34.3 % — ABNORMAL LOW (ref 36.0–46.0)
HEMOGLOBIN: 10.5 g/dL — AB (ref 12.0–15.0)
MCH: 27.1 pg (ref 26.0–34.0)
MCHC: 30.6 g/dL (ref 30.0–36.0)
MCV: 88.6 fL (ref 78.0–100.0)
PLATELETS: 230 10*3/uL (ref 150–400)
RBC: 3.87 MIL/uL (ref 3.87–5.11)
RDW: 16.9 % — ABNORMAL HIGH (ref 11.5–15.5)
WBC: 10.8 10*3/uL — AB (ref 4.0–10.5)

## 2016-12-04 LAB — BASIC METABOLIC PANEL
ANION GAP: 10 (ref 5–15)
BUN: 27 mg/dL — ABNORMAL HIGH (ref 6–20)
BUN: 27 — AB (ref 4–21)
CHLORIDE: 106 mmol/L (ref 101–111)
CO2: 19 mmol/L — AB (ref 22–32)
CREATININE: 1.93 mg/dL — AB (ref 0.44–1.00)
Calcium: 7.6 mg/dL — ABNORMAL LOW (ref 8.9–10.3)
Creatinine: 1.9 — AB (ref 0.5–1.1)
GFR calc non Af Amer: 22 mL/min — ABNORMAL LOW (ref >60)
GFR, EST AFRICAN AMERICAN: 25 mL/min — AB (ref >60)
Glucose, Bld: 124 mg/dL — ABNORMAL HIGH (ref 65–99)
Glucose: 124
POTASSIUM: 4.1 (ref 3.4–5.3)
Potassium: 4.1 mmol/L (ref 3.5–5.1)
SODIUM: 135 mmol/L (ref 135–145)
Sodium: 135 — AB (ref 137–147)

## 2016-12-04 LAB — URINE CULTURE

## 2016-12-04 LAB — CBC AND DIFFERENTIAL
HCT: 34 — AB (ref 36–46)
HEMOGLOBIN: 10.5 — AB (ref 12.0–16.0)
Platelets: 230 (ref 150–399)
WBC: 10.8

## 2016-12-04 MED ORDER — DEXTROSE 5 % IV SOLN
1.0000 g | INTRAVENOUS | Status: AC
  Administered 2016-12-04 – 2016-12-06 (×3): 1 g via INTRAVENOUS
  Filled 2016-12-04 (×3): qty 10

## 2016-12-04 MED ORDER — METOPROLOL TARTRATE 12.5 MG HALF TABLET
12.5000 mg | ORAL_TABLET | Freq: Three times a day (TID) | ORAL | Status: DC
  Administered 2016-12-04 – 2016-12-07 (×9): 12.5 mg via ORAL
  Filled 2016-12-04 (×9): qty 1

## 2016-12-04 NOTE — Progress Notes (Signed)
Attempted report for the second time. Arelis Neumeier Lucila Maine RN, BSN MSN RN3. 12/04/2016 @ 17.30

## 2016-12-04 NOTE — Progress Notes (Signed)
Attempted to call report at [place] RN, BSN, MSN, RN3. 12/04/2016 @ 17.10

## 2016-12-04 NOTE — Plan of Care (Signed)
Problem: Education: Goal: Knowledge of [facility] Education information/materials will improve Outcome: Completed/Met Date Met: 12/04/16 Family aware  Problem: Health Behavior/Discharge Planning: Goal: Ability to manage health-related needs will improve Outcome: Not Progressing Patient unable to care for self in extended care  Problem: Education: Goal: Knowledge of treatment and prevention of UTI/Pyleonephritis will improve Outcome: Progressing Family awareness

## 2016-12-04 NOTE — Progress Notes (Signed)
Patient arrived to 6n23 alert but would not answer any questions. IV fluids running, VSS, placed new purewick onto patient. Made patient comfortable, told her how to call us if she needed help, will continue to follow. No family/visitors at the moment.

## 2016-12-04 NOTE — Progress Notes (Signed)
[place] TEAM 1 - Elizabeth W Pascual  TOI:[id number] DOB: 1925/03/22 DOA: 12/02/2016 PCP: Hennie Duos, MD    Brief Narrative:  83 y.o. female with history of COPD, PAF, advanced dementia, breast cancer, hyperlipidemia, hypertension, and SVT who presented from [facility] after running fevers throughout the day and developing AMS.  In the ED she was obtunded, hypoxic in the 80s, and hypotensive w/ SBP in the 80s as well.  Subjective: The patient is more alert today.  She tells me she is feeling much better.  She is quite pleasant.  She denies chest pain shortness breath nausea vomiting or abdominal pain.  Assessment & Plan:  Sepsis due to Klebsiella pneumoniae Pyelonephritis Narrow antibiotic to Rocephin - clinically improving with downward temperature trend and improving WBC  Hypotension  Due to above - much improved overall with some episodes of mild hypertension - continue to hold BP meds  Acute kidney injury  Cr 2.87 at presentation - baseline 1.2 - likely simple prerenal azotemia but can't yet rule out an element of ATN - follow with ongoing volume resuscitation  Recent Labs Lab 12/02/16 0556 12/03/16 0103 12/04/16 0314  CREATININE 2.87* 2.28* 1.93*   Acute encephalopathy Due to advanced dementia and sepsis - improving steadily   Physical deconditioning/protein calorie malnutrition  Chronic diastolic congestive heart failure EF of 50-55% w/  grade 1 diastolic dysfunction - no evidence of volume overload at this time  Depression continue Remeron  Chronic pain continue tramadol   Afib rate controlled - continue Eliquis  DVT prophylaxis: eliquis Code Status: NO CODE - DNR Family Communication: no family present at time of exam  Disposition Plan: stable for transfer to medical bed - PT/OT evals - eventual return to SNF   Consultants:  none  Procedures: none  Antimicrobials:  Zosyn 6/4 > 6/7 Rocephin 6/7 > Vancomycin  6/4  Objective: Blood pressure (!) 89/59, pulse (!) 40, temperature 100.2 F (37.9 C), temperature source Axillary, resp. rate (!) 21, height 5\' 3"  (1.6 m), weight 74.9 kg (165 lb 2 oz), SpO2 96 %.  Intake/Output Summary (Last 24 hours) at 12/04/16 1407 Last data filed at 12/04/16 1200  Gross per 24 hour  Intake             1300 ml  Output             1250 ml  Net               50 ml   Filed Weights   12/03/16 0500 12/04/16 0300 12/04/16 0600  Weight: 72.2 kg (159 lb 2.8 oz) 74.9 kg (165 lb 2 oz) 74.9 kg (165 lb 2 oz)    Examination: General: No acute respiratory distress Lungs: Clear to auscultation bilaterally - no wheezing  Cardiovascular: Regular rate and rhythm - no gallup  Abdomen: Nontender, nondistended, soft, bowel sounds positive, no rebound, overweight  Extremities: No significant edema bilateral lower extremities  CBC:  Recent Labs Lab 12/02/16 0556 12/03/16 0103 12/04/16 0314  WBC 21.5* 13.1* 10.8*  NEUTROABS 17.8*  --   --   HGB 10.5* 9.7* 10.5*  HCT 34.1* 32.3* 34.3*  MCV 89.5 90.0 88.6  PLT 341 258 833   Basic Metabolic Panel:  Recent Labs Lab 12/02/16 0556 12/03/16 0103 12/04/16 0314  NA 137 136 135  K 4.2 3.9 4.1  CL 100* 107 106  CO2 23 18* 19*  GLUCOSE 137* 131* 124*  BUN 43* 33* 27*  CREATININE  2.87* 2.28* 1.93*  CALCIUM 8.2* 7.2* 7.6*   GFR: Estimated Creatinine Clearance: 18.8 mL/min (A) (by C-G formula based on SCr of 1.93 mg/dL (H)).  Liver Function Tests:  Recent Labs Lab 12/02/16 0556  AST 16  ALT 13*  ALKPHOS 74  BILITOT 0.9  PROT 6.3*  ALBUMIN 2.3*    Coagulation Profile:  Recent Labs Lab 12/02/16 0556  INR 1.82    HbA1C: Hemoglobin A1C  Date/Time Value Ref Range Status  08/12/2016 6.1  Final   Hgb A1c MFr Bld  Date/Time Value Ref Range Status  04/18/2014 07:10 AM 5.7 (H) <5.7 % Final    Comment:    (NOTE)                                                                       According to the ADA  Clinical Practice Recommendations for 2011, when HbA1c is used as a screening test:  >=6.5%   Diagnostic of Diabetes Mellitus           (if abnormal result is confirmed) 5.7-6.4%   Increased risk of developing Diabetes Mellitus References:Diagnosis and Classification of Diabetes Mellitus,Diabetes GBTD,1761,60(VPXTG 1):S62-S69 and Standards of Medical Care in         Diabetes - 2011,Diabetes GYIR,4854,62 (Suppl 1):S11-S61.     Recent Results (from the past 240 hour(s))  Urine culture     Status: Abnormal   Collection Time: 12/02/16  5:48 AM  Result Value Ref Range Status   Specimen Description URINE, CATHETERIZED  Final   Special Requests NONE  Final   Culture >=100,000 COLONIES/mL KLEBSIELLA PNEUMONIAE (A)  Final   Report Status 12/04/2016 FINAL  Final   Organism ID, Bacteria KLEBSIELLA PNEUMONIAE (A)  Final      Susceptibility   Klebsiella pneumoniae - MIC*    AMPICILLIN >=32 RESISTANT Resistant     CEFAZOLIN <=4 SENSITIVE Sensitive     CEFTRIAXONE <=1 SENSITIVE Sensitive     CIPROFLOXACIN <=0.25 SENSITIVE Sensitive     GENTAMICIN <=1 SENSITIVE Sensitive     IMIPENEM <=0.25 SENSITIVE Sensitive     NITROFURANTOIN 64 INTERMEDIATE Intermediate     TRIMETH/SULFA <=20 SENSITIVE Sensitive     AMPICILLIN/SULBACTAM 4 SENSITIVE Sensitive     PIP/TAZO <=4 SENSITIVE Sensitive     Extended ESBL NEGATIVE Sensitive     * >=100,000 COLONIES/mL KLEBSIELLA PNEUMONIAE  Blood Culture (routine x 2)     Status: None (Preliminary result)   Collection Time: 12/02/16  5:50 AM  Result Value Ref Range Status   Specimen Description BLOOD RIGHT ARM  Final   Special Requests   Final    BOTTLES DRAWN AEROBIC AND ANAEROBIC Blood Culture adequate volume   Culture NO GROWTH 1 DAY  Final   Report Status PENDING  Incomplete  Blood Culture (routine x 2)     Status: None (Preliminary result)   Collection Time: 12/02/16  6:00 AM  Result Value Ref Range Status   Specimen Description BLOOD LEFT ARM  Final    Special Requests IN PEDIATRIC BOTTLE Blood Culture adequate volume  Final   Culture NO GROWTH 1 DAY  Final   Report Status PENDING  Incomplete  MRSA PCR Screening     Status: None   Collection  Time: 12/02/16  8:28 AM  Result Value Ref Range Status   MRSA by PCR NEGATIVE NEGATIVE Final    Comment:        The GeneXpert MRSA Assay (FDA approved for NASAL specimens only), is one component of a comprehensive MRSA colonization surveillance program. It is not intended to diagnose MRSA infection nor to guide or monitor treatment for MRSA infections.      Scheduled Meds: . apixaban  2.5 mg Oral BID  . feeding supplement (PRO-STAT SUGAR FREE 64)  30 mL Oral q1800  . memantine  10 mg Oral BID  . metoprolol tartrate  25 mg Oral TID  . mirtazapine  15 mg Oral QHS  . senna  1 tablet Oral Daily   Continuous Infusions: . sodium chloride 60 mL/hr at 12/03/16 1723  . cefTRIAXone (ROCEPHIN)  IV       LOS: 2 days   Cherene Altes, MD [facility] Office  [phone] Pager - Text Page per Amion as per below:  On-Call/Text Page:      [email or web address]      password [email or web address]  If 7PM-7AM, please contact night-coverage [email or web address] Password [email or web address] 12/04/2016, 2:07 PM

## 2016-12-04 NOTE — Progress Notes (Signed)
Attempted to call grandson to inform of his grandmothers transfer. Left him a message to inform him of the transfer and the number of the new unit she has gone too. Jearld Hemp RN, BSN, MSN, RN3. 12/04/2016 @ 18.45

## 2016-12-04 NOTE — Evaluation (Addendum)
Physical Therapy Evaluation Patient Details Name: Cindy Trevino MRN: [id number] DOB: March 24, 1925 Today's Date: 12/04/2016   History of Present Illness  Pt is a 83 y/o female who is a nursing home resident at baseline. She presents s/p episode of unresponsiveness and hypoxia. Pt admitted with sepsis. PMH significant for breast CA, dementia, SVT, PAF.   Clinical Impression  Pt admitted with above diagnosis. Pt currently with functional limitations due to the deficits listed below (see PT Problem List). At the time of PT eval, pt required total assist for general bed mobility. Followed commands ~25% of the time - initially pt smiling, shook therapist's hand, and said she was doing "fine, thank you". As pt fatigued, stopped responding verbally, however appeared alert. Pt asleep by end of session. O2 remained in high 90's throughout session. Focus of session was mobility assessment and positioning in bed. Pt will benefit from skilled PT to increase their independence and safety with mobility to allow discharge to the venue listed below.       Follow Up Recommendations SNF;Supervision/Assistance - 24 hour    Equipment Recommendations  None recommended by PT    Recommendations for Other Services       Precautions / Restrictions Precautions Precautions: Fall Restrictions Weight Bearing Restrictions: No      Mobility  Bed Mobility Overal bed mobility: Needs Assistance Bed Mobility: Rolling Rolling: Total assist         General bed mobility comments: Hand-over-hand assist to reach RUE to railing for support. Pt was able to roll to the L side with total assist and use of bed pad. Pillow positioned from L to R side for pressure relief. Rolls placed under ankles to float heels to reduce risk of pressure injury.   Transfers                 General transfer comment: Unable at this time.   Ambulation/Gait             General Gait Details: Unable at this time.   Stairs             Wheelchair Mobility    Modified Rankin (Stroke Patients Only)       Balance                                             Pertinent Vitals/Pain Pain Assessment: Faces Faces Pain Scale: No hurt    Home Living Family/patient expects to be discharged to:: Skilled nursing facility                      Prior Function Level of Independence: Needs assistance         Comments: Per chart review and discussion with RN, appears that pt is normally alert, and able to hold a simple conversation. Mobility is likely with heavy assist and/or lift.      Hand Dominance   Dominant Hand: Right    Extremity/Trunk Assessment   Upper Extremity Assessment Upper Extremity Assessment: Generalized weakness (Noted full shoulder AROM bilaterally; weak grip strength)    Lower Extremity Assessment Lower Extremity Assessment: Generalized weakness;LLE deficits/detail LLE Deficits / Details: Noted response to therapist's touch only on L foot. When asked if pt could feel therapist touching legs, she shook her head "no".     Cervical / Trunk Assessment Cervical / Trunk Assessment:  (Unable  to be assessed)  Communication      Cognition Arousal/Alertness: Lethargic Behavior During Therapy: Flat affect Overall Cognitive Status: History of cognitive impairments - at baseline                                        General Comments      Exercises     Assessment/Plan    PT Assessment Patient needs continued PT services  PT Problem List Decreased strength;Decreased range of motion;Decreased activity tolerance;Decreased balance;Decreased mobility;Decreased cognition;Decreased knowledge of use of DME;Decreased safety awareness;Decreased knowledge of precautions       PT Treatment Interventions DME instruction;Gait training;Stair training;Therapeutic activities;Functional mobility training;Therapeutic exercise;Neuromuscular  re-education;Patient/family education    PT Goals (Current goals can be found in the Care Plan section)  Acute Rehab PT Goals PT Goal Formulation: Patient unable to participate in goal setting Time For Goal Achievement: 12/18/16 Potential to Achieve Goals: Fair    Frequency Min 2X/week   Barriers to discharge        Co-evaluation               AM-PAC PT "6 Clicks" Daily Activity  Outcome Measure Difficulty turning over in bed (including adjusting bedclothes, sheets and blankets)?: Total Difficulty moving from lying on back to sitting on the side of the bed? : Total Difficulty sitting down on and standing up from a chair with arms (e.g., wheelchair, bedside commode, etc,.)?: Total Help needed moving to and from a bed to chair (including a wheelchair)?: Total Help needed walking in hospital room?: Total Help needed climbing 3-5 steps with a railing? : Total 6 Click Score: 6    End of Session Equipment Utilized During Treatment: Oxygen Activity Tolerance: Patient limited by lethargy Patient left: in bed;with bed alarm set;with call bell/phone within reach Nurse Communication: Mobility status PT Visit Diagnosis: Muscle weakness (generalized) (M62.81)    Time: 1430-1446 PT Time Calculation (min) (ACUTE ONLY): 16 min   Charges:   PT Evaluation $PT Eval Moderate Complexity: 1 Procedure     PT G Codes:        Rolinda Roan, PT, DPT Acute Rehabilitation Services Pager: [phone]   Thelma Comp 12/04/2016, 3:04 PM

## 2016-12-04 NOTE — Progress Notes (Signed)
Having runs of SVT, patient non symptomatic, does not feel it. BP stable. NP made aware.

## 2016-12-04 NOTE — Progress Notes (Signed)
Grandson & POA, Darell Bentley visited patient and helped this RN take the crushed medicines in chocolate pudding and provided a list for the patient's meals tomorrow, 12/05/16.

## 2016-12-05 DIAGNOSIS — F320 Major depressive disorder, single episode, mild: Secondary | ICD-10-CM

## 2016-12-05 DIAGNOSIS — A414 Sepsis due to anaerobes: Secondary | ICD-10-CM

## 2016-12-05 DIAGNOSIS — N10 Acute tubulo-interstitial nephritis: Secondary | ICD-10-CM

## 2016-12-05 DIAGNOSIS — F0390 Unspecified dementia without behavioral disturbance: Secondary | ICD-10-CM

## 2016-12-05 DIAGNOSIS — I10 Essential (primary) hypertension: Secondary | ICD-10-CM

## 2016-12-05 DIAGNOSIS — I482 Chronic atrial fibrillation: Secondary | ICD-10-CM

## 2016-12-05 DIAGNOSIS — G894 Chronic pain syndrome: Secondary | ICD-10-CM

## 2016-12-05 DIAGNOSIS — I9589 Other hypotension: Secondary | ICD-10-CM

## 2016-12-05 NOTE — Progress Notes (Signed)
This RN called Nutrition Services and ordered patient's meals for today per Grandson's list which includes chocolate pudding.  Patient is asleep at this time.

## 2016-12-05 NOTE — Progress Notes (Signed)
OT Cancellation Note and Discharge  Patient Details Name: Cindy Trevino MRN: [id number] DOB: 05-17-1925   Cancelled Treatment:    Reason Eval/Treat Not Completed: Other (comment) Pt is Medicare and pt from SNF with current D/C plan back to SNF. No apparent immediate acute care OT needs, therefore will defer OT to SNF. If OT eval is needed please call Acute Rehab Dept. at [phone] or text page OT at [phone].    Almon Register [phone] 12/05/2016, 7:06 AM

## 2016-12-05 NOTE — Progress Notes (Signed)
PROGRESS NOTE    Cindy Trevino  ZOX:[id number] DOB: 12-12-1924 DOA: 12/02/2016 PCP: Hennie Duos, MD   Brief Narrative:  83 y.o.femalewith history of COPD, PAF, Advanced Dementia, breast cancer, hyperlipidemia, hypertension, and SVT   Who presented from [facility] after running fevers throughout the day and developing AMS.  In the ED she was obtunded, hypoxic in the 80s, and hypotensive w/ SBP in the 80s as well.   Subjective: 6/8 A/O 1, (does not know where, why, when). Pleasantly confused. Cooperative.   Assessment & Plan:   Active Problems:   Essential hypertension   Vascular dementia with behavior disturbance   Paroxysmal atrial fibrillation (HCC)   Hyperlipidemia   Depression   Sepsis, unspecified organism ([place])   AKI (acute kidney injury) ([place])   Chronic diastolic CHF (congestive heart failure) (HCC)   Protein calorie malnutrition (HCC)   Encephalopathy   Hypotension   Sepsis due to Klebsiella pneumoniae Pyelonephritis -Complete 5 day course antibiotics , currently on Rocephin   Chronic diastolic congestive heart failure -LV EF of 50-55% w/  grade 1 diastolic dysfunction  -no evidence of volume overload at this time -Strict I&O -Daily weight  A-Fib chronic -rate controlled  - continue Eliquis  Hypotension/essential Hypertension  -Hypotension Resolved -Metoprolol 12.5 mg TID  Acute kidney injury (baseline 1.2 ) -Cr 2.87 at presentation  - likely simple prerenal azotemia but can't yet rule out an element of ATN  -Improved but not at baseline.  Last Labs    Recent Labs Lab 12/02/16 0556 12/03/16 0103 12/04/16 0314  CREATININE 2.87* 2.28* 1.93*     Advanced Dementia/ Acute encephalopathy -Most likely at baseline will DC in a.m. after completion of antibiotics  Physical deconditioning/protein calorie malnutrition   Depression -continue Remeron  Chronic pain syndrome -continue tramadol     DVT prophylaxis:  Eliquis Code Status: NO CODE - DNR Family Communication: no family present at time of exam  Disposition Plan: stable for transfer to medical bed. - PT/OT evals - eventual [facility] to [facility]      Consultants:  None  Procedures/Significant Events:  None   VENTILATOR SETTINGS: None   Cultures 6/5 urine positive KLEBSIELLA PNEUMONIAE   6/5 blood NGTD 6/5 MRSA by PCR negative    Antimicrobials: Anti-infectives    Start     Stop   12/04/16 1500  cefTRIAXone (ROCEPHIN) 1 g in dextrose 5 % 50 mL IVPB         12/04/16 0600  vancomycin (VANCOCIN) IVPB 1000 mg/200 mL premix  Status:  Discontinued     12/02/16 0823   12/02/16 1400  piperacillin-tazobactam (ZOSYN) IVPB 2.25 g  Status:  Discontinued     12/04/16 1405   12/02/16 0600  piperacillin-tazobactam (ZOSYN) IVPB 3.375 g     12/02/16 0722   12/02/16 0600  vancomycin (VANCOCIN) IVPB 1000 mg/200 mL premix     12/02/16 0722       Devices NA   LINES / TUBES:  NA    Continuous Infusions: . sodium chloride 50 mL/hr at 12/05/16 0007  . cefTRIAXone (ROCEPHIN)  IV Stopped (12/04/16 1521)     Objective: Vitals:   12/04/16 1646 12/04/16 1825 12/04/16 2226 12/05/16 0527  BP: 108/71 (!) 96/56 134/74 110/74  Pulse: 89 75 85 75  Resp:  19 17 16   Temp:  98 F (36.7 C) 98.9 F (37.2 C) 98.4 F (36.9 C)  TempSrc:  Oral Axillary Axillary  SpO2:  94% 98% 97%  Weight:      Height:        Intake/Output Summary (Last 24 hours) at 12/05/16 1026 Last data filed at 12/05/16 0617  Gross per 24 hour  Intake           1292.5 ml  Output              240 ml  Net           1052.5 ml   Filed Weights   12/03/16 0500 12/04/16 0300 12/04/16 0600  Weight: 159 lb 2.8 oz (72.2 kg) 165 lb 2 oz (74.9 kg) 165 lb 2 oz (74.9 kg)    Examination:  General:A/O 1, (does not know where, why, when). Pleasantly confused. Cooperative. No acute respiratory distress Eyes: negative scleral hemorrhage, negative anisocoria, negative  icterus ENT: Negative Runny nose, negative gingival bleeding, Neck:  Negative scars, masses, torticollis, lymphadenopathy, JVD Lungs: Clear to auscultation bilaterally without wheezes or crackles Cardiovascular: Regular rate and rhythm without murmur gallop or rub normal S1 and S2 Abdomen: negative abdominal pain, nondistended, positive soft, bowel sounds, no rebound, no ascites, no appreciable mass Extremities: No significant cyanosis, clubbing, or edema bilateral lower extremities Skin: Negative rashes, lesions, ulcers Psychiatric:  Unable to fully assess secondary to dementia Central nervous system:  Cranial nerves II through XII intact, tongue/uvula midline, all extremities muscle strength 5/5, sensation intact throughout, negative dysarthria, negative expressive aphasia, negative receptive aphasia.  .     Data Reviewed: Care during the described time interval was provided by me .  I have reviewed this patient's available data, including medical history, events of note, physical examination, and all test results as part of my evaluation. I have personally reviewed and interpreted all radiology studies.  CBC:  Recent Labs Lab 12/02/16 0556 12/03/16 0103 12/04/16 0314  WBC 21.5* 13.1* 10.8*  NEUTROABS 17.8*  --   --   HGB 10.5* 9.7* 10.5*  HCT 34.1* 32.3* 34.3*  MCV 89.5 90.0 88.6  PLT 341 258 161   Basic Metabolic Panel:  Recent Labs Lab 12/02/16 0556 12/03/16 0103 12/04/16 0314  NA 137 136 135  K 4.2 3.9 4.1  CL 100* 107 106  CO2 23 18* 19*  GLUCOSE 137* 131* 124*  BUN 43* 33* 27*  CREATININE 2.87* 2.28* 1.93*  CALCIUM 8.2* 7.2* 7.6*   GFR: Estimated Creatinine Clearance: 18.8 mL/min (A) (by C-G formula based on SCr of 1.93 mg/dL (H)). Liver Function Tests:  Recent Labs Lab 12/02/16 0556  AST 16  ALT 13*  ALKPHOS 74  BILITOT 0.9  PROT 6.3*  ALBUMIN 2.3*   No results for input(s): LIPASE, AMYLASE in the last 168 hours. No results for input(s): AMMONIA  in the last 168 hours. Coagulation Profile:  Recent Labs Lab 12/02/16 0556  INR 1.82   Cardiac Enzymes: No results for input(s): CKTOTAL, CKMB, CKMBINDEX, TROPONINI in the last 168 hours. BNP (last 3 results) No results for input(s): PROBNP in the last 8760 hours. HbA1C: No results for input(s): HGBA1C in the last 72 hours. CBG: No results for input(s): GLUCAP in the last 168 hours. Lipid Profile: No results for input(s): CHOL, HDL, LDLCALC, TRIG, CHOLHDL, LDLDIRECT in the last 72 hours. Thyroid Function Tests: No results for input(s): TSH, T4TOTAL, FREET4, T3FREE, THYROIDAB in the last 72 hours. Anemia Panel: No results for input(s): VITAMINB12, FOLATE, FERRITIN, TIBC, IRON, RETICCTPCT in the last 72 hours. Urine analysis:    Component Value Date/Time   COLORURINE YELLOW 12/02/2016 0548  APPEARANCEUR TURBID (A) 12/02/2016 0548   LABSPEC 1.012 12/02/2016 0548   PHURINE 6.0 12/02/2016 0548   GLUCOSEU NEGATIVE 12/02/2016 0548   HGBUR SMALL (A) 12/02/2016 0548   BILIRUBINUR NEGATIVE 12/02/2016 0548   KETONESUR NEGATIVE 12/02/2016 0548   PROTEINUR 100 (A) 12/02/2016 0548   UROBILINOGEN 0.2 04/17/2014 1340   NITRITE NEGATIVE 12/02/2016 0548   LEUKOCYTESUR SMALL (A) 12/02/2016 0548   Sepsis Labs: @LABRCNTIP (procalcitonin:4,lacticidven:4)  ) Recent Results (from the past 240 hour(s))  Urine culture     Status: Abnormal   Collection Time: 12/02/16  5:48 AM  Result Value Ref Range Status   Specimen Description URINE, CATHETERIZED  Final   Special Requests NONE  Final   Culture >=100,000 COLONIES/mL KLEBSIELLA PNEUMONIAE (A)  Final   Report Status 12/04/2016 FINAL  Final   Organism ID, Bacteria KLEBSIELLA PNEUMONIAE (A)  Final      Susceptibility   Klebsiella pneumoniae - MIC*    AMPICILLIN >=32 RESISTANT Resistant     CEFAZOLIN <=4 SENSITIVE Sensitive     CEFTRIAXONE <=1 SENSITIVE Sensitive     CIPROFLOXACIN <=0.25 SENSITIVE Sensitive     GENTAMICIN <=1 SENSITIVE  Sensitive     IMIPENEM <=0.25 SENSITIVE Sensitive     NITROFURANTOIN 64 INTERMEDIATE Intermediate     TRIMETH/SULFA <=20 SENSITIVE Sensitive     AMPICILLIN/SULBACTAM 4 SENSITIVE Sensitive     PIP/TAZO <=4 SENSITIVE Sensitive     Extended ESBL NEGATIVE Sensitive     * >=100,000 COLONIES/mL KLEBSIELLA PNEUMONIAE  Blood Culture (routine x 2)     Status: None (Preliminary result)   Collection Time: 12/02/16  5:50 AM  Result Value Ref Range Status   Specimen Description BLOOD RIGHT ARM  Final   Special Requests   Final    BOTTLES DRAWN AEROBIC AND ANAEROBIC Blood Culture adequate volume   Culture NO GROWTH 3 DAYS  Final   Report Status PENDING  Incomplete  Blood Culture (routine x 2)     Status: None (Preliminary result)   Collection Time: 12/02/16  6:00 AM  Result Value Ref Range Status   Specimen Description BLOOD LEFT ARM  Final   Special Requests IN PEDIATRIC BOTTLE Blood Culture adequate volume  Final   Culture NO GROWTH 3 DAYS  Final   Report Status PENDING  Incomplete  MRSA PCR Screening     Status: None   Collection Time: 12/02/16  8:28 AM  Result Value Ref Range Status   MRSA by PCR NEGATIVE NEGATIVE Final    Comment:        The GeneXpert MRSA Assay (FDA approved for NASAL specimens only), is one component of a comprehensive MRSA colonization surveillance program. It is not intended to diagnose MRSA infection nor to guide or monitor treatment for MRSA infections.          Radiology Studies: No results found.      Scheduled Meds: . apixaban  2.5 mg Oral BID  . feeding supplement (PRO-STAT SUGAR FREE 64)  30 mL Oral q1800  . memantine  10 mg Oral BID  . metoprolol tartrate  12.5 mg Oral TID  . mirtazapine  15 mg Oral QHS  . senna  1 tablet Oral Daily   Continuous Infusions: . sodium chloride 50 mL/hr at 12/05/16 0007  . cefTRIAXone (ROCEPHIN)  IV Stopped (12/04/16 1521)     LOS: 3 days    Time spent: 40 minutes    Keeyon Privitera, Geraldo Docker, MD [facility] Pager [phone]   If 7PM-7AM,  please contact night-coverage [email or web address] Password [email or web address] 12/05/2016, 10:26 AM

## 2016-12-05 NOTE — Progress Notes (Signed)
Patient refused blood draw at this time.  Will try later when grandson  Or family member is at bedside.

## 2016-12-06 LAB — BASIC METABOLIC PANEL
Anion gap: 9 (ref 5–15)
BUN: 23 mg/dL — ABNORMAL HIGH (ref 6–20)
BUN: 23 — AB (ref 4–21)
CALCIUM: 7.6 mg/dL — AB (ref 8.9–10.3)
CO2: 20 mmol/L — AB (ref 22–32)
CREATININE: 1.43 mg/dL — AB (ref 0.44–1.00)
Chloride: 105 mmol/L (ref 101–111)
Creatinine: 1.4 — AB (ref 0.5–1.1)
GFR, EST AFRICAN AMERICAN: 36 mL/min — AB (ref >60)
GFR, EST NON AFRICAN AMERICAN: 31 mL/min — AB (ref >60)
GLUCOSE: 85
Glucose, Bld: 85 mg/dL (ref 65–99)
Potassium: 3.7 mmol/L (ref 3.5–5.1)
SODIUM: 134 mmol/L — AB (ref 135–145)
Sodium: 134 — AB (ref 137–147)

## 2016-12-06 LAB — CBC
HCT: 32.7 % — ABNORMAL LOW (ref 36.0–46.0)
Hemoglobin: 10.1 g/dL — ABNORMAL LOW (ref 12.0–15.0)
MCH: 27.4 pg (ref 26.0–34.0)
MCHC: 30.9 g/dL (ref 30.0–36.0)
MCV: 88.9 fL (ref 78.0–100.0)
Platelets: 192 10*3/uL (ref 150–400)
RBC: 3.68 MIL/uL — AB (ref 3.87–5.11)
RDW: 17 % — ABNORMAL HIGH (ref 11.5–15.5)
WBC: 7.2 10*3/uL (ref 4.0–10.5)

## 2016-12-06 LAB — CBC AND DIFFERENTIAL: WBC: 7.2

## 2016-12-06 MED ORDER — CEFUROXIME AXETIL 500 MG PO TABS
250.0000 mg | ORAL_TABLET | Freq: Two times a day (BID) | ORAL | Status: DC
  Administered 2016-12-07: 250 mg via ORAL
  Filled 2016-12-06: qty 1

## 2016-12-06 NOTE — Progress Notes (Signed)
[place] TEAM 1 - Landess W Newsom  PJA:[id number] DOB: January 13, 1925 DOA: 12/02/2016 PCP: Hennie Duos, MD    Brief Narrative:  83 y.o. female with history of COPD, PAF, advanced dementia, breast cancer, hyperlipidemia, hypertension, and SVT who presented from [facility] after running fevers throughout the day and developing AMS.  In the ED she was obtunded, hypoxic in the 80s, and hypotensive w/ SBP in the 80s as well.  Subjective: Resting comfortably in bed.  Is conversant and pleasant.  Tells me she feels much better.  Denies cp, sob, n/v, or abdom pain.  Tells me she is having regular bowel movements.    Assessment & Plan:  Sepsis due to Klebsiella pneumoniae Pyelonephritis Continues to improve - given serious nature of her infection, I will plan to complete 7 full days of abx tx - will transition to oral abx today   Hypotension  Due to above - resolved w/ volume resuscitation and tx of infection   Acute kidney injury  Cr 2.87 at presentation - baseline 1.2 - likely simple prerenal azotemia - rapidly approaching her baseline - stop IVF today and recheck in AM  Recent Labs Lab 12/02/16 0556 12/03/16 0103 12/04/16 0314 12/06/16 0346  CREATININE 2.87* 2.28* 1.93* 1.43*   Acute encephalopathy Due to advanced dementia and sepsis - appears to be at her baseline mental status presently   Physical deconditioning/protein calorie malnutrition To return to SNF when bed arranged  Chronic diastolic congestive heart failure EF of 50-55% w/  grade 1 diastolic dysfunction - no evidence of volume overload on exam - avoid diuretic given advanced age, dementia, and probable inconsistent intake   Depression continue Remeron  Chronic pain continue tramadol - no evidence of uncontrolled pain at this time   Afib rate controlled - continue Eliquis but risk of bleeding/fall may soon favor stopping anticoagulation   DVT prophylaxis: eliquis Code Status: NO  CODE - DNR Family Communication: no family present at time of exam  Disposition Plan: stable for transfer to medical bed - PT/OT evals - eventual return to SNF   Consultants:  none  Procedures: none  Antimicrobials:  Zosyn 6/4 > 6/7 Rocephin 6/7 > 6/9 Vancomycin 6/4 Ceftin 6/10 >  Objective: Blood pressure 122/70, pulse 76, temperature 98 F (36.7 C), temperature source Oral, resp. rate 17, height 5\' 3"  (1.6 m), weight 73.6 kg (162 lb 4.1 oz), SpO2 93 %.  Intake/Output Summary (Last 24 hours) at 12/06/16 1123 Last data filed at 12/06/16 0400  Gross per 24 hour  Intake              970 ml  Output              600 ml  Net              370 ml   Filed Weights   12/04/16 0600 12/05/16 2000 12/06/16 0457  Weight: 74.9 kg (165 lb 2 oz) 74.9 kg (165 lb 2 oz) 73.6 kg (162 lb 4.1 oz)    Examination: General: No acute respiratory distress at rest in bed  Lungs: Clear to auscultation B w/o wheeze  Cardiovascular: Regular rate - no gallup rub or M  Abdomen: Nontender, nondistended, soft, bowel sounds positive, no rebound, overweight  Extremities: No significant edema B LE   CBC:  Recent Labs Lab 12/02/16 0556 12/03/16 0103 12/04/16 0314 12/06/16 0346  WBC 21.5* 13.1* 10.8* 7.2  NEUTROABS 17.8*  --   --   --  HGB 10.5* 9.7* 10.5* 10.1*  HCT 34.1* 32.3* 34.3* 32.7*  MCV 89.5 90.0 88.6 88.9  PLT 341 258 230 440   Basic Metabolic Panel:  Recent Labs Lab 12/02/16 0556 12/03/16 0103 12/04/16 0314 12/06/16 0346  NA 137 136 135 134*  K 4.2 3.9 4.1 3.7  CL 100* 107 106 105  CO2 23 18* 19* 20*  GLUCOSE 137* 131* 124* 85  BUN 43* 33* 27* 23*  CREATININE 2.87* 2.28* 1.93* 1.43*  CALCIUM 8.2* 7.2* 7.6* 7.6*   GFR: Estimated Creatinine Clearance: 25.1 mL/min (A) (by C-G formula based on SCr of 1.43 mg/dL (H)).  Liver Function Tests:  Recent Labs Lab 12/02/16 0556  AST 16  ALT 13*  ALKPHOS 74  BILITOT 0.9  PROT 6.3*  ALBUMIN 2.3*    Coagulation  Profile:  Recent Labs Lab 12/02/16 0556  INR 1.82    HbA1C: Hemoglobin A1C  Date/Time Value Ref Range Status  08/12/2016 6.1  Final   Hgb A1c MFr Bld  Date/Time Value Ref Range Status  04/18/2014 07:10 AM 5.7 (H) <5.7 % Final    Comment:    (NOTE)                                                                       According to the ADA Clinical Practice Recommendations for 2011, when HbA1c is used as a screening test:  >=6.5%   Diagnostic of Diabetes Mellitus           (if abnormal result is confirmed) 5.7-6.4%   Increased risk of developing Diabetes Mellitus References:Diagnosis and Classification of Diabetes Mellitus,Diabetes HKVQ,2595,63(OVFIE 1):S62-S69 and Standards of Medical Care in         Diabetes - 2011,Diabetes PPIR,5188,41 (Suppl 1):S11-S61.     Recent Results (from the past 240 hour(s))  Urine culture     Status: Abnormal   Collection Time: 12/02/16  5:48 AM  Result Value Ref Range Status   Specimen Description URINE, CATHETERIZED  Final   Special Requests NONE  Final   Culture >=100,000 COLONIES/mL KLEBSIELLA PNEUMONIAE (A)  Final   Report Status 12/04/2016 FINAL  Final   Organism ID, Bacteria KLEBSIELLA PNEUMONIAE (A)  Final      Susceptibility   Klebsiella pneumoniae - MIC*    AMPICILLIN >=32 RESISTANT Resistant     CEFAZOLIN <=4 SENSITIVE Sensitive     CEFTRIAXONE <=1 SENSITIVE Sensitive     CIPROFLOXACIN <=0.25 SENSITIVE Sensitive     GENTAMICIN <=1 SENSITIVE Sensitive     IMIPENEM <=0.25 SENSITIVE Sensitive     NITROFURANTOIN 64 INTERMEDIATE Intermediate     TRIMETH/SULFA <=20 SENSITIVE Sensitive     AMPICILLIN/SULBACTAM 4 SENSITIVE Sensitive     PIP/TAZO <=4 SENSITIVE Sensitive     Extended ESBL NEGATIVE Sensitive     * >=100,000 COLONIES/mL KLEBSIELLA PNEUMONIAE  Blood Culture (routine x 2)     Status: None (Preliminary result)   Collection Time: 12/02/16  5:50 AM  Result Value Ref Range Status   Specimen Description BLOOD RIGHT ARM  Final    Special Requests   Final    BOTTLES DRAWN AEROBIC AND ANAEROBIC Blood Culture adequate volume   Culture NO GROWTH 3 DAYS  Final   Report Status PENDING  Incomplete  Blood Culture (routine x 2)     Status: None (Preliminary result)   Collection Time: 12/02/16  6:00 AM  Result Value Ref Range Status   Specimen Description BLOOD LEFT ARM  Final   Special Requests IN PEDIATRIC BOTTLE Blood Culture adequate volume  Final   Culture NO GROWTH 3 DAYS  Final   Report Status PENDING  Incomplete  MRSA PCR Screening     Status: None   Collection Time: 12/02/16  8:28 AM  Result Value Ref Range Status   MRSA by PCR NEGATIVE NEGATIVE Final    Comment:        The GeneXpert MRSA Assay (FDA approved for NASAL specimens only), is one component of a comprehensive MRSA colonization surveillance program. It is not intended to diagnose MRSA infection nor to guide or monitor treatment for MRSA infections.      Scheduled Meds: . apixaban  2.5 mg Oral BID  . feeding supplement (PRO-STAT SUGAR FREE 64)  30 mL Oral q1800  . memantine  10 mg Oral BID  . metoprolol tartrate  12.5 mg Oral TID  . mirtazapine  15 mg Oral QHS  . senna  1 tablet Oral Daily     LOS: 4 days   Cherene Altes, MD [facility] Office  [phone] Pager - Text Page per Amion as per below:  On-Call/Text Page:      [email or web address]      password [email or web address]  If 7PM-7AM, please contact night-coverage [email or web address] Password [email or web address] 12/06/2016, 11:23 AM

## 2016-12-07 DIAGNOSIS — A419 Sepsis, unspecified organism: Secondary | ICD-10-CM | POA: Diagnosis not present

## 2016-12-07 DIAGNOSIS — F0151 Vascular dementia with behavioral disturbance: Secondary | ICD-10-CM | POA: Diagnosis not present

## 2016-12-07 DIAGNOSIS — I10 Essential (primary) hypertension: Secondary | ICD-10-CM | POA: Diagnosis not present

## 2016-12-07 DIAGNOSIS — I480 Paroxysmal atrial fibrillation: Secondary | ICD-10-CM | POA: Diagnosis not present

## 2016-12-07 DIAGNOSIS — I951 Orthostatic hypotension: Secondary | ICD-10-CM | POA: Diagnosis not present

## 2016-12-07 DIAGNOSIS — B961 Klebsiella pneumoniae [K. pneumoniae] as the cause of diseases classified elsewhere: Secondary | ICD-10-CM | POA: Diagnosis not present

## 2016-12-07 DIAGNOSIS — F0390 Unspecified dementia without behavioral disturbance: Secondary | ICD-10-CM | POA: Diagnosis not present

## 2016-12-07 DIAGNOSIS — I5032 Chronic diastolic (congestive) heart failure: Secondary | ICD-10-CM | POA: Diagnosis not present

## 2016-12-07 DIAGNOSIS — N12 Tubulo-interstitial nephritis, not specified as acute or chronic: Secondary | ICD-10-CM | POA: Diagnosis not present

## 2016-12-07 DIAGNOSIS — E784 Other hyperlipidemia: Secondary | ICD-10-CM | POA: Diagnosis not present

## 2016-12-07 DIAGNOSIS — N179 Acute kidney failure, unspecified: Secondary | ICD-10-CM | POA: Diagnosis not present

## 2016-12-07 DIAGNOSIS — F39 Unspecified mood [affective] disorder: Secondary | ICD-10-CM | POA: Diagnosis not present

## 2016-12-07 DIAGNOSIS — R4182 Altered mental status, unspecified: Secondary | ICD-10-CM | POA: Diagnosis not present

## 2016-12-07 DIAGNOSIS — I69354 Hemiplegia and hemiparesis following cerebral infarction affecting left non-dominant side: Secondary | ICD-10-CM | POA: Diagnosis not present

## 2016-12-07 DIAGNOSIS — G9340 Encephalopathy, unspecified: Secondary | ICD-10-CM | POA: Diagnosis not present

## 2016-12-07 DIAGNOSIS — R1312 Dysphagia, oropharyngeal phase: Secondary | ICD-10-CM | POA: Diagnosis not present

## 2016-12-07 DIAGNOSIS — N390 Urinary tract infection, site not specified: Secondary | ICD-10-CM | POA: Diagnosis not present

## 2016-12-07 DIAGNOSIS — Z0000 Encounter for general adult medical examination without abnormal findings: Secondary | ICD-10-CM | POA: Diagnosis not present

## 2016-12-07 DIAGNOSIS — M6281 Muscle weakness (generalized): Secondary | ICD-10-CM | POA: Diagnosis not present

## 2016-12-07 DIAGNOSIS — J189 Pneumonia, unspecified organism: Secondary | ICD-10-CM | POA: Diagnosis not present

## 2016-12-07 DIAGNOSIS — F419 Anxiety disorder, unspecified: Secondary | ICD-10-CM | POA: Diagnosis not present

## 2016-12-07 DIAGNOSIS — I69993 Ataxia following unspecified cerebrovascular disease: Secondary | ICD-10-CM | POA: Diagnosis not present

## 2016-12-07 DIAGNOSIS — I509 Heart failure, unspecified: Secondary | ICD-10-CM | POA: Diagnosis not present

## 2016-12-07 DIAGNOSIS — R488 Other symbolic dysfunctions: Secondary | ICD-10-CM | POA: Diagnosis not present

## 2016-12-07 LAB — CULTURE, BLOOD (ROUTINE X 2)
Culture: NO GROWTH
Culture: NO GROWTH
Special Requests: ADEQUATE
Special Requests: ADEQUATE

## 2016-12-07 MED ORDER — CEFUROXIME AXETIL 250 MG PO TABS: 250.0000 mg | ORAL_TABLET | Freq: Two times a day (BID) | ORAL | Status: AC

## 2016-12-07 MED ORDER — METOPROLOL TARTRATE 25 MG PO TABS: 12.5000 mg | ORAL_TABLET | Freq: Three times a day (TID) | ORAL | 3 refills | Status: AC

## 2016-12-07 NOTE — Care Management Note (Signed)
Case Management Note  Patient Details  Name: Cindy Trevino MRN: [id number] Date of Birth: May 29, 1925  Subjective/Objective:                 DC order in place to SNF. Spoke w [place] [phone], patient will return to [facility] today.    Action/Plan:  DC to SNF as facilitated by CSW.   Expected Discharge Date:  12/07/16               Expected Discharge Plan:  [place] (From [facility])  In-House Referral:  Clinical Social Work  Discharge planning Services  CM Consult  Post Acute Care Choice:    Choice offered to:     DME Arranged:    DME Agency:     HH Arranged:    [place] Agency:     Status of Service:  Completed, signed off  If discussed at [employer], dates discussed:    Additional Comments:  Carles Collet, RN 12/07/2016, 10:57 AM

## 2016-12-07 NOTE — Progress Notes (Signed)
Patient is set to discharge to [facility] today. Patient & grandson, Darrel, aware. Discharge packet given to RN. [facility] called for transport.    Kingsley Spittle, LCSWA Clinical Social Worker [phone]

## 2016-12-07 NOTE — Progress Notes (Signed)
Patient placed on low bed 

## 2016-12-07 NOTE — Progress Notes (Signed)
Report called to [facility]. 

## 2016-12-07 NOTE — Discharge Summary (Addendum)
Cindy Trevino  MR#: [id number]  DOB:September 08, 1924  Date of Admission: 12/02/2016 Date of Discharge: 12/07/2016  Attending Physician:Janes Colegrove T  Patient's KAJ:[id number], Noah Delaine, MD  Consults:  none  Disposition: D/C to SNF  Follow-up Appts: Follow-up Information    Hennie Duos, MD Follow up.   Specialty:  Internal Medicine Why:  You will be seen at your facility by your Doctor.   Contact information: [place] [id number] [phone]          Discharge Diagnoses: Sepsis due to Klebsiella pneumoniae Pyelonephritis Hypotension  Acute kidney injury  Acute encephalopathy Physical deconditioning/protein calorie malnutrition Chronic diastolic congestive heart failure Depression Chronic pain Afib   Initial presentation: 83 y.o.femalewith history of COPD, PAF, advanced dementia, breast cancer, hyperlipidemia, hypertension, and SVT who presented from [facility] after running fevers throughout the day and developing AMS.  In the ED she was obtunded, hypoxic in the 80s, and hypotensive w/ SBP in the 80s as well.  Hospital Course:  Sepsis due to Klebsiella pneumoniae Pyelonephritis iproved nicely w/ directed abx tx - given serious nature of her infection, plan to complete 7 full days of abx tx, w/ her last dose of ceftin being 6/10 PM   Hypotension  Due to above - resolved w/ volume resuscitation and tx of infection   Acute kidney injury  Cr 2.87 at presentation - baseline 1.2 - likely simple prerenal azotemia - rapidly approaching her baseline - crt improved to 1.43 at time of d/c, and was on a downward deflection   Acute encephalopathy Due to advanced dementia and sepsis - appears to be at her baseline mental status at time of d/c   Physical deconditioning/protein calorie malnutrition To return to SNF  Chronic diastolic congestive heart failure EF of 50-55% w/  grade 1 diastolic dysfunction - no evidence  of volume overload on exam at time of d/c - avoid diuretic given advanced age, dementia, and probable inconsistent intake   Depression continue Remeron  Chronic pain continue tramadol - no evidence of uncontrolled pain at this time   Afib rate controlled - continue Eliquis but risk of bleeding/fall may soon favor stopping anticoagulation    Allergies as of 12/07/2016   No Known Allergies     Medication List    STOP taking these medications   Ferrous Sulfate 220 (44 Fe) MG/5ML Liqd   LORazepam 0.5 MG tablet Commonly known as:  ATIVAN   promethazine 25 MG/ML injection Commonly known as:  PHENERGAN   traMADol 50 MG tablet Commonly known as:  ULTRAM     TAKE these medications   acetaminophen 325 MG tablet Commonly known as:  TYLENOL Take 650 mg by mouth every 6 (six) hours as needed (for pain). Notify MD if pain is not relieved. Do not exceed 3000 mg in 24 hour period   atorvastatin 40 MG tablet Commonly known as:  LIPITOR Take 1 tablet (40 mg total) by mouth daily at 6 PM.   bisacodyl 10 MG suppository Commonly known as:  DULCOLAX Place 10 mg rectally as needed for moderate constipation.   cefUROXime 250 MG tablet Commonly known as:  CEFTIN Take 1 tablet (250 mg total) by mouth 2 (two) times daily with a meal.   ELIQUIS 2.5 MG Tabs tablet Generic drug:  apixaban Take 2.5 mg by mouth 2 (two) times daily.   feeding supplement (ENSURE) Pudg Take 1 Container by mouth 2 (two) times daily between meals.   feeding  supplement (PRO-STAT SUGAR FREE 64) Liqd Take 30 mLs by mouth daily at 6 PM.   memantine 10 MG tablet Commonly known as:  NAMENDA Take 10 mg by mouth 2 (two) times daily.   metoprolol tartrate 25 MG tablet Commonly known as:  LOPRESSOR Take 0.5 tablets (12.5 mg total) by mouth 3 (three) times daily. What changed:  how much to take   mirtazapine 15 MG tablet Commonly known as:  REMERON Take 1/2 tablet ( 7.5 mg ) by mouth at bedtime     PRESERVISION AREDS 2 PO Take 1 tablet by mouth 2 (two) times daily.   Propylene Glycol 0.6 % Soln Place 1 drop into both eyes 2 (two) times daily.   RA SALINE ENEMA 19-7 GM/118ML Enem Place 1 each rectally as needed (for constipation).   senna 8.6 MG Tabs tablet Commonly known as:  SENOKOT Take 1 tablet by mouth daily.       Day of Discharge BP (!) 151/87 (BP Location: Left Arm)   Pulse 79   Temp 98.7 F (37.1 C) (Oral)   Resp 17   Ht 5\' 3"  (1.6 m)   Wt 73.6 kg (162 lb 4.1 oz)   SpO2 95%   BMI 28.74 kg/m   Physical Exam: General: No acute respiratory distress Lungs: Clear to auscultation bilaterally without wheezes or crackles Cardiovascular: Regular rate and rhythm without murmur Abdomen: Nontender, nondistended, soft, bowel sounds positive, no rebound Extremities: No significant edema bilateral lower extremities  Basic Metabolic Panel:  Recent Labs Lab 12/02/16 0556 12/03/16 0103 12/04/16 0314 12/06/16 0346  NA 137 136 135 134*  K 4.2 3.9 4.1 3.7  CL 100* 107 106 105  CO2 23 18* 19* 20*  GLUCOSE 137* 131* 124* 85  BUN 43* 33* 27* 23*  CREATININE 2.87* 2.28* 1.93* 1.43*  CALCIUM 8.2* 7.2* 7.6* 7.6*    Liver Function Tests:  Recent Labs Lab 12/02/16 0556  AST 16  ALT 13*  ALKPHOS 74  BILITOT 0.9  PROT 6.3*  ALBUMIN 2.3*   Coags:  Recent Labs Lab 12/02/16 0556  INR 1.82   CBC:  Recent Labs Lab 12/02/16 0556 12/03/16 0103 12/04/16 0314 12/06/16 0346  WBC 21.5* 13.1* 10.8* 7.2  NEUTROABS 17.8*  --   --   --   HGB 10.5* 9.7* 10.5* 10.1*  HCT 34.1* 32.3* 34.3* 32.7*  MCV 89.5 90.0 88.6 88.9  PLT 341 258 230 192    Recent Results (from the past 240 hour(s))  Urine culture     Status: Abnormal   Collection Time: 12/02/16  5:48 AM  Result Value Ref Range Status   Specimen Description URINE, CATHETERIZED  Final   Special Requests NONE  Final   Culture >=100,000 COLONIES/mL KLEBSIELLA PNEUMONIAE (A)  Final   Report Status  12/04/2016 FINAL  Final   Organism ID, Bacteria KLEBSIELLA PNEUMONIAE (A)  Final      Susceptibility   Klebsiella pneumoniae - MIC*    AMPICILLIN >=32 RESISTANT Resistant     CEFAZOLIN <=4 SENSITIVE Sensitive     CEFTRIAXONE <=1 SENSITIVE Sensitive     CIPROFLOXACIN <=0.25 SENSITIVE Sensitive     GENTAMICIN <=1 SENSITIVE Sensitive     IMIPENEM <=0.25 SENSITIVE Sensitive     NITROFURANTOIN 64 INTERMEDIATE Intermediate     TRIMETH/SULFA <=20 SENSITIVE Sensitive     AMPICILLIN/SULBACTAM 4 SENSITIVE Sensitive     PIP/TAZO <=4 SENSITIVE Sensitive     Extended ESBL NEGATIVE Sensitive     * >=100,000 COLONIES/mL  KLEBSIELLA PNEUMONIAE  Blood Culture (routine x 2)     Status: None (Preliminary result)   Collection Time: 12/02/16  5:50 AM  Result Value Ref Range Status   Specimen Description BLOOD RIGHT ARM  Final   Special Requests   Final    BOTTLES DRAWN AEROBIC AND ANAEROBIC Blood Culture adequate volume   Culture NO GROWTH 4 DAYS  Final   Report Status PENDING  Incomplete  Blood Culture (routine x 2)     Status: None (Preliminary result)   Collection Time: 12/02/16  6:00 AM  Result Value Ref Range Status   Specimen Description BLOOD LEFT ARM  Final   Special Requests IN PEDIATRIC BOTTLE Blood Culture adequate volume  Final   Culture NO GROWTH 4 DAYS  Final   Report Status PENDING  Incomplete  MRSA PCR Screening     Status: None   Collection Time: 12/02/16  8:28 AM  Result Value Ref Range Status   MRSA by PCR NEGATIVE NEGATIVE Final    Comment:        The GeneXpert MRSA Assay (FDA approved for NASAL specimens only), is one component of a comprehensive MRSA colonization surveillance program. It is not intended to diagnose MRSA infection nor to guide or monitor treatment for MRSA infections.      Time spent in discharge (includes decision making & examination of pt): 35 minutes  12/07/2016, 2:03 PM   Cherene Altes, MD [facility] Office  [phone] Pager  [phone]  On-Call/Text Page:      [email or web address]      password [facility]

## 2016-12-07 NOTE — NC FL2 (Signed)
[place] LEVEL OF CARE SCREENING TOOL     IDENTIFICATION  Patient Name: Cindy Trevino Birthdate: [birth year] Sex: female Admission Date (Current Location): 12/02/2016  [facility] and [place] Number:  Herbalist and Address:  [facility], [place] [address], [place], [place] [address]      Provider Number: [id number]  Attending Physician Name and Address:  Cherene Altes, MD  Relative Name and Phone Number:       Current Level of Care: Hospital Recommended Level of Care: [place] Prior Approval Number:    Date Approved/Denied:   PASRR Number:   [id number]   Discharge Plan: SNF    Current Diagnoses: Patient Active Problem List   Diagnosis Date Noted  . Sepsis, unspecified organism ([place]) 12/02/2016  . AKI (acute kidney injury) ([place]) 12/02/2016  . Chronic diastolic CHF (congestive heart failure) ([place]) 12/02/2016  . Protein calorie malnutrition ([place]) 12/02/2016  . Encephalopathy   . Hypotension   . Macular degeneration 09/06/2016  . Depression 04/23/2016  . Poor appetite 10/11/2015  . Loss of weight 10/11/2015  . Diarrhea 09/10/2015  . Anxiety 07/04/2015  . Mood disorder ([place]) 07/04/2015  . DVT of lower extremity (deep venous thrombosis) ([place]) 01/23/2015  . OA (osteoarthritis) of knee 09/20/2014  . CKD (chronic kidney disease) stage 3, GFR 30-59 ml/min 08/04/2014  . Anemia of chronic disease 08/04/2014  . Hyperlipidemia   . Essential hypertension 04/20/2014  . Vascular dementia with behavior disturbance 04/20/2014  . Paroxysmal atrial fibrillation ([place]) 04/20/2014  . Basal ganglia infarction ([place]) 04/17/2014  . History of breast cancer 08/23/2012    Orientation RESPIRATION BLADDER Height & Weight        Normal Incontinent Weight: 162 lb 4.1 oz (73.6 kg) Height:  5\' 3"  (160 cm)  BEHAVIORAL SYMPTOMS/MOOD NEUROLOGICAL BOWEL NUTRITION STATUS      Incontinent Diet (regular diet )  AMBULATORY STATUS  COMMUNICATION OF NEEDS Skin   Extensive Assist Verbally Normal                       Personal Care Assistance Level of Assistance  Bathing, Feeding, Dressing Bathing Assistance: Maximum assistance Feeding assistance: Limited assistance Dressing Assistance: Maximum assistance     Functional Limitations Info             SPECIAL CARE FACTORS FREQUENCY  PT (By licensed PT), OT (By licensed OT)     PT Frequency: 5 OT Frequency: 5            Contractures      Additional Factors Info  Code Status, Allergies Code Status Info: DNR  Allergies Info: NKA            Current Medications (12/07/2016):  This is the current hospital active medication list Current Facility-Administered Medications  Medication Dose Route Frequency Provider Last Rate Last Dose  . acetaminophen (TYLENOL) tablet 650 mg  650 mg Oral Q6H PRN Waldemar Dickens, MD   650 mg at 12/02/16 2028   Or  . acetaminophen (TYLENOL) suppository 650 mg  650 mg Rectal Q6H PRN Waldemar Dickens, MD      . apixaban Arne [place]) tablet 2.5 mg  2.5 mg Oral BID Waldemar Dickens, MD   2.5 mg at 12/07/16 0920  . bisacodyl (DULCOLAX) suppository 10 mg  10 mg Rectal PRN Joette Catching T, MD      . cefUROXime (CEFTIN) tablet 250 mg  250 mg Oral BID  WC Cherene Altes, MD   250 mg at 12/07/16 0920  . feeding supplement (PRO-STAT SUGAR FREE 64) liquid 30 mL  30 mL Oral q1800 Waldemar Dickens, MD   30 mL at 12/06/16 1626  . LORazepam (ATIVAN) tablet 0.25 mg  0.25 mg Oral Daily PRN Waldemar Dickens, MD   0.25 mg at 12/02/16 1056  . memantine (NAMENDA) tablet 10 mg  10 mg Oral BID Waldemar Dickens, MD   10 mg at 12/07/16 0920  . metoprolol tartrate (LOPRESSOR) tablet 12.5 mg  12.5 mg Oral TID Cherene Altes, MD   12.5 mg at 12/07/16 0920  . mirtazapine (REMERON) tablet 15 mg  15 mg Oral QHS Waldemar Dickens, MD   15 mg at 12/06/16 2125  . ondansetron (ZOFRAN) tablet 4 mg  4 mg Oral Q6H PRN Waldemar Dickens, MD       Or  .  ondansetron [facility]) injection 4 mg  4 mg Intravenous Q6H PRN Waldemar Dickens, MD      . polyvinyl alcohol (LIQUIFILM TEARS) 1.4 % ophthalmic solution 1 drop  1 drop Both Eyes PRN Cherene Altes, MD      . senna (SENOKOT) tablet 8.6 mg  1 tablet Oral Daily Waldemar Dickens, MD   8.6 mg at 12/07/16 0920  . traMADol (ULTRAM) tablet 50 mg  50 mg Oral Q6H PRN Waldemar Dickens, MD   50 mg at 12/02/16 2038     Discharge Medications: Please see discharge summary for a list of discharge medications.  Relevant Imaging Results:  Relevant Lab Results:   Additional Information SS# [id number]  Weston Anna, LCSW

## 2016-12-07 NOTE — Progress Notes (Signed)
CSW has reached out to [place] with [facility] to confirm they are able to take patient back today- CSW waiting for return call.   Kingsley Spittle, LCSWA Clinical Social Worker [phone]

## 2016-12-08 ENCOUNTER — Telehealth: Payer: Self-pay

## 2016-12-08 ENCOUNTER — Non-Acute Institutional Stay (SKILLED_NURSING_FACILITY): Payer: Medicare Other | Admitting: Internal Medicine

## 2016-12-08 ENCOUNTER — Encounter: Payer: Self-pay | Admitting: Internal Medicine

## 2016-12-08 DIAGNOSIS — A419 Sepsis, unspecified organism: Secondary | ICD-10-CM

## 2016-12-08 DIAGNOSIS — I951 Orthostatic hypotension: Secondary | ICD-10-CM | POA: Diagnosis not present

## 2016-12-08 DIAGNOSIS — F0151 Vascular dementia with behavioral disturbance: Secondary | ICD-10-CM

## 2016-12-08 DIAGNOSIS — I480 Paroxysmal atrial fibrillation: Secondary | ICD-10-CM

## 2016-12-08 DIAGNOSIS — N179 Acute kidney failure, unspecified: Secondary | ICD-10-CM

## 2016-12-08 DIAGNOSIS — E7849 Other hyperlipidemia: Secondary | ICD-10-CM

## 2016-12-08 DIAGNOSIS — G9340 Encephalopathy, unspecified: Secondary | ICD-10-CM

## 2016-12-08 DIAGNOSIS — N12 Tubulo-interstitial nephritis, not specified as acute or chronic: Secondary | ICD-10-CM

## 2016-12-08 DIAGNOSIS — I5032 Chronic diastolic (congestive) heart failure: Secondary | ICD-10-CM

## 2016-12-08 DIAGNOSIS — B961 Klebsiella pneumoniae [K. pneumoniae] as the cause of diseases classified elsewhere: Secondary | ICD-10-CM

## 2016-12-08 DIAGNOSIS — F01518 Vascular dementia, unspecified severity, with other behavioral disturbance: Secondary | ICD-10-CM

## 2016-12-08 DIAGNOSIS — E784 Other hyperlipidemia: Secondary | ICD-10-CM | POA: Diagnosis not present

## 2016-12-08 NOTE — Telephone Encounter (Signed)
This is a patient of [place], who was admitted to [facility]  after hospitalization. [facility] F/U is needed. Hospital discharge from [facility]  on 12/07/2016.

## 2016-12-08 NOTE — Progress Notes (Signed)
: Provider:  Noah Delaine. Sheppard Coil, MD Location:  [place] Room Number: 203P Place of Service:  SNF ([phone])  PCP: Hennie Duos, MD Patient Care Team: Hennie Duos, MD as PCP - General (Internal Medicine)  No emergency contact information on file.     Allergies: Patient has no known allergies.  Chief Complaint  Patient presents with  . Readmit To SNF    Admit to Facility    HPI: Patient is 83 y.o. female with COPD, PAF, advanced dementia, breast cancer, hyperlipidemia, hypertension, and SVT who presented from [facility] after running fevers throughout the day and developing AMS. In the ED she was obtunded, hypoxic in the 80s, and hypotensive w/ SBP in the 80s as well. Pt was admitted to [facility] from 6/5[facility]10 where she was treated for sepsis 2/2 Klebsiella pneumniae pyelonephritis fully treated with ceftin. Hospital course was was complicated by hypotension, AKI, and acute encephalopathy. Pt is admitted back to SNF for residential care. While at SNF pt will be followed for HLD, with lipitor, dementia, tx with namenda and PAF, tx with eliquis and metoprolol.  Past Medical History:  Diagnosis Date  . Arthritis   . COPD (chronic obstructive pulmonary disease) ([place])   . Dementia 02/02/2012  . History of breast cancer 2009   Left breast  s/p lumpectomy and rads. declined hormonal tx. last mammogram 2011  . Hyperlipidemia   . Hypertension   . PAF (paroxysmal atrial fibrillation) ([place])   . SVT (supraventricular tachycardia) ([facility])     Past Surgical History:  Procedure Laterality Date  . BREAST LUMPECTOMY Left 2009   left breast  . CESAREAN SECTION    . EYE SURGERY     cataracts x3    Allergies as of 12/08/2016   No Known Allergies     Medication List       Accurate as of 12/08/16 10:19 AM. Always use your most recent med list.          acetaminophen 325 MG tablet Commonly known as:  TYLENOL Take 650 mg by mouth every 6 (six) hours as  needed (for pain). Notify MD if pain is not relieved. Do not exceed 3000 mg in 24 hour period   atorvastatin 40 MG tablet Commonly known as:  LIPITOR Take 1 tablet (40 mg total) by mouth daily at 6 PM.   bisacodyl 10 MG suppository Commonly known as:  DULCOLAX Place 10 mg rectally as needed for moderate constipation.   cefUROXime 250 MG tablet Commonly known as:  CEFTIN Take 1 tablet (250 mg total) by mouth 2 (two) times daily with a meal.   ELIQUIS 2.5 MG Tabs tablet Generic drug:  apixaban Take 2.5 mg by mouth 2 (two) times daily.   feeding supplement (ENSURE) Pudg Take 1 Container by mouth 2 (two) times daily between meals.   feeding supplement (PRO-STAT SUGAR FREE 64) Liqd Take 30 mLs by mouth daily at 6 PM.   memantine 10 MG tablet Commonly known as:  NAMENDA Take 10 mg by mouth 2 (two) times daily.   metoprolol tartrate 25 MG tablet Commonly known as:  LOPRESSOR Take 0.5 tablets (12.5 mg total) by mouth 3 (three) times daily.   mirtazapine 15 MG tablet Commonly known as:  REMERON Take 1/2 tablet ( 7.5 mg ) by mouth at bedtime   PRESERVISION AREDS 2 PO Take 1 tablet by mouth 2 (two) times daily.   Propylene Glycol 0.6 % Soln Place 1  drop into both eyes 2 (two) times daily.   RA SALINE ENEMA 19-7 GM/118ML Enem Place 1 each rectally as needed (for constipation).   senna 8.6 MG Tabs tablet Commonly known as:  SENOKOT Take 1 tablet by mouth daily.       No orders of the defined types were placed in this encounter.   Immunization History  Administered Date(s) Administered  . Influenza Inj Mdck Quad Pf 03/09/2012  . Influenza Split 04/20/2009, 05/12/2010  . Influenza, Seasonal, Injecte, Preservative Fre 03/21/2013  . Influenza-Unspecified 03/22/2015  . PPD Test 05/08/2014  . Pneumococcal Polysaccharide-23 05/12/2010  . Td 01/14/2008  . Zoster 01/14/2008    Social History  Substance Use Topics  . Smoking status: Never Smoker  . Smokeless tobacco:  Never Used  . Alcohol use No    Family history is   Family History  Problem Relation Age of Onset  . Heart disease Mother   . Diabetes Mother   . Heart disease Father   . Diabetes Sister   . Hypertension Sister       Review of Systems  UTO 2/2 dementia;nursing without concerns   Vitals:   12/08/16 1001  BP: (!) 159/83  Pulse: 77  Resp: 18  Temp: 97 F (36.1 C)    SpO2 Readings from Last 1 Encounters:  12/08/16 97%   Body mass index is 28.74 kg/m.     Physical Exam  GENERAL APPEARANCE: Alert, min conversant,  No acute distress.  SKIN: No diaphoresis rash HEAD: Normocephalic, atraumatic  EYES: Conjunctiva/lids clear. Pupils round, reactive. EOMs intact.  EARS: External exam WNL, canals clear. Hearing grossly normal.  NOSE: No deformity or discharge.  MOUTH/THROAT: Lips w/o lesions  RESPIRATORY: Breathing is even, unlabored. Lung sounds are clear   CARDIOVASCULAR: Heart RRR no murmurs, rubs or gallops. No peripheral edema.   GASTROINTESTINAL: Abdomen is soft, non-tender, not distended w/ normal bowel sounds. GENITOURINARY: Bladder non tender, not distended  MUSCULOSKELETAL: No abnormal joints or musculature NEUROLOGIC:  Cranial nerves 2-12 grossly intact. Moves all extremities  PSYCHIATRIC: dementia, no behavioral issues  Patient Active Problem List   Diagnosis Date Noted  . Chronic diastolic CHF (congestive heart failure) ([place]) 12/02/2016  . Protein calorie malnutrition ([place]) 12/02/2016  . Macular degeneration 09/06/2016  . Depression 04/23/2016  . Poor appetite 10/11/2015  . Loss of weight 10/11/2015  . Diarrhea 09/10/2015  . Anxiety 07/04/2015  . Mood disorder ([place]) 07/04/2015  . DVT of lower extremity (deep venous thrombosis) ([place]) 01/23/2015  . OA (osteoarthritis) of knee 09/20/2014  . CKD (chronic kidney disease) stage 3, GFR 30-59 ml/min 08/04/2014  . Anemia of chronic disease 08/04/2014  . Hyperlipidemia   . Essential hypertension 04/20/2014    . Vascular dementia with behavior disturbance 04/20/2014  . Paroxysmal atrial fibrillation ([place]) 04/20/2014  . Basal ganglia infarction ([place]) 04/17/2014  . History of breast cancer 08/23/2012      Labs reviewed: Basic Metabolic Panel:    Component Value Date/Time   NA 134 (L) 12/06/2016 0346   NA 134 (A) 12/06/2016   K 3.7 12/06/2016 0346   CL 105 12/06/2016 0346   CO2 20 (L) 12/06/2016 0346   GLUCOSE 85 12/06/2016 0346   BUN 23 (H) 12/06/2016 0346   BUN 23 (A) 12/06/2016   CREATININE 1.43 (H) 12/06/2016 0346   CALCIUM 7.6 (L) 12/06/2016 0346   PROT 6.3 (L) 12/02/2016 0556   ALBUMIN 2.3 (L) 12/02/2016 0556   AST 16 12/02/2016 0556   ALT 13 (  L) 12/02/2016 0556   ALKPHOS 74 12/02/2016 0556   BILITOT 0.9 12/02/2016 0556   GFRNONAA 31 (L) 12/06/2016 0346   GFRAA 36 (L) 12/06/2016 0346     Recent Labs  12/03/16 0103 12/04/16 12/04/16 0314 12/06/16 12/06/16 0346  NA 136 135* 135 134* 134*  K 3.9 4.1 4.1  --  3.7  CL 107  --  106  --  105  CO2 18*  --  19*  --  20*  GLUCOSE 131*  --  124*  --  85  BUN 33* 27* 27* 23* 23*  CREATININE 2.28* 1.9* 1.93* 1.4* 1.43*  CALCIUM 7.2*  --  7.6*  --  7.6*   Liver Function Tests:  Recent Labs  01/30/16 08/12/16 12/02/16 0556  AST 16 14 16   ALT 8 10 13*  ALKPHOS 58 64 74  BILITOT  --   --  0.9  PROT  --   --  6.3*  ALBUMIN  --   --  2.3*   No results for input(s): LIPASE, AMYLASE in the last 8760 hours. No results for input(s): AMMONIA in the last 8760 hours. CBC:  Recent Labs  12/02/16 0556  12/03/16 0103 12/04/16 12/04/16 0314 12/06/16 12/06/16 0346  WBC 21.5*  < > 13.1* 10.8 10.8* 7.2 7.2  NEUTROABS 17.8*  --   --   --   --   --   --   HGB 10.5*  < > 9.7* 10.5* 10.5*  --  10.1*  HCT 34.1*  < > 32.3* 34* 34.3*  --  32.7*  MCV 89.5  --  90.0  --  88.6  --  88.9  PLT 341  < > 258 230 230  --  192  < > = values in this interval not displayed. Lipid  Recent Labs  01/30/16 08/12/16  CHOL 112 89  HDL 26* 24*   LDLCALC 60 49  TRIG 130 108    Cardiac Enzymes: No results for input(s): CKTOTAL, CKMB, CKMBINDEX, TROPONINI in the last 8760 hours. BNP: No results for input(s): BNP in the last 8760 hours. No results found for: [facility] Lab Results  Component Value Date   HGBA1C 6.1 08/12/2016   Lab Results  Component Value Date   TSH 4.09 08/12/2016   Lab Results  Component Value Date   VITAMINB12 214 07/10/2010   Lab Results  Component Value Date   FOLATE  07/10/2010    6.0 (NOTE)  Reference Ranges        Deficient:       0.4 - 3.3 ng/mL        Indeterminate:   3.4 - 5.4 ng/mL        Normal:              > 5.4 ng/mL   Lab Results  Component Value Date   IRON <10 (L) 07/10/2010   TIBC Not calculated due to Iron <10. 07/10/2010   FERRITIN 361 (H) 07/10/2010    Imaging and Procedures obtained prior to SNF admission: Dg Chest Port 1 View  Result Date: 12/02/2016 CLINICAL DATA:  83 year old female with shortness of breath. Breast cancer post lumpectomy and radiation. Initial encounter. EXAM: PORTABLE CHEST 1 VIEW COMPARISON:  07/13/2010. FINDINGS: Basilar subsegmental atelectasis greater on the left. No pulmonary edema or pneumothorax. Tiny nodular densities left lung apex may be present versus confluence of shadows. Attention to this on follow-up. Mild cardiomegaly. Slightly tortuous aorta. Acromioclavicular joint degenerative changes. IMPRESSION: Basilar subsegmental atelectasis greater  on the left. No pulmonary edema. Tiny nodular densities left lung apex may be present versus confluence of shadows. Attention to this on follow-up. Mild cardiomegaly. Electronically Signed   By: Genia Del M.D.   On: 12/02/2016 06:34     Not all labs, radiology exams or other studies done during hospitalization come through on my EPIC note; however they are reviewed by me.    Assessment and Plan  SEPSIS/ KLEBSIELLA PYELONEPHRITIS- iproved nicely w/ directed abx tx - given serious nature of her  infection, plan to complete 7 full days of abx tx, w/ her last dose of ceftin being 6/10 PM  SNF - admitted for residential care  AKI/HYPOTENSION - improved with IVF CR 2.87--> 1,43 with reported baseline of 1.2 SNF - will f/u BMP  ACUTE ENCEPHALOPATHY/ DEMENTIA /DEPRESSION- Due to advanced dementia and sepsis - appears to be at her baseline mental status at time of d/c  SNF - cont namenda 10 mg BID  And remeron 15 mg qHDS  CHRONIC dCHF -EF of 50-55% w/ grade 1 diastolic dysfunction - no evidence of volume overload on exam at time of d/c - SNF - will monitor weights as usual; cont metoprolol 12.5 mg TID  AF SNF -rate controlled with metoprolol 12.5 mg TID and prophylaxed with Eliquis 2.5 mg BID  HLD SNF - controlled; cont lipitor 40 mg daily   Time spent > 40 min;> 50% of time with patient was spent reviewing records, labs, tests and studies, counseling and developing plan of care  Webb Silversmith D. Sheppard Coil, MD

## 2016-12-09 ENCOUNTER — Encounter: Payer: Self-pay | Admitting: Internal Medicine

## 2016-12-09 DIAGNOSIS — B961 Klebsiella pneumoniae [K. pneumoniae] as the cause of diseases classified elsewhere: Secondary | ICD-10-CM | POA: Insufficient documentation

## 2016-12-09 DIAGNOSIS — N12 Tubulo-interstitial nephritis, not specified as acute or chronic: Secondary | ICD-10-CM

## 2016-12-09 HISTORY — DX: Tubulo-interstitial nephritis, not specified as acute or chronic: N12

## 2016-12-12 LAB — BASIC METABOLIC PANEL
BUN: 13 (ref 4–21)
CREATININE: 0.7 (ref 0.5–1.1)
Glucose: 86
POTASSIUM: 3.2 — AB (ref 3.4–5.3)
SODIUM: 144 (ref 137–147)

## 2016-12-15 DIAGNOSIS — F419 Anxiety disorder, unspecified: Secondary | ICD-10-CM | POA: Diagnosis not present

## 2016-12-15 DIAGNOSIS — F39 Unspecified mood [affective] disorder: Secondary | ICD-10-CM | POA: Diagnosis not present

## 2016-12-15 DIAGNOSIS — F0390 Unspecified dementia without behavioral disturbance: Secondary | ICD-10-CM | POA: Diagnosis not present

## 2016-12-17 ENCOUNTER — Non-Acute Institutional Stay (SKILLED_NURSING_FACILITY): Payer: Medicare Other

## 2016-12-17 DIAGNOSIS — Z0000 Encounter for general adult medical examination without abnormal findings: Secondary | ICD-10-CM

## 2016-12-17 NOTE — Patient Instructions (Signed)
Cindy Trevino , Thank you for taking time to come for your Medicare Wellness Visit. I appreciate your ongoing commitment to your health goals. Please review the following plan we discussed and let me know if I can assist you in the future.   Screening recommendations/referrals: Colonoscopy up to date, pt over age 81 Mammogram up to date, pt over age 3 Bone Density up to date Recommended yearly ophthalmology/optometry visit for glaucoma screening and checkup Recommended yearly dental visit for hygiene and checkup  Vaccinations: Influenza vaccine due when available Pneumococcal vaccine up to date per [facility] records Tdap vaccine due 01/13/18 Shingles vaccine not in records  Advanced directives: In Chart  Conditions/risks identified: None  Next appointment: Dr. Sheppard Coil does monthly rounds   Preventive Care 65 Years and Older, Female Preventive care refers to lifestyle choices and visits with your health care provider that can promote health and wellness. What does preventive care include?  A yearly physical exam. This is also called an annual well check.  Dental exams once or twice a year.  Routine eye exams. Ask your health care provider how often you should have your eyes checked.  Personal lifestyle choices, including:  Daily care of your teeth and gums.  Regular physical activity.  Eating a healthy diet.  Avoiding tobacco and drug use.  Limiting alcohol use.  Practicing safe sex.  Taking low-dose aspirin every day.  Taking vitamin and mineral supplements as recommended by your health care provider. What happens during an annual well check? The services and screenings done by your health care provider during your annual well check will depend on your age, overall health, lifestyle risk factors, and family history of disease. Counseling  Your health care provider may ask you questions about your:  Alcohol use.  Tobacco use.  Drug use.  Emotional  well-being.  Home and relationship well-being.  Sexual activity.  Eating habits.  History of falls.  Memory and ability to understand (cognition).  Work and work Statistician.  Reproductive health. Screening  You may have the following tests or measurements:  Height, weight, and BMI.  Blood pressure.  Lipid and cholesterol levels. These may be checked every 5 years, or more frequently if you are over 26 years old.  Skin check.  Lung cancer screening. You may have this screening every year starting at age 82 if you have a 30-pack-year history of smoking and currently smoke or have quit within the past 15 years.  Fecal occult blood test (FOBT) of the stool. You may have this test every year starting at age 45.  Flexible sigmoidoscopy or colonoscopy. You may have a sigmoidoscopy every 5 years or a colonoscopy every 10 years starting at age 12.  Hepatitis C blood test.  Hepatitis B blood test.  Sexually transmitted disease (STD) testing.  Diabetes screening. This is done by checking your blood sugar (glucose) after you have not eaten for a while (fasting). You may have this done every 1-3 years.  Bone density scan. This is done to screen for osteoporosis. You may have this done starting at age 60.  Mammogram. This may be done every 1-2 years. Talk to your health care provider about how often you should have regular mammograms. Talk with your health care provider about your test results, treatment options, and if necessary, the need for more tests. Vaccines  Your health care provider may recommend certain vaccines, such as:  Influenza vaccine. This is recommended every year.  Tetanus, diphtheria, and acellular pertussis (Tdap,  Td) vaccine. You may need a Td booster every 10 years.  Zoster vaccine. You may need this after age 20.  Pneumococcal 13-valent conjugate (PCV13) vaccine. One dose is recommended after age 70.  Pneumococcal polysaccharide (PPSV23) vaccine. One  dose is recommended after age 52. Talk to your health care provider about which screenings and vaccines you need and how often you need them. This information is not intended to replace advice given to you by your health care provider. Make sure you discuss any questions you have with your health care provider. Document Released: 07/13/2015 Document Revised: 03/05/2016 Document Reviewed: 04/17/2015 Elsevier Interactive Patient Education  2017 [employer] Prevention in the Home Falls can cause injuries. They can happen to people of all ages. There are many things you can do to make your home safe and to help prevent falls. What can I do on the outside of my home?  Regularly fix the edges of walkways and driveways and fix any cracks.  Remove anything that might make you trip as you walk through a door, such as a raised step or threshold.  Trim any bushes or trees on the path to your home.  Use bright outdoor lighting.  Clear any walking paths of anything that might make someone trip, such as rocks or tools.  Regularly check to see if handrails are loose or broken. Make sure that both sides of any steps have handrails.  Any raised decks and porches should have guardrails on the edges.  Have any leaves, snow, or ice cleared regularly.  Use sand or salt on walking paths during winter.  Clean up any spills in your garage right away. This includes oil or grease spills. What can I do in the bathroom?  Use night lights.  Install grab bars by the toilet and in the tub and shower. Do not use towel bars as grab bars.  Use non-skid mats or decals in the tub or shower.  If you need to sit down in the shower, use a plastic, non-slip stool.  Keep the floor dry. Clean up any water that spills on the floor as soon as it happens.  Remove soap buildup in the tub or shower regularly.  Attach bath mats securely with double-sided non-slip rug tape.  Do not have throw rugs and other  things on the floor that can make you trip. What can I do in the bedroom?  Use night lights.  Make sure that you have a light by your bed that is easy to reach.  Do not use any sheets or blankets that are too big for your bed. They should not hang down onto the floor.  Have a firm chair that has side arms. You can use this for support while you get dressed.  Do not have throw rugs and other things on the floor that can make you trip. What can I do in the kitchen?  Clean up any spills right away.  Avoid walking on wet floors.  Keep items that you use a lot in easy-to-reach places.  If you need to reach something above you, use a strong step stool that has a grab bar.  Keep electrical cords out of the way.  Do not use floor polish or wax that makes floors slippery. If you must use wax, use non-skid floor wax.  Do not have throw rugs and other things on the floor that can make you trip. What can I do with my stairs?  Do not  leave any items on the stairs.  Make sure that there are handrails on both sides of the stairs and use them. Fix handrails that are broken or loose. Make sure that handrails are as long as the stairways.  Check any carpeting to make sure that it is firmly attached to the stairs. Fix any carpet that is loose or worn.  Avoid having throw rugs at the top or bottom of the stairs. If you do have throw rugs, attach them to the floor with carpet tape.  Make sure that you have a light switch at the top of the stairs and the bottom of the stairs. If you do not have them, ask someone to add them for you. What else can I do to help prevent falls?  Wear shoes that:  Do not have high heels.  Have rubber bottoms.  Are comfortable and fit you well.  Are closed at the toe. Do not wear sandals.  If you use a stepladder:  Make sure that it is fully opened. Do not climb a closed stepladder.  Make sure that both sides of the stepladder are locked into place.  Ask  someone to hold it for you, if possible.  Clearly mark and make sure that you can see:  Any grab bars or handrails.  First and last steps.  Where the edge of each step is.  Use tools that help you move around (mobility aids) if they are needed. These include:  Canes.  Walkers.  Scooters.  Crutches.  Turn on the lights when you go into a dark area. Replace any light bulbs as soon as they burn out.  Set up your furniture so you have a clear path. Avoid moving your furniture around.  If any of your floors are uneven, fix them.  If there are any pets around you, be aware of where they are.  Review your medicines with your doctor. Some medicines can make you feel dizzy. This can increase your chance of falling. Ask your doctor what other things that you can do to help prevent falls. This information is not intended to replace advice given to you by your health care provider. Make sure you discuss any questions you have with your health care provider. Document Released: 04/12/2009 Document Revised: 11/22/2015 Document Reviewed: 07/21/2014 Elsevier Interactive Patient Education  2017 [employer].

## 2016-12-17 NOTE — Progress Notes (Signed)
Subjective:   Cindy Trevino is a 83 y.o. female who presents for an Initial Medicare Annual Wellness Visit at [facility]; incapacitated patient unable to answer questions appropriately     Objective:    Today's Vitals   12/17/16 1513  BP: 138/90  Pulse: 72  Temp: 98 F (36.7 C)  TempSrc: Oral  SpO2: 95%  Weight: 162 lb (73.5 kg)  Height: 5\' 3"  (1.6 m)   Body mass index is 28.7 kg/m.   Current Medications (verified) Outpatient Encounter Prescriptions as of 12/17/2016  Medication Sig  . acetaminophen (TYLENOL) 325 MG tablet Take 650 mg by mouth every 6 (six) hours as needed (for pain). Notify MD if pain is not relieved. Do not exceed 3000 mg in 24 hour period  . Amino Acids-Protein Hydrolys (FEEDING SUPPLEMENT, PRO-STAT SUGAR FREE 64,) LIQD Take 30 mLs by mouth daily at 6 PM.   . apixaban (ELIQUIS) 2.5 MG TABS tablet Take 2.5 mg by mouth 2 (two) times daily.  Marland Kitchen atorvastatin (LIPITOR) 40 MG tablet Take 1 tablet (40 mg total) by mouth daily at 6 PM.  . bisacodyl (DULCOLAX) 10 MG suppository Place 10 mg rectally as needed for moderate constipation.  . feeding supplement, ENSURE, (ENSURE) PUDG Take 1 Container by mouth 2 (two) times daily between meals.  . memantine (NAMENDA) 10 MG tablet Take 10 mg by mouth 2 (two) times daily.  . metoprolol tartrate (LOPRESSOR) 25 MG tablet Take 0.5 tablets (12.5 mg total) by mouth 3 (three) times daily.  . Multiple Vitamins-Minerals (PRESERVISION AREDS 2 PO) Take 1 tablet by mouth 2 (two) times daily.   Marland Kitchen Propylene Glycol 0.6 % SOLN Place 1 drop into both eyes 2 (two) times daily.  Marland Kitchen senna (SENOKOT) 8.6 MG TABS tablet Take 1 tablet by mouth daily.  . Sodium Phosphates (RA SALINE ENEMA) 19-7 GM/118ML ENEM Place 1 each rectally as needed (for constipation).  . [DISCONTINUED] mirtazapine (REMERON) 15 MG tablet Take 1/2 tablet ( 7.5 mg ) by mouth at bedtime    No facility-administered encounter medications on file as of 12/17/2016.      Allergies (verified) Patient has no known allergies.   History: Past Medical History:  Diagnosis Date  . Arthritis   . COPD (chronic obstructive pulmonary disease) ([place])   . Dementia 02/02/2012  . History of breast cancer 2009   Left breast  s/p lumpectomy and rads. declined hormonal tx. last mammogram 2011  . Hyperlipidemia   . Hypertension   . PAF (paroxysmal atrial fibrillation) ([place])   . SVT (supraventricular tachycardia) ([facility])    Past Surgical History:  Procedure Laterality Date  . BREAST LUMPECTOMY Left 2009   left breast  . CESAREAN SECTION    . EYE SURGERY     cataracts x3   Family History  Problem Relation Age of Onset  . Heart disease Mother   . Diabetes Mother   . Heart disease Father   . Diabetes Sister   . Hypertension Sister    Social History   Occupational History  . Not on file.   Social History Main Topics  . Smoking status: Never Smoker  . Smokeless tobacco: Never Used  . Alcohol use No  . Drug use: No  . Sexual activity: Not on file    Tobacco Counseling Counseling given: Not Answered   Activities of Daily Living In your present state of health, do you have any difficulty performing the following activities: 12/17/2016 12/03/2016  Hearing? Cindy Trevino  Y  Vision? N N  Difficulty concentrating or making decisions? Cindy Trevino  Walking or climbing stairs? Y Y  Dressing or bathing? Y Y  Doing errands, shopping? Cindy Trevino  Preparing Food and eating ? Y -  In the past six months, have you accidently leaked urine? Y -  Do you have problems with loss of bowel control? Y -  Managing your Medications? Y -  Managing your Finances? Y -  Housekeeping or managing your Housekeeping? Y -  Some recent data might be hidden    Immunizations and Health Maintenance Immunization History  Administered Date(s) Administered  . Influenza Inj Mdck Quad Pf 03/09/2012  . Influenza Split 04/20/2009, 05/12/2010  . Influenza, Seasonal, Injecte, Preservative Fre 03/21/2013  .  Influenza-Unspecified 03/22/2015  . PPD Test 05/08/2014  . Pneumococcal Polysaccharide-23 05/12/2010  . Td 01/14/2008  . Zoster 01/14/2008   There are no preventive care reminders to display for this patient.  Patient Care Team: Hennie Duos, MD as PCP - General (Internal Medicine)  Indicate any recent Medical Services you may have received from other than [facility] providers in the past year (date may be approximate).     Assessment:   This is a routine wellness examination for [name].   Hearing/Vision screen No exam data present  Dietary issues and exercise activities discussed: Current Exercise Habits: The patient does not participate in regular exercise at present, Exercise limited by: neurologic condition(s)  Goals    None     Depression Screen PHQ 2/9 Scores 12/17/2016  Exception Documentation Medical reason    Fall Risk Fall Risk  12/17/2016  Falls in the past year? No    Cognitive Function:     6CIT Screen 12/17/2016  What Year? 4 points  What month? 0 points  What time? 3 points  Count back from 20 4 points  Months in reverse 4 points  Repeat phrase 10 points  Total Score 25    Screening Tests Health Maintenance  Topic Date Due  . PNA vac Low Risk Adult (2 of 2 - PCV13) 06/30/2017 (Originally 05/13/2011)  . TETANUS/TDAP  09/28/2023 (Originally 01/13/2018)  . INFLUENZA VACCINE  01/28/2017  . DEXA SCAN  Completed      Plan:    I have personally reviewed and addressed the Medicare Annual Wellness questionnaire and have noted the following in the patient's chart:  A. Medical and social history B. Use of alcohol, tobacco or illicit drugs  C. Current medications and supplements D. Functional ability and status E.  Nutritional status F.  Physical activity G. Advance directives H. List of other physicians I.  Hospitalizations, surgeries, and ER visits in previous 12 months J.  [place] to include hearing, vision, cognitive,  depression L. Referrals and appointments - none  In addition, I have reviewed and discussed with patient certain preventive protocols, quality metrics, and best practice recommendations. A written personalized care plan for preventive services as well as general preventive health recommendations were provided to patient.  See attached scanned questionnaire for additional information.   Signed,   Rich Reining, RN Nurse Health Advisor   Quick Notes   Health Maintenance: PNA completed per notes in chart. Flu due when available     Abnormal Screen: 6 CIT-25     Patient Concerns: None     Nurse Concerns: None

## 2017-01-05 DIAGNOSIS — H353221 Exudative age-related macular degeneration, left eye, with active choroidal neovascularization: Secondary | ICD-10-CM | POA: Diagnosis not present

## 2017-01-05 DIAGNOSIS — H35433 Paving stone degeneration of retina, bilateral: Secondary | ICD-10-CM | POA: Diagnosis not present

## 2017-01-05 DIAGNOSIS — H353112 Nonexudative age-related macular degeneration, right eye, intermediate dry stage: Secondary | ICD-10-CM | POA: Diagnosis not present

## 2017-01-08 ENCOUNTER — Encounter: Payer: Self-pay | Admitting: Internal Medicine

## 2017-01-08 NOTE — Assessment & Plan Note (Signed)
Patient is followed by Dr. who comes at the facility; continue PreserVision AREDS 2 one by mouth twice a day

## 2017-01-08 NOTE — Assessment & Plan Note (Signed)
Mood has been stable now the Depakote has been DC; will continue to monitor

## 2017-01-08 NOTE — Assessment & Plan Note (Signed)
Patient is doing well off of her Ativan at noon; continue to monitor

## 2017-01-09 ENCOUNTER — Non-Acute Institutional Stay (SKILLED_NURSING_FACILITY): Payer: Medicare Other | Admitting: Internal Medicine

## 2017-01-09 ENCOUNTER — Encounter: Payer: Self-pay | Admitting: Internal Medicine

## 2017-01-09 DIAGNOSIS — N183 Chronic kidney disease, stage 3 unspecified: Secondary | ICD-10-CM

## 2017-01-09 DIAGNOSIS — F32A Depression, unspecified: Secondary | ICD-10-CM

## 2017-01-09 DIAGNOSIS — I10 Essential (primary) hypertension: Secondary | ICD-10-CM | POA: Diagnosis not present

## 2017-01-09 DIAGNOSIS — F329 Major depressive disorder, single episode, unspecified: Secondary | ICD-10-CM | POA: Diagnosis not present

## 2017-01-09 NOTE — Progress Notes (Signed)
Location:  [facility] Room Number: 203D Place of Service:  SNF ([id number])  Hennie Duos, MD  Patient Care Team: Hennie Duos, MD as PCP - General (Internal Medicine)  Extended Emergency Contact Information Primary Emergency Contact: [facility] Address: [place], [place], [place] [address] [name] Phone: [phone] Relation: Grandson    Allergies: Patient has no known allergies.  Chief Complaint  Patient presents with  . Medical Management of Chronic Issues    routine visit    HPI: Patient is 83 y.o. female who Who is being seen for routine issues of depression, chronic kidney disease stage III, and hypertension   Past Medical History:  Diagnosis Date  . Anemia of chronic disease 08/04/2014  . Anxiety 07/04/2015  . Arthritis   . Basal ganglia infarction ([place]) 04/17/2014  . Chronic diastolic CHF (congestive heart failure) ([place]) 12/02/2016  . CKD (chronic kidney disease) stage 3, GFR 30-59 ml/min 08/04/2014  . COPD (chronic obstructive pulmonary disease) ([place])   . Dementia 02/02/2012  . Depression 04/23/2016  . DVT of lower extremity (deep venous thrombosis) ([place]) 01/23/2015  . Essential hypertension 04/20/2014  . History of breast cancer 2009   Left breast  s/p lumpectomy and rads. declined hormonal tx. last mammogram 2011  . Hyperlipidemia   . Hypertension   . OA (osteoarthritis) of knee 09/20/2014  . PAF (paroxysmal atrial fibrillation) ([place])   . Paroxysmal atrial fibrillation ([place]) 04/20/2014  . Pyelonephritis 12/09/2016  . SVT (supraventricular tachycardia) ([place])   . Vascular dementia with behavior disturbance 04/20/2014    Past Surgical History:  Procedure Laterality Date  . BREAST LUMPECTOMY Left 2009   left breast  . CESAREAN SECTION    . EYE SURGERY     cataracts x3    Allergies as of 01/09/2017   No Known Allergies     Medication List       Accurate as of 01/09/17 12:47 PM. Always use your  most recent med list.          acetaminophen 325 MG tablet Commonly known as:  TYLENOL Take 650 mg by mouth every 6 (six) hours as needed (for pain). Notify MD if pain is not relieved. Do not exceed 3000 mg in 24 hour period   atorvastatin 40 MG tablet Commonly known as:  LIPITOR Take 1 tablet (40 mg total) by mouth daily at 6 PM.   bisacodyl 10 MG suppository Commonly known as:  DULCOLAX Place 10 mg rectally as needed for moderate constipation.   ELIQUIS 2.5 MG Tabs tablet Generic drug:  apixaban Take 2.5 mg by mouth 2 (two) times daily.   feeding supplement (ENSURE) Pudg Take 1 Container by mouth 2 (two) times daily between meals.   feeding supplement (PRO-STAT SUGAR FREE 64) Liqd Take 30 mLs by mouth daily at 6 PM.   memantine 10 MG tablet Commonly known as:  NAMENDA Take 10 mg by mouth 2 (two) times daily.   metoprolol tartrate 25 MG tablet Commonly known as:  LOPRESSOR Take 0.5 tablets (12.5 mg total) by mouth 3 (three) times daily.   PRESERVISION AREDS 2 PO Take 1 tablet by mouth 2 (two) times daily.   Propylene Glycol 0.6 % Soln Place 1 drop into both eyes 2 (two) times daily.   RA SALINE ENEMA 19-7 GM/118ML Enem Place 1 each rectally as needed (for constipation).   senna 8.6 MG Tabs tablet Commonly known as:  SENOKOT Take 1 tablet by mouth daily.       No orders of the defined types were placed in this encounter.   Immunization History  Administered Date(s) Administered  . Influenza Inj Mdck Quad Pf 03/09/2012  . Influenza Split 04/20/2009, 05/12/2010  . Influenza, Seasonal, Injecte, Preservative Fre 03/21/2013  . Influenza-Unspecified 03/22/2015  . PPD Test 05/08/2014  . Pneumococcal Polysaccharide-23 05/12/2010  . Td 01/14/2008  . Zoster 01/14/2008    Social History  Substance Use Topics  . Smoking status: Never Smoker  . Smokeless tobacco: Never Used  . Alcohol use No    Review of Systems  DATA OBTAINED: from Nurse GENERAL:  no  fevers, fatigue, appetite changes SKIN: No itching, rash HEENT: No complaint RESPIRATORY: No cough, wheezing, SOB CARDIAC: No chest pain, palpitations, lower extremity edema  GI: No abdominal pain, No N/V/D or constipation, No heartburn or reflux  GU: No dysuria, frequency or urgency, or incontinence  MUSCULOSKELETAL: No unrelieved bone/joint pain NEUROLOGIC: No headache, dizziness  PSYCHIATRIC: No overt anxiety or sadness  Vitals:   01/09/17 1231  BP: 127/80  Pulse: 69  Resp: 18  Temp: (!) 97 F (36.1 C)   Body mass index is 29.02 kg/m. Physical Exam  GENERAL APPEARANCE: Alert, Moderately conversant, No acute distress  SKIN: No diaphoresis rash HEENT: Unremarkable RESPIRATORY: Breathing is even, unlabored. Lung sounds are clear   CARDIOVASCULAR: Heart RRR no murmurs, rubs or gallops. No peripheral edema  GASTROINTESTINAL: Abdomen is soft, non-tender, not distended w/ normal bowel sounds.  GENITOURINARY: Bladder non tender, not distended  MUSCULOSKELETAL: No abnormal joints or musculature NEUROLOGIC: Cranial nerves 2-12 grossly intact. Moves all extremities PSYCHIATRIC: Dementia, no behavioral issues  Patient Active Problem List   Diagnosis Date Noted  . Klebsiella pneumoniae (k. pneumoniae) as the cause of diseases classified elsewhere 12/09/2016  . Pyelonephritis 12/09/2016  . Chronic diastolic CHF (congestive heart failure) ([place]) 12/02/2016  . Protein calorie malnutrition ([place]) 12/02/2016  . Macular degeneration 09/06/2016  . Depression 04/23/2016  . Poor appetite 10/11/2015  . Loss of weight 10/11/2015  . Diarrhea 09/10/2015  . Anxiety 07/04/2015  . Mood disorder ([place]) 07/04/2015  . DVT of lower extremity (deep venous thrombosis) ([place]) 01/23/2015  . OA (osteoarthritis) of knee 09/20/2014  . CKD (chronic kidney disease) stage 3, GFR 30-59 ml/min 08/04/2014  . Anemia of chronic disease 08/04/2014  . Hyperlipidemia   . Essential hypertension 04/20/2014  .  Vascular dementia with behavior disturbance 04/20/2014  . Paroxysmal atrial fibrillation ([place]) 04/20/2014  . Basal ganglia infarction ([place]) 04/17/2014  . History of breast cancer 08/23/2012    CMP     Component Value Date/Time   NA 144 12/12/2016   K 3.2 (A) 12/12/2016   CL 105 12/06/2016 0346   CO2 20 (L) 12/06/2016 0346   GLUCOSE 85 12/06/2016 0346   BUN 13 12/12/2016   CREATININE 0.7 12/12/2016   CREATININE 1.43 (H) 12/06/2016 0346   CALCIUM 7.6 (L) 12/06/2016 0346   PROT 6.3 (L) 12/02/2016 0556   ALBUMIN 2.3 (L) 12/02/2016 0556   AST 16 12/02/2016 0556   ALT 13 (L) 12/02/2016 0556   ALKPHOS 74 12/02/2016 0556   BILITOT 0.9 12/02/2016 0556   GFRNONAA 31 (L) 12/06/2016 0346   GFRAA 36 (L) 12/06/2016 0346    Recent Labs  12/03/16 0103  12/04/16 0314 12/06/16 12/06/16 0346 12/12/16  NA 136  < > 135 134* 134* 144  K 3.9  < > 4.1  --  3.7 3.2*  CL 107  --  106  --  105  --   CO2 18*  --  19*  --  20*  --   GLUCOSE 131*  --  124*  --  85  --   BUN 33*  < > 27* 23* 23* 13  CREATININE 2.28*  < > 1.93* 1.4* 1.43* 0.7  CALCIUM 7.2*  --  7.6*  --  7.6*  --   < > = values in this interval not displayed.  Recent Labs  01/30/16 08/12/16 12/02/16 0556  AST 16 14 16   ALT 8 10 13*  ALKPHOS 58 64 74  BILITOT  --   --  0.9  PROT  --   --  6.3*  ALBUMIN  --   --  2.3*    Recent Labs  12/02/16 0556  12/03/16 0103 12/04/16 12/04/16 0314 12/06/16 12/06/16 0346  WBC 21.5*  < > 13.1* 10.8 10.8* 7.2 7.2  NEUTROABS 17.8*  --   --   --   --   --   --   HGB 10.5*  < > 9.7* 10.5* 10.5*  --  10.1*  HCT 34.1*  < > 32.3* 34* 34.3*  --  32.7*  MCV 89.5  --  90.0  --  88.6  --  88.9  PLT 341  < > 258 230 230  --  192  < > = values in this interval not displayed.  Recent Labs  01/30/16 08/12/16  CHOL 112 89  LDLCALC 60 49  TRIG 130 108   No results found for: [facility] Lab Results  Component Value Date   TSH 4.09 08/12/2016   Lab Results  Component Value Date   HGBA1C  6.1 08/12/2016   Lab Results  Component Value Date   CHOL 89 08/12/2016   HDL 24 (A) 08/12/2016   LDLCALC 49 08/12/2016   TRIG 108 08/12/2016   CHOLHDL 5.7 04/18/2014    Significant Diagnostic Results in last 30 days:  No results found.  Assessment and Plan  HYPERTENSION -sTable; plan to continue metoprolol 12.5 mg by mouth 3 times a day  CK D3-Most recent GFR is 31; most recent creatinine is 1 which appears to be her good baseline; we'll monitor intervals  DEPRESSION-stable describing off for ; Will continue to monitor   Sherlin Sonier D. Sheppard Coil, MD

## 2017-01-12 DIAGNOSIS — E559 Vitamin D deficiency, unspecified: Secondary | ICD-10-CM | POA: Diagnosis not present

## 2017-01-12 DIAGNOSIS — E039 Hypothyroidism, unspecified: Secondary | ICD-10-CM | POA: Diagnosis not present

## 2017-01-12 DIAGNOSIS — I10 Essential (primary) hypertension: Secondary | ICD-10-CM | POA: Diagnosis not present

## 2017-01-12 DIAGNOSIS — D649 Anemia, unspecified: Secondary | ICD-10-CM | POA: Diagnosis not present

## 2017-01-12 DIAGNOSIS — E785 Hyperlipidemia, unspecified: Secondary | ICD-10-CM | POA: Diagnosis not present

## 2017-01-12 DIAGNOSIS — E119 Type 2 diabetes mellitus without complications: Secondary | ICD-10-CM | POA: Diagnosis not present

## 2017-01-12 LAB — BASIC METABOLIC PANEL
BUN: 22 — AB (ref 4–21)
Creatinine: 1.1 (ref 0.5–1.1)
Glucose: 82
Potassium: 4.4 (ref 3.4–5.3)
SODIUM: 143 (ref 137–147)

## 2017-01-12 LAB — VITAMIN D 25 HYDROXY (VIT D DEFICIENCY, FRACTURES): Vit D, 25-Hydroxy: 32.02

## 2017-01-12 LAB — CBC AND DIFFERENTIAL
HCT: 31 — AB (ref 36–46)
HEMOGLOBIN: 10 — AB (ref 12.0–16.0)
Platelets: 220 (ref 150–399)
WBC: 7.3

## 2017-01-12 LAB — HEPATIC FUNCTION PANEL
ALT: 10 (ref 7–35)
AST: 15 (ref 13–35)
Alkaline Phosphatase: 66 (ref 25–125)
BILIRUBIN, TOTAL: 0.3

## 2017-01-12 LAB — LIPID PANEL
CHOLESTEROL: 97 (ref 0–200)
HDL: 28 — AB (ref 35–70)
LDL Cholesterol: 57
TRIGLYCERIDES: 102 (ref 40–160)

## 2017-01-12 LAB — HEMOGLOBIN A1C: HEMOGLOBIN A1C: 5.5

## 2017-01-12 LAB — TSH: TSH: 4.8 (ref 0.41–5.90)

## 2017-01-19 DIAGNOSIS — F39 Unspecified mood [affective] disorder: Secondary | ICD-10-CM | POA: Diagnosis not present

## 2017-01-19 DIAGNOSIS — F419 Anxiety disorder, unspecified: Secondary | ICD-10-CM | POA: Diagnosis not present

## 2017-01-19 DIAGNOSIS — F0390 Unspecified dementia without behavioral disturbance: Secondary | ICD-10-CM | POA: Diagnosis not present

## 2017-01-22 ENCOUNTER — Other Ambulatory Visit: Payer: Self-pay

## 2017-02-09 ENCOUNTER — Non-Acute Institutional Stay (SKILLED_NURSING_FACILITY): Payer: Medicare Other | Admitting: Internal Medicine

## 2017-02-09 ENCOUNTER — Encounter: Payer: Self-pay | Admitting: Internal Medicine

## 2017-02-09 DIAGNOSIS — E784 Other hyperlipidemia: Secondary | ICD-10-CM

## 2017-02-09 DIAGNOSIS — D638 Anemia in other chronic diseases classified elsewhere: Secondary | ICD-10-CM

## 2017-02-09 DIAGNOSIS — E7849 Other hyperlipidemia: Secondary | ICD-10-CM

## 2017-02-09 DIAGNOSIS — F0151 Vascular dementia with behavioral disturbance: Secondary | ICD-10-CM

## 2017-02-09 DIAGNOSIS — F01518 Vascular dementia, unspecified severity, with other behavioral disturbance: Secondary | ICD-10-CM

## 2017-02-09 NOTE — Progress Notes (Signed)
Location:  [place] Room Number: [place]:  SNF (31)  Cindy Duos, MD  Patient Care Team: Cindy Duos, MD as PCP - General (Internal Medicine)  Extended Emergency Contact Information Primary Emergency Contact: [facility] Address: [place], [place] [address] [name] of [place] Phone: [phone] Relation: Grandson    Allergies: Patient has no known allergies.  Chief Complaint  Patient presents with  . Medical Management of Chronic Issues    routine visit    HPI: Patient is 83 y.o. female who Is being seen for routine issues of hyperlipidemia, anemia of chronic disease, and dementia.  Past Medical History:  Diagnosis Date  . Anemia of chronic disease 08/04/2014  . Anxiety 07/04/2015  . Arthritis   . Basal ganglia infarction ([place]) 04/17/2014  . Chronic diastolic CHF (congestive heart failure) ([place]) 12/02/2016  . CKD (chronic kidney disease) stage 3, GFR 30-59 ml/min 08/04/2014  . COPD (chronic obstructive pulmonary disease) ([place])   . Dementia 02/02/2012  . Depression 04/23/2016  . DVT of lower extremity (deep venous thrombosis) ([place]) 01/23/2015  . Essential hypertension 04/20/2014  . History of breast cancer 2009   Left breast  s/p lumpectomy and rads. declined hormonal tx. last mammogram 2011  . Hyperlipidemia   . Hypertension   . OA (osteoarthritis) of knee 09/20/2014  . PAF (paroxysmal atrial fibrillation) ([place])   . Paroxysmal atrial fibrillation ([place]) 04/20/2014  . Pyelonephritis 12/09/2016  . SVT (supraventricular tachycardia) ([place])   . Vascular dementia with behavior disturbance 04/20/2014    Past Surgical History:  Procedure Laterality Date  . BREAST LUMPECTOMY Left 2009   left breast  . CESAREAN SECTION    . EYE SURGERY     cataracts x3    Allergies as of 02/09/2017   No Known Allergies     Medication List       Accurate as of 02/09/17 11:59 PM. Always use your most recent  med list.          acetaminophen 325 MG tablet Commonly known as:  TYLENOL Take 650 mg by mouth every 6 (six) hours as needed (for pain). Notify MD if pain is not relieved. Do not exceed 3000 mg in 24 hour period   atorvastatin 40 MG tablet Commonly known as:  LIPITOR Take 1 tablet (40 mg total) by mouth daily at 6 PM.   bisacodyl 10 MG suppository Commonly known as:  DULCOLAX Place 10 mg rectally as needed for moderate constipation.   ELIQUIS 2.5 MG Tabs tablet Generic drug:  apixaban Take 2.5 mg by mouth 2 (two) times daily.   feeding supplement (ENSURE) Pudg Take 1 Container by mouth 2 (two) times daily between meals.   feeding supplement (PRO-STAT SUGAR FREE 64) Liqd Take 30 mLs by mouth daily at 6 PM.   memantine 10 MG tablet Commonly known as:  NAMENDA Take 10 mg by mouth 2 (two) times daily.   metoprolol tartrate 25 MG tablet Commonly known as:  LOPRESSOR Take 0.5 tablets (12.5 mg total) by mouth 3 (three) times daily.   PRESERVISION AREDS 2 PO Take 1 tablet by mouth 2 (two) times daily.   Propylene Glycol 0.6 % Soln Place 1 drop into both eyes 2 (two) times daily.   RA SALINE ENEMA 19-7 GM/118ML Enem Place 1 each rectally as needed (for constipation).   senna 8.6 MG Tabs tablet Commonly known as:  SENOKOT Take  1 tablet by mouth daily.       No orders of the defined types were placed in this encounter.   Immunization History  Administered Date(s) Administered  . Influenza Inj Mdck Quad Pf 03/09/2012  . Influenza Split 04/20/2009, 05/12/2010  . Influenza, Seasonal, Injecte, Preservative Fre 03/21/2013  . Influenza-Unspecified 03/22/2015  . PPD Test 05/08/2014  . Pneumococcal Polysaccharide-23 05/12/2010  . Td 01/14/2008  . Zoster 01/14/2008    Social History  Substance Use Topics  . Smoking status: Never Smoker  . Smokeless tobacco: Never Used  . Alcohol use No    Review of Systems-Unable to obtain secondary to dementia; nursing/no  concerns    Vitals:   02/09/17 1544  BP: 139/78  Pulse: 66  Resp: 18  Temp: (!) 97.2 F (36.2 C)   Body mass index is 28.87 kg/m. Physical Exam  GENERAL APPEARANCE: Alert, conversant, No acute distress , Sitting every day as she sits at the nursing station daily in her wheelchair SKIN: No diaphoresis rash HEENT: Unremarkable RESPIRATORY: Breathing is even, unlabored. Lung sounds are clear   CARDIOVASCULAR: Heart RRR no murmurs, rubs or gallops. No peripheral edema  GASTROINTESTINAL: Abdomen is soft, non-tender, not distended w/ normal bowel sounds.  GENITOURINARY: Bladder non tender, not distended  MUSCULOSKELETAL: No abnormal joints or musculature NEUROLOGIC: Cranial nerves 2-12 grossly intact. Moves all extremities PSYCHIATRIC: Dementia, no behavioral issues  Patient's physical exam has not changed since prior visit  Patient Active Problem List   Diagnosis Date Noted  . Klebsiella pneumoniae (k. pneumoniae) as the cause of diseases classified elsewhere 12/09/2016  . Pyelonephritis 12/09/2016  . Chronic diastolic CHF (congestive heart failure) ([place]) 12/02/2016  . Protein calorie malnutrition ([place]) 12/02/2016  . Macular degeneration 09/06/2016  . Depression 04/23/2016  . Poor appetite 10/11/2015  . Loss of weight 10/11/2015  . Diarrhea 09/10/2015  . Anxiety 07/04/2015  . Mood disorder ([place]) 07/04/2015  . DVT of lower extremity (deep venous thrombosis) ([place]) 01/23/2015  . OA (osteoarthritis) of knee 09/20/2014  . CKD (chronic kidney disease) stage 3, GFR 30-59 ml/min 08/04/2014  . Anemia of chronic disease 08/04/2014  . Hyperlipidemia   . Essential hypertension 04/20/2014  . Vascular dementia with behavior disturbance 04/20/2014  . Paroxysmal atrial fibrillation ([place]) 04/20/2014  . Basal ganglia infarction ([place]) 04/17/2014  . History of breast cancer 08/23/2012    CMP     Component Value Date/Time   NA 143 01/12/2017   K 4.4 01/12/2017   CL 105 12/06/2016  0346   CO2 20 (L) 12/06/2016 0346   GLUCOSE 85 12/06/2016 0346   BUN 22 (A) 01/12/2017   CREATININE 1.1 01/12/2017   CREATININE 1.43 (H) 12/06/2016 0346   CALCIUM 7.6 (L) 12/06/2016 0346   PROT 6.3 (L) 12/02/2016 0556   ALBUMIN 2.3 (L) 12/02/2016 0556   AST 15 01/12/2017   ALT 10 01/12/2017   ALKPHOS 66 01/12/2017   BILITOT 0.9 12/02/2016 0556   GFRNONAA 31 (L) 12/06/2016 0346   GFRAA 36 (L) 12/06/2016 0346    Recent Labs  12/03/16 0103  12/04/16 0314  12/06/16 0346 12/12/16 01/12/17  NA 136  < > 135  < > 134* 144 143  K 3.9  < > 4.1  --  3.7 3.2* 4.4  CL 107  --  106  --  105  --   --   CO2 18*  --  19*  --  20*  --   --   GLUCOSE 131*  --  124*  --  85  --   --   BUN 33*  < > 27*  < > 23* 13 22*  CREATININE 2.28*  < > 1.93*  < > 1.43* 0.7 1.1  CALCIUM 7.2*  --  7.6*  --  7.6*  --   --   < > = values in this interval not displayed.  Recent Labs  08/12/16 12/02/16 0556 01/12/17  AST 14 16 15   ALT 10 13* 10  ALKPHOS 64 74 66  BILITOT  --  0.9  --   PROT  --  6.3*  --   ALBUMIN  --  2.3*  --     Recent Labs  12/02/16 0556  12/03/16 0103  12/04/16 0314 12/06/16 12/06/16 0346 01/12/17  WBC 21.5*  < > 13.1*  < > 10.8* 7.2 7.2 7.3  NEUTROABS 17.8*  --   --   --   --   --   --   --   HGB 10.5*  < > 9.7*  < > 10.5*  --  10.1* 10.0*  HCT 34.1*  < > 32.3*  < > 34.3*  --  32.7* 31*  MCV 89.5  --  90.0  --  88.6  --  88.9  --   PLT 341  < > 258  < > 230  --  192 220  < > = values in this interval not displayed.  Recent Labs  08/12/16 01/12/17  CHOL 89 97  LDLCALC 49 57  TRIG 108 102   No results found for: [facility] Lab Results  Component Value Date   TSH 4.80 01/12/2017   Lab Results  Component Value Date   HGBA1C 5.5 01/12/2017   Lab Results  Component Value Date   CHOL 97 01/12/2017   HDL 28 (A) 01/12/2017   LDLCALC 57 01/12/2017   TRIG 102 01/12/2017   CHOLHDL 5.7 04/18/2014    Significant Diagnostic Results in last 30 days:  No results  found.  Assessment and Plan  Hyperlipidemia Recent LDL is 57, HDL 28; very good control; patient is on Lipitor 40, will decrease to Lipitor 20 and see what kind of control we have  Anemia of chronic disease Recent hemoglobin is 10.0 which is stable from prior; iron studies and start patient on iron 325 mg by mouth daily  Vascular dementia with behavior disturbance Stable; no major declines; plan to continue Namenda 10 mg by mouth twice a day    Webb Silversmith D. Sheppard Coil, MD

## 2017-02-12 NOTE — Assessment & Plan Note (Signed)
Most recent GFR is 31; most recent creatinine is 1 which appears to be her good baseline; we'll monitor intervals

## 2017-02-12 NOTE — Assessment & Plan Note (Addendum)
sTable; plan to continue metoprolol 12.5 mg by mouth 3 times a day

## 2017-02-12 NOTE — Assessment & Plan Note (Signed)
Stable despite being off Remeron; will continue to monitor

## 2017-02-21 ENCOUNTER — Encounter: Payer: Self-pay | Admitting: Internal Medicine

## 2017-02-21 NOTE — Assessment & Plan Note (Signed)
Recent hemoglobin is 10.0 which is stable from prior; iron studies and start patient on iron 325 mg by mouth daily

## 2017-02-21 NOTE — Assessment & Plan Note (Signed)
Recent LDL is 57, HDL 28; very good control; patient is on Lipitor 40, will decrease to Lipitor 20 and see what kind of control we have

## 2017-02-21 NOTE — Assessment & Plan Note (Signed)
Stable; no major declines; plan to continue Namenda 10 mg by mouth twice a day

## 2017-02-24 DIAGNOSIS — D649 Anemia, unspecified: Secondary | ICD-10-CM | POA: Diagnosis not present

## 2017-02-24 DIAGNOSIS — D509 Iron deficiency anemia, unspecified: Secondary | ICD-10-CM | POA: Diagnosis not present

## 2017-02-24 DIAGNOSIS — D519 Vitamin B12 deficiency anemia, unspecified: Secondary | ICD-10-CM | POA: Diagnosis not present

## 2017-02-26 DIAGNOSIS — F39 Unspecified mood [affective] disorder: Secondary | ICD-10-CM | POA: Diagnosis not present

## 2017-02-26 DIAGNOSIS — F419 Anxiety disorder, unspecified: Secondary | ICD-10-CM | POA: Diagnosis not present

## 2017-02-26 DIAGNOSIS — F0390 Unspecified dementia without behavioral disturbance: Secondary | ICD-10-CM | POA: Diagnosis not present

## 2017-03-06 ENCOUNTER — Non-Acute Institutional Stay (SKILLED_NURSING_FACILITY): Payer: Medicare Other | Admitting: Internal Medicine

## 2017-03-06 ENCOUNTER — Encounter: Payer: Self-pay | Admitting: Internal Medicine

## 2017-03-06 DIAGNOSIS — M1712 Unilateral primary osteoarthritis, left knee: Secondary | ICD-10-CM | POA: Diagnosis not present

## 2017-03-06 DIAGNOSIS — I5032 Chronic diastolic (congestive) heart failure: Secondary | ICD-10-CM

## 2017-03-06 DIAGNOSIS — I480 Paroxysmal atrial fibrillation: Secondary | ICD-10-CM | POA: Diagnosis not present

## 2017-03-06 NOTE — Progress Notes (Signed)
Location:  [place] Room Number: 203D Place of Service:  SNF (31)  Hennie Duos, MD  Patient Care Team: Hennie Duos, MD as PCP - General (Internal Medicine)  Extended Emergency Contact Information Primary Emergency Contact: [facility] Address: [place], [place]  [address] [name] of [place] Phone: [phone] Relation: Grandson    Allergies: Patient has no known allergies.  Chief Complaint  Patient presents with  . Medical Management of Chronic Issues    routine exam    HPI: Patient is 83 y.o. female who Is being seen for routine issues of congestive heart failure, atrial fibrillation and osteoarthritis.  Past Medical History:  Diagnosis Date  . Anemia of chronic disease 08/04/2014  . Anxiety 07/04/2015  . Arthritis   . Basal ganglia infarction ([place]) 04/17/2014  . Chronic diastolic CHF (congestive heart failure) ([place]) 12/02/2016  . CKD (chronic kidney disease) stage 3, GFR 30-59 ml/min 08/04/2014  . COPD (chronic obstructive pulmonary disease) ([place])   . Dementia 02/02/2012  . Depression 04/23/2016  . DVT of lower extremity (deep venous thrombosis) ([place]) 01/23/2015  . Essential hypertension 04/20/2014  . History of breast cancer 2009   Left breast  s/p lumpectomy and rads. declined hormonal tx. last mammogram 2011  . Hyperlipidemia   . Hypertension   . OA (osteoarthritis) of knee 09/20/2014  . PAF (paroxysmal atrial fibrillation) ([place])   . Paroxysmal atrial fibrillation ([place]) 04/20/2014  . Pyelonephritis 12/09/2016  . SVT (supraventricular tachycardia) ([place])   . Vascular dementia with behavior disturbance 04/20/2014    Past Surgical History:  Procedure Laterality Date  . BREAST LUMPECTOMY Left 2009   left breast  . CESAREAN SECTION    . EYE SURGERY     cataracts x3    Allergies as of 03/06/2017   No Known Allergies     Medication List       Accurate as of 03/06/17 11:59 PM. Always use your most  recent med list.          atorvastatin 20 MG tablet Commonly known as:  LIPITOR Take 20 mg by mouth. Take one tablet daily   bisacodyl 10 MG suppository Commonly known as:  DULCOLAX Place 10 mg rectally as needed for moderate constipation.   ELIQUIS 2.5 MG Tabs tablet Generic drug:  apixaban Take 2.5 mg by mouth 2 (two) times daily.   feeding supplement (ENSURE) Pudg Take 1 Container by mouth 2 (two) times daily between meals.   ferrous sulfate 325 (65 FE) MG tablet Take 325 mg by mouth. Take one tablet daily for anemia   LORazepam 0.5 MG tablet Commonly known as:  ATIVAN Take 0.5 mg by mouth. Take one tablet daily at lunch for anxiety   memantine 10 MG tablet Commonly known as:  NAMENDA Take 10 mg by mouth 2 (two) times daily.   metoprolol tartrate 25 MG tablet Commonly known as:  LOPRESSOR Take 0.5 tablets (12.5 mg total) by mouth 3 (three) times daily.   PRESERVISION AREDS 2 PO Take 1 tablet by mouth 2 (two) times daily.   Propylene Glycol 0.6 % Soln Place 1 drop into both eyes 2 (two) times daily.   RA SALINE ENEMA 19-7 GM/118ML Enem Place 1 each rectally as needed (for constipation).   senna 8.6 MG Tabs tablet Commonly known as:  SENOKOT Take 1 tablet by mouth daily.       Meds ordered this encounter  Medications  .  atorvastatin (LIPITOR) 20 MG tablet    Sig: Take 20 mg by mouth. Take one tablet daily  . LORazepam (ATIVAN) 0.5 MG tablet    Sig: Take 0.5 mg by mouth. Take one tablet daily at lunch for anxiety  . ferrous sulfate 325 (65 FE) MG tablet    Sig: Take 325 mg by mouth. Take one tablet daily for anemia    Immunization History  Administered Date(s) Administered  . Influenza Inj Mdck Quad Pf 03/09/2012  . Influenza Split 04/20/2009, 05/12/2010  . Influenza, Seasonal, Injecte, Preservative Fre 03/21/2013  . Influenza-Unspecified 03/22/2015  . PPD Test 05/08/2014  . Pneumococcal Polysaccharide-23 05/12/2010  . Td 01/14/2008  . Zoster  01/14/2008    Social History  Substance Use Topics  . Smoking status: Never Smoker  . Smokeless tobacco: Never Used  . Alcohol use No    Review of SystemsUnable to obtain secondary to dementia; nursing-no concerns    Vitals:   03/06/17 1347  BP: 139/78  Pulse: 66  Resp: 18  Temp: (!) 97.2 F (36.2 C)   Body mass index is 28.87 kg/m. Physical Exam  GENERAL APPEARANCE: Alert, conversant, No acute distress  SKIN: No diaphoresis rash HEENT: Unremarkable RESPIRATORY: Breathing is even, unlabored. Lung sounds are clear   CARDIOVASCULAR: Heart RRR no murmurs, rubs or gallops. No peripheral edema  GASTROINTESTINAL: Abdomen is soft, non-tender, not distended w/ normal bowel sounds.  GENITOURINARY: Bladder non tender, not distended  MUSCULOSKELETAL: No abnormal joints or musculature NEUROLOGIC: Cranial nerves 2-12 grossly intact. Moves all extremities PSYCHIATRIC: Dementia, no behavioral issues  Physical examination has not changed since prior visit.  Patient Active Problem List   Diagnosis Date Noted  . Klebsiella pneumoniae (k. pneumoniae) as the cause of diseases classified elsewhere 12/09/2016  . Pyelonephritis 12/09/2016  . Chronic diastolic CHF (congestive heart failure) ([place]) 12/02/2016  . Protein calorie malnutrition ([place]) 12/02/2016  . Macular degeneration 09/06/2016  . Depression 04/23/2016  . Poor appetite 10/11/2015  . Loss of weight 10/11/2015  . Diarrhea 09/10/2015  . Anxiety 07/04/2015  . Mood disorder ([place]) 07/04/2015  . DVT of lower extremity (deep venous thrombosis) ([place]) 01/23/2015  . OA (osteoarthritis) of knee 09/20/2014  . CKD (chronic kidney disease) stage 3, GFR 30-59 ml/min 08/04/2014  . Anemia of chronic disease 08/04/2014  . Hyperlipidemia   . Essential hypertension 04/20/2014  . Vascular dementia with behavior disturbance 04/20/2014  . Paroxysmal atrial fibrillation ([place]) 04/20/2014  . Basal ganglia infarction ([place]) 04/17/2014  . History  of breast cancer 08/23/2012    CMP     Component Value Date/Time   NA 143 01/12/2017   K 4.4 01/12/2017   CL 105 12/06/2016 0346   CO2 20 (L) 12/06/2016 0346   GLUCOSE 85 12/06/2016 0346   BUN 22 (A) 01/12/2017   CREATININE 1.1 01/12/2017   CREATININE 1.43 (H) 12/06/2016 0346   CALCIUM 7.6 (L) 12/06/2016 0346   PROT 6.3 (L) 12/02/2016 0556   ALBUMIN 2.3 (L) 12/02/2016 0556   AST 15 01/12/2017   ALT 10 01/12/2017   ALKPHOS 66 01/12/2017   BILITOT 0.9 12/02/2016 0556   GFRNONAA 31 (L) 12/06/2016 0346   GFRAA 36 (L) 12/06/2016 0346    Recent Labs  12/03/16 0103  12/04/16 0314  12/06/16 0346 12/12/16 01/12/17  NA 136  < > 135  < > 134* 144 143  K 3.9  < > 4.1  --  3.7 3.2* 4.4  CL 107  --  106  --  105  --   --   CO2 18*  --  19*  --  20*  --   --   GLUCOSE 131*  --  124*  --  85  --   --   BUN 33*  < > 27*  < > 23* 13 22*  CREATININE 2.28*  < > 1.93*  < > 1.43* 0.7 1.1  CALCIUM 7.2*  --  7.6*  --  7.6*  --   --   < > = values in this interval not displayed.  Recent Labs  08/12/16 12/02/16 0556 01/12/17  AST 14 16 15   ALT 10 13* 10  ALKPHOS 64 74 66  BILITOT  --  0.9  --   PROT  --  6.3*  --   ALBUMIN  --  2.3*  --     Recent Labs  12/02/16 0556  12/03/16 0103  12/04/16 0314 12/06/16 12/06/16 0346 01/12/17  WBC 21.5*  < > 13.1*  < > 10.8* 7.2 7.2 7.3  NEUTROABS 17.8*  --   --   --   --   --   --   --   HGB 10.5*  < > 9.7*  < > 10.5*  --  10.1* 10.0*  HCT 34.1*  < > 32.3*  < > 34.3*  --  32.7* 31*  MCV 89.5  --  90.0  --  88.6  --  88.9  --   PLT 341  < > 258  < > 230  --  192 220  < > = values in this interval not displayed.  Recent Labs  08/12/16 01/12/17  CHOL 89 97  LDLCALC 49 57  TRIG 108 102   No results found for: [facility] Lab Results  Component Value Date   TSH 4.80 01/12/2017   Lab Results  Component Value Date   HGBA1C 5.5 01/12/2017   Lab Results  Component Value Date   CHOL 97 01/12/2017   HDL 28 (A) 01/12/2017   LDLCALC 57  01/12/2017   TRIG 102 01/12/2017   CHOLHDL 5.7 04/18/2014    Significant Diagnostic Results in last 30 days:  No results found.  Assessment and Plan  Chronic diastolic CHF (congestive heart failure) (HCC) Recent problems or exacerbations; plan to continue metoprolol 12.5 mg 3 times a day; patient has not required a diuretic  Paroxysmal atrial fibrillation (HCC) Stable; prophylaxed with Eliquis 5 mg by mouth twice a day and rate control with metoprolol 12.5 mg 3 times a day  OA (osteoarthritis) of knee No reports of discomfort or pain; patient has Tylenol and Ultram as needed  Webb Silversmith D. Sheppard Coil, MD

## 2017-03-25 DIAGNOSIS — F419 Anxiety disorder, unspecified: Secondary | ICD-10-CM | POA: Diagnosis not present

## 2017-03-25 DIAGNOSIS — F0390 Unspecified dementia without behavioral disturbance: Secondary | ICD-10-CM | POA: Diagnosis not present

## 2017-03-25 DIAGNOSIS — F39 Unspecified mood [affective] disorder: Secondary | ICD-10-CM | POA: Diagnosis not present

## 2017-03-29 ENCOUNTER — Encounter: Payer: Self-pay | Admitting: Internal Medicine

## 2017-03-29 NOTE — Assessment & Plan Note (Signed)
Stable; prophylaxed with Eliquis 5 mg by mouth twice a day and rate control with metoprolol 12.5 mg 3 times a day

## 2017-03-29 NOTE — Assessment & Plan Note (Signed)
No reports of discomfort or pain; patient has Tylenol and Ultram as needed

## 2017-03-29 NOTE — Assessment & Plan Note (Signed)
Recent problems or exacerbations; plan to continue metoprolol 12.5 mg 3 times a day; patient has not required a diuretic

## 2017-04-03 ENCOUNTER — Non-Acute Institutional Stay (SKILLED_NURSING_FACILITY): Payer: Medicare Other | Admitting: Internal Medicine

## 2017-04-03 ENCOUNTER — Encounter: Payer: Self-pay | Admitting: Internal Medicine

## 2017-04-03 DIAGNOSIS — F32A Depression, unspecified: Secondary | ICD-10-CM

## 2017-04-03 DIAGNOSIS — N183 Chronic kidney disease, stage 3 unspecified: Secondary | ICD-10-CM

## 2017-04-03 DIAGNOSIS — I10 Essential (primary) hypertension: Secondary | ICD-10-CM

## 2017-04-03 DIAGNOSIS — F329 Major depressive disorder, single episode, unspecified: Secondary | ICD-10-CM | POA: Diagnosis not present

## 2017-04-03 NOTE — Progress Notes (Signed)
Location:  [place] Room Number: [place]:  SNF (31)  Cindy Duos, MD  Patient Care Team: Cindy Duos, MD as PCP - General (Internal Medicine)  Extended Emergency Contact Information Primary Emergency Contact: [facility] Address: [place], [place] [address] Cindy Trevino of [place] Phone: [phone] Relation: Grandson    Allergies: Patient has no known allergies.  Chief Complaint  Patient presents with  . Acute Visit    abnormal chest x-ray    HPI: Patient is 83 y.o. female who   Past Medical History:  Diagnosis Date  . Anemia of chronic disease 08/04/2014  . Anxiety 07/04/2015  . Arthritis   . Basal ganglia infarction ([place]) 04/17/2014  . Chronic diastolic CHF (congestive heart failure) ([place]) 12/02/2016  . CKD (chronic kidney disease) stage 3, GFR 30-59 ml/min ([place]) 08/04/2014  . COPD (chronic obstructive pulmonary disease) ([place])   . Dementia 02/02/2012  . Depression 04/23/2016  . DVT of lower extremity (deep venous thrombosis) ([place]) 01/23/2015  . Essential hypertension 04/20/2014  . History of breast cancer 2009   Left breast  s/p lumpectomy and rads. declined hormonal tx. last mammogram 2011  . Hyperlipidemia   . Hypertension   . OA (osteoarthritis) of knee 09/20/2014  . PAF (paroxysmal atrial fibrillation) ([place])   . Paroxysmal atrial fibrillation ([place]) 04/20/2014  . Pyelonephritis 12/09/2016  . SVT (supraventricular tachycardia) ([place])   . Vascular dementia with behavior disturbance 04/20/2014    Past Surgical History:  Procedure Laterality Date  . BREAST LUMPECTOMY Left 2009   left breast  . CESAREAN SECTION    . EYE SURGERY     cataracts x3    Allergies as of 04/03/2017   No Known Allergies     Medication List       Accurate as of 04/03/17  2:38 PM. Always use your most recent med list.          atorvastatin 20 MG tablet Commonly known as:  LIPITOR Take 20 mg by mouth. Take  one tablet daily   bisacodyl 10 MG suppository Commonly known as:  DULCOLAX Place 10 mg rectally as needed for moderate constipation.   ELIQUIS 2.5 MG Tabs tablet Generic drug:  apixaban Take 2.5 mg by mouth 2 (two) times daily.   feeding supplement (ENSURE) Pudg Take 1 Container by mouth 2 (two) times daily between meals.   ferrous sulfate 325 (65 FE) MG tablet Take 325 mg by mouth. Take one tablet daily for anemia   LORazepam 0.5 MG tablet Commonly known as:  ATIVAN Take 0.5 mg by mouth. Take one tablet daily at lunch for anxiety   memantine 10 MG tablet Commonly known as:  NAMENDA Take 10 mg by mouth 2 (two) times daily.   metoprolol tartrate 25 MG tablet Commonly known as:  LOPRESSOR Take 0.5 tablets (12.5 mg total) by mouth 3 (three) times daily.   PRESERVISION AREDS 2 PO Take 1 tablet by mouth 2 (two) times daily.   Propylene Glycol 0.6 % Soln Place 1 drop into both eyes 2 (two) times daily.   RA SALINE ENEMA 19-7 GM/118ML Enem Place 1 each rectally as needed (for constipation).   senna 8.6 MG Tabs tablet Commonly known as:  SENOKOT Take 1 tablet by mouth daily.       No orders of the defined types were placed in this encounter.   Immunization History  Administered Date(s) Administered  .  Influenza Inj Mdck Quad Pf 03/09/2012  . Influenza Split 04/20/2009, 05/12/2010  . Influenza, Seasonal, Injecte, Preservative Fre 03/21/2013  . Influenza-Unspecified 03/22/2015  . PPD Test 05/08/2014  . Pneumococcal Polysaccharide-23 05/12/2010  . Td 01/14/2008  . Zoster 01/14/2008    Social History  Substance Use Topics  . Smoking status: Never Smoker  . Smokeless tobacco: Never Used  . Alcohol use No    Review of Systems  DATA OBTAINED: from patient, nurse, medical record, family member GENERAL:  no fevers, fatigue, appetite changes SKIN: No itching, rash HEENT: No complaint RESPIRATORY: No cough, wheezing, SOB CARDIAC: No chest pain, palpitations,  lower extremity edema  GI: No abdominal pain, No N/V/D or constipation, No heartburn or reflux  GU: No dysuria, frequency or urgency, or incontinence  MUSCULOSKELETAL: No unrelieved bone/joint pain NEUROLOGIC: No headache, dizziness  PSYCHIATRIC: No overt anxiety or sadness  Vitals:   04/03/17 1430  BP: 139/78  Pulse: 66  Resp: 18  Temp: (!) 97.2 F (36.2 C)   Body mass index is 29.9 kg/m. Physical Exam  GENERAL APPEARANCE: Alert, conversant, No acute distress  SKIN: No diaphoresis rash HEENT: Unremarkable RESPIRATORY: Breathing is even, unlabored. Lung sounds are clear   CARDIOVASCULAR: Heart RRR no murmurs, rubs or gallops. No peripheral edema  GASTROINTESTINAL: Abdomen is soft, non-tender, not distended w/ normal bowel sounds.  GENITOURINARY: Bladder non tender, not distended  MUSCULOSKELETAL: No abnormal joints or musculature NEUROLOGIC: Cranial nerves 2-12 grossly intact. Moves all extremities PSYCHIATRIC: Mood and affect appropriate to situation, no behavioral issues  Patient Active Problem List   Diagnosis Date Noted  . Klebsiella pneumoniae (k. pneumoniae) as the cause of diseases classified elsewhere 12/09/2016  . Pyelonephritis 12/09/2016  . Chronic diastolic CHF (congestive heart failure) ([place]) 12/02/2016  . Protein calorie malnutrition ([place]) 12/02/2016  . Macular degeneration 09/06/2016  . Depression 04/23/2016  . Poor appetite 10/11/2015  . Loss of weight 10/11/2015  . Diarrhea 09/10/2015  . Anxiety 07/04/2015  . Mood disorder ([place]) 07/04/2015  . DVT of lower extremity (deep venous thrombosis) ([place]) 01/23/2015  . OA (osteoarthritis) of knee 09/20/2014  . CKD (chronic kidney disease) stage 3, GFR 30-59 ml/min (HCC) 08/04/2014  . Anemia of chronic disease 08/04/2014  . Hyperlipidemia   . Essential hypertension 04/20/2014  . Vascular dementia with behavior disturbance 04/20/2014  . Paroxysmal atrial fibrillation ([place]) 04/20/2014  . Basal ganglia infarction  ([place]) 04/17/2014  . History of breast cancer 08/23/2012    CMP     Component Value Date/Time   NA 143 01/12/2017   K 4.4 01/12/2017   CL 105 12/06/2016 0346   CO2 20 (L) 12/06/2016 0346   GLUCOSE 85 12/06/2016 0346   BUN 22 (A) 01/12/2017   CREATININE 1.1 01/12/2017   CREATININE 1.43 (H) 12/06/2016 0346   CALCIUM 7.6 (L) 12/06/2016 0346   PROT 6.3 (L) 12/02/2016 0556   ALBUMIN 2.3 (L) 12/02/2016 0556   AST 15 01/12/2017   ALT 10 01/12/2017   ALKPHOS 66 01/12/2017   BILITOT 0.9 12/02/2016 0556   GFRNONAA 31 (L) 12/06/2016 0346   GFRAA 36 (L) 12/06/2016 0346    Recent Labs  12/03/16 0103  12/04/16 0314  12/06/16 0346 12/12/16 01/12/17  NA 136  < > 135  < > 134* 144 143  K 3.9  < > 4.1  --  3.7 3.2* 4.4  CL 107  --  106  --  105  --   --   CO2 18*  --  19*  --  20*  --   --   GLUCOSE 131*  --  124*  --  85  --   --   BUN 33*  < > 27*  < > 23* 13 22*  CREATININE 2.28*  < > 1.93*  < > 1.43* 0.7 1.1  CALCIUM 7.2*  --  7.6*  --  7.6*  --   --   < > = values in this interval not displayed.  Recent Labs  08/12/16 12/02/16 0556 01/12/17  AST 14 16 15   ALT 10 13* 10  ALKPHOS 64 74 66  BILITOT  --  0.9  --   PROT  --  6.3*  --   ALBUMIN  --  2.3*  --     Recent Labs  12/02/16 0556  12/03/16 0103  12/04/16 0314 12/06/16 12/06/16 0346 01/12/17  WBC 21.5*  < > 13.1*  < > 10.8* 7.2 7.2 7.3  NEUTROABS 17.8*  --   --   --   --   --   --   --   HGB 10.5*  < > 9.7*  < > 10.5*  --  10.1* 10.0*  HCT 34.1*  < > 32.3*  < > 34.3*  --  32.7* 31*  MCV 89.5  --  90.0  --  88.6  --  88.9  --   PLT 341  < > 258  < > 230  --  192 220  < > = values in this interval not displayed.  Recent Labs  08/12/16 01/12/17  CHOL 89 97  LDLCALC 49 57  TRIG 108 102   No results found for: [facility] Lab Results  Component Value Date   TSH 4.80 01/12/2017   Lab Results  Component Value Date   HGBA1C 5.5 01/12/2017   Lab Results  Component Value Date   CHOL 97 01/12/2017   HDL 28  (A) 01/12/2017   LDLCALC 57 01/12/2017   TRIG 102 01/12/2017   CHOLHDL 5.7 04/18/2014    Significant Diagnostic Results in last 30 days:  No results found.  Assessment and Plan  No problem-specific Assessment & Plan notes found for this encounter.   Labs/tests ordered:    Noah Delaine. Sheppard Coil, MD

## 2017-04-05 ENCOUNTER — Encounter: Payer: Self-pay | Admitting: Internal Medicine

## 2017-04-05 NOTE — Progress Notes (Signed)
Location:   [facility]:   SNF  Sheppard Coil, Noah Delaine, MD  Patient Care Team: Hennie Duos, MD as PCP - General (Internal Medicine)  Extended Emergency Contact Information Primary Emergency Contact: [facility] Address: [place], [place] [address] [name] of [place] Phone: [phone] Relation: Grandson    Allergies: Patient has no known allergies.  Chief Complaint  Patient presents with  . Medical Management of Chronic Issues    HPI: Patient is 83 y.o. female who Is being seen for routine issues of depression, chronic kidney disease 3, and hypertension.  Past Medical History:  Diagnosis Date  . Anemia of chronic disease 08/04/2014  . Anxiety 07/04/2015  . Arthritis   . Basal ganglia infarction ([place]) 04/17/2014  . Chronic diastolic CHF (congestive heart failure) ([place]) 12/02/2016  . CKD (chronic kidney disease) stage 3, GFR 30-59 ml/min ([place]) 08/04/2014  . COPD (chronic obstructive pulmonary disease) ([place])   . Dementia 02/02/2012  . Depression 04/23/2016  . DVT of lower extremity (deep venous thrombosis) ([place]) 01/23/2015  . Essential hypertension 04/20/2014  . History of breast cancer 2009   Left breast  s/p lumpectomy and rads. declined hormonal tx. last mammogram 2011  . Hyperlipidemia   . Hypertension   . OA (osteoarthritis) of knee 09/20/2014  . PAF (paroxysmal atrial fibrillation) ([place])   . Paroxysmal atrial fibrillation ([place]) 04/20/2014  . Pyelonephritis 12/09/2016  . SVT (supraventricular tachycardia) ([place])   . Vascular dementia with behavior disturbance 04/20/2014    Past Surgical History:  Procedure Laterality Date  . BREAST LUMPECTOMY Left 2009   left breast  . CESAREAN SECTION    . EYE SURGERY     cataracts x3    Allergies as of 04/03/2017   No Known Allergies     Medication List       Accurate as of 04/03/17 11:59 PM. Always use your most recent med list.          atorvastatin 20 MG tablet Commonly  known as:  LIPITOR Take 20 mg by mouth. Take one tablet daily   bisacodyl 10 MG suppository Commonly known as:  DULCOLAX Place 10 mg rectally as needed for moderate constipation.   ELIQUIS 2.5 MG Tabs tablet Generic drug:  apixaban Take 2.5 mg by mouth 2 (two) times daily.   feeding supplement (ENSURE) Pudg Take 1 Container by mouth 2 (two) times daily between meals.   ferrous sulfate 325 (65 FE) MG tablet Take 325 mg by mouth. Take one tablet daily for anemia   LORazepam 0.5 MG tablet Commonly known as:  ATIVAN Take 0.5 mg by mouth. Take one tablet daily at lunch for anxiety   memantine 10 MG tablet Commonly known as:  NAMENDA Take 10 mg by mouth 2 (two) times daily.   metoprolol tartrate 25 MG tablet Commonly known as:  LOPRESSOR Take 0.5 tablets (12.5 mg total) by mouth 3 (three) times daily.   PRESERVISION AREDS 2 PO Take 1 tablet by mouth 2 (two) times daily.   Propylene Glycol 0.6 % Soln Place 1 drop into both eyes 2 (two) times daily.   RA SALINE ENEMA 19-7 GM/118ML Enem Place 1 each rectally as needed (for constipation).   senna 8.6 MG Tabs tablet Commonly known as:  SENOKOT Take 1 tablet by mouth daily.       No orders of the defined types were placed in this encounter.   Immunization History  Administered Date(s) Administered  . Influenza Inj Mdck Quad Pf 03/09/2012  . Influenza Split 04/20/2009, 05/12/2010  . Influenza, Seasonal, Injecte, Preservative Fre 03/21/2013  . Influenza-Unspecified 03/22/2015  . PPD Test 05/08/2014  . Pneumococcal Polysaccharide-23 05/12/2010  . Td 01/14/2008  . Zoster 01/14/2008    Social History  Substance Use Topics  . Smoking status: Never Smoker  . Smokeless tobacco: Never Used  . Alcohol use No    Review of Systems  DATA OBTAINED: from patient-very limited, can make some needs known; nursing-no new concerns GENERAL:  no fevers, fatigue, appetite changes SKIN: No itching, rash HEENT: No  complaint RESPIRATORY: No cough, wheezing, SOB CARDIAC: No chest pain, palpitations, lower extremity edema  GI: No abdominal pain, No N/V/D or constipation, No heartburn or reflux  GU: No dysuria, frequency or urgency, or incontinence  MUSCULOSKELETAL: No unrelieved bone/joint pain NEUROLOGIC: No headache, dizziness  PSYCHIATRIC: No overt anxiety or sadness  Vitals:   04/05/17 1527  BP: 139/78  Pulse: 66  Resp: 18  Temp: (!) 97.2 F (36.2 C)   There is no height or weight on file to calculate BMI. Physical Exam  GENERAL APPEARANCE: Alert, conversant, No acute distress  SKIN: No diaphoresis rash HEENT: Unremarkable RESPIRATORY: Breathing is even, unlabored. Lung sounds are clear   CARDIOVASCULAR: Heart RRR no murmurs, rubs or gallops. No peripheral edema  GASTROINTESTINAL: Abdomen is soft, non-tender, not distended w/ normal bowel sounds.  GENITOURINARY: Bladder non tender, not distended  MUSCULOSKELETAL: No abnormal joints or musculature NEUROLOGIC: Cranial nerves 2-12 grossly intact. Moves all extremities PSYCHIATRIC: Dementia, no behavioral issues  Physical exam has not changed since prior visit.  Patient Active Problem List   Diagnosis Date Noted  . Klebsiella pneumoniae (k. pneumoniae) as the cause of diseases classified elsewhere 12/09/2016  . Pyelonephritis 12/09/2016  . Chronic diastolic CHF (congestive heart failure) ([place]) 12/02/2016  . Protein calorie malnutrition ([place]) 12/02/2016  . Macular degeneration 09/06/2016  . Depression 04/23/2016  . Poor appetite 10/11/2015  . Loss of weight 10/11/2015  . Diarrhea 09/10/2015  . Anxiety 07/04/2015  . Mood disorder ([place]) 07/04/2015  . DVT of lower extremity (deep venous thrombosis) ([place]) 01/23/2015  . OA (osteoarthritis) of knee 09/20/2014  . CKD (chronic kidney disease) stage 3, GFR 30-59 ml/min (HCC) 08/04/2014  . Anemia of chronic disease 08/04/2014  . Hyperlipidemia   . Essential hypertension 04/20/2014  .  Vascular dementia with behavior disturbance 04/20/2014  . Paroxysmal atrial fibrillation ([place]) 04/20/2014  . Basal ganglia infarction ([place]) 04/17/2014  . History of breast cancer 08/23/2012    CMP     Component Value Date/Time   NA 143 01/12/2017   K 4.4 01/12/2017   CL 105 12/06/2016 0346   CO2 20 (L) 12/06/2016 0346   GLUCOSE 85 12/06/2016 0346   BUN 22 (A) 01/12/2017   CREATININE 1.1 01/12/2017   CREATININE 1.43 (H) 12/06/2016 0346   CALCIUM 7.6 (L) 12/06/2016 0346   PROT 6.3 (L) 12/02/2016 0556   ALBUMIN 2.3 (L) 12/02/2016 0556   AST 15 01/12/2017   ALT 10 01/12/2017   ALKPHOS 66 01/12/2017   BILITOT 0.9 12/02/2016 0556   GFRNONAA 31 (L) 12/06/2016 0346   GFRAA 36 (L) 12/06/2016 0346    Recent Labs  12/03/16 0103  12/04/16 0314  12/06/16 0346 12/12/16 01/12/17  NA 136  < > 135  < > 134* 144 143  K 3.9  < > 4.1  --  3.7 3.2* 4.4  CL 107  --  106  --  105  --   --   CO2 18*  --  19*  --  20*  --   --   GLUCOSE 131*  --  124*  --  85  --   --   BUN 33*  < > 27*  < > 23* 13 22*  CREATININE 2.28*  < > 1.93*  < > 1.43* 0.7 1.1  CALCIUM 7.2*  --  7.6*  --  7.6*  --   --   < > = values in this interval not displayed.  Recent Labs  08/12/16 12/02/16 0556 01/12/17  AST 14 16 15   ALT 10 13* 10  ALKPHOS 64 74 66  BILITOT  --  0.9  --   PROT  --  6.3*  --   ALBUMIN  --  2.3*  --     Recent Labs  12/02/16 0556  12/03/16 0103  12/04/16 0314 12/06/16 12/06/16 0346 01/12/17  WBC 21.5*  < > 13.1*  < > 10.8* 7.2 7.2 7.3  NEUTROABS 17.8*  --   --   --   --   --   --   --   HGB 10.5*  < > 9.7*  < > 10.5*  --  10.1* 10.0*  HCT 34.1*  < > 32.3*  < > 34.3*  --  32.7* 31*  MCV 89.5  --  90.0  --  88.6  --  88.9  --   PLT 341  < > 258  < > 230  --  192 220  < > = values in this interval not displayed.  Recent Labs  08/12/16 01/12/17  CHOL 89 97  LDLCALC 49 57  TRIG 108 102   No results found for: [facility] Lab Results  Component Value Date   TSH 4.80 01/12/2017    Lab Results  Component Value Date   HGBA1C 5.5 01/12/2017   Lab Results  Component Value Date   CHOL 97 01/12/2017   HDL 28 (A) 01/12/2017   LDLCALC 57 01/12/2017   TRIG 102 01/12/2017   CHOLHDL 5.7 04/18/2014    Significant Diagnostic Results in last 30 days:  No results found.  Assessment and Plan  Depression Mood is stable, patient seen daily at nursing station; will continue to monitor patient while off Remeron  CKD (chronic kidney disease) stage 3, GFR 30-59 ml/min GFR, most recent reticulocyte creatinine is 1.1 which is stable; will continue to monitor intervals  Essential hypertension Continue stable; continue metoprolol 12.5 mg by mouth 3 times a day    [name], MD

## 2017-04-05 NOTE — Progress Notes (Signed)
This encounter was created in error - please disregard.

## 2017-04-05 NOTE — Assessment & Plan Note (Signed)
GFR, most recent reticulocyte creatinine is 1.1 which is stable; will continue to monitor intervals

## 2017-04-05 NOTE — Assessment & Plan Note (Signed)
Continue stable; continue metoprolol 12.5 mg by mouth 3 times a day

## 2017-04-05 NOTE — Assessment & Plan Note (Signed)
Mood is stable, patient seen daily at nursing station; will continue to monitor patient while off Remeron

## 2017-04-06 DIAGNOSIS — D519 Vitamin B12 deficiency anemia, unspecified: Secondary | ICD-10-CM | POA: Diagnosis not present

## 2017-04-06 DIAGNOSIS — E119 Type 2 diabetes mellitus without complications: Secondary | ICD-10-CM | POA: Diagnosis not present

## 2017-04-06 DIAGNOSIS — I10 Essential (primary) hypertension: Secondary | ICD-10-CM | POA: Diagnosis not present

## 2017-04-06 DIAGNOSIS — D509 Iron deficiency anemia, unspecified: Secondary | ICD-10-CM | POA: Diagnosis not present

## 2017-04-06 DIAGNOSIS — E785 Hyperlipidemia, unspecified: Secondary | ICD-10-CM | POA: Diagnosis not present

## 2017-04-06 DIAGNOSIS — D649 Anemia, unspecified: Secondary | ICD-10-CM | POA: Diagnosis not present

## 2017-04-20 DIAGNOSIS — F419 Anxiety disorder, unspecified: Secondary | ICD-10-CM | POA: Diagnosis not present

## 2017-04-20 DIAGNOSIS — F0390 Unspecified dementia without behavioral disturbance: Secondary | ICD-10-CM | POA: Diagnosis not present

## 2017-04-20 DIAGNOSIS — F39 Unspecified mood [affective] disorder: Secondary | ICD-10-CM | POA: Diagnosis not present

## 2017-05-07 DIAGNOSIS — F0390 Unspecified dementia without behavioral disturbance: Secondary | ICD-10-CM | POA: Diagnosis not present

## 2017-05-07 DIAGNOSIS — F39 Unspecified mood [affective] disorder: Secondary | ICD-10-CM | POA: Diagnosis not present

## 2017-05-07 DIAGNOSIS — F419 Anxiety disorder, unspecified: Secondary | ICD-10-CM | POA: Diagnosis not present

## 2017-05-11 ENCOUNTER — Non-Acute Institutional Stay (SKILLED_NURSING_FACILITY): Payer: Medicare Other | Admitting: Internal Medicine

## 2017-05-11 ENCOUNTER — Encounter: Payer: Self-pay | Admitting: Internal Medicine

## 2017-05-11 DIAGNOSIS — F0151 Vascular dementia with behavioral disturbance: Secondary | ICD-10-CM | POA: Diagnosis not present

## 2017-05-11 DIAGNOSIS — F419 Anxiety disorder, unspecified: Secondary | ICD-10-CM

## 2017-05-11 DIAGNOSIS — E7849 Other hyperlipidemia: Secondary | ICD-10-CM | POA: Diagnosis not present

## 2017-05-11 DIAGNOSIS — F01518 Vascular dementia, unspecified severity, with other behavioral disturbance: Secondary | ICD-10-CM

## 2017-05-11 NOTE — Progress Notes (Signed)
Location:  [place] Room Number: 203D Place of Service:  SNF (31)  Cindy Duos, MD  Patient Care Team: Cindy Duos, MD as PCP - General (Internal Medicine)  Extended Emergency Contact Information Primary Emergency Contact: [facility] Address: [place], [place] [address] [name] Phone: [phone] Relation: Grandson    Allergies: Patient has no known allergies.  Chief Complaint  Patient presents with  . Medical Management of Chronic Issues    routine    HPI: Patient is 83 y.o. female who is being seen for routine issues of dementia, anxiety, and hyperlipidemia.  Past Medical History:  Diagnosis Date  . Anemia of chronic disease 08/04/2014  . Anxiety 07/04/2015  . Arthritis   . Basal ganglia infarction ([place]) 04/17/2014  . Chronic diastolic CHF (congestive heart failure) ([place]) 12/02/2016  . CKD (chronic kidney disease) stage 3, GFR 30-59 ml/min ([place]) 08/04/2014  . COPD (chronic obstructive pulmonary disease) ([place])   . Dementia 02/02/2012  . Depression 04/23/2016  . DVT of lower extremity (deep venous thrombosis) ([place]) 01/23/2015  . Essential hypertension 04/20/2014  . History of breast cancer 2009   Left breast  s/p lumpectomy and rads. declined hormonal tx. last mammogram 2011  . Hyperlipidemia   . Hypertension   . OA (osteoarthritis) of knee 09/20/2014  . PAF (paroxysmal atrial fibrillation) ([place])   . Paroxysmal atrial fibrillation ([place]) 04/20/2014  . Pyelonephritis 12/09/2016  . SVT (supraventricular tachycardia) ([place])   . Vascular dementia with behavior disturbance 04/20/2014    Past Surgical History:  Procedure Laterality Date  . BREAST LUMPECTOMY Left 2009   left breast  . CESAREAN SECTION    . EYE SURGERY     cataracts x3    Allergies as of 05/11/2017   No Known Allergies     Medication List        Accurate as of 05/11/17 11:59 PM. Always use your most recent med list.          atorvastatin 20 MG tablet Commonly known as:  LIPITOR Take 20 mg by mouth. Take one tablet daily   bisacodyl 10 MG suppository Commonly known as:  DULCOLAX Place 10 mg rectally as needed for moderate constipation.   ELIQUIS 2.5 MG Tabs tablet Generic drug:  apixaban Take 2.5 mg by mouth 2 (two) times daily.   feeding supplement (ENSURE) Pudg Take 1 Container by mouth 2 (two) times daily between meals.   ferrous sulfate 325 (65 FE) MG tablet Take 325 mg by mouth. Take one tablet daily for anemia   LORazepam 0.5 MG tablet Commonly known as:  ATIVAN Take 0.5 mg by mouth. Take one tablet daily at lunch for anxiety   memantine 10 MG tablet Commonly known as:  NAMENDA Take 10 mg by mouth 2 (two) times daily.   metoprolol tartrate 25 MG tablet Commonly known as:  LOPRESSOR Take 0.5 tablets (12.5 mg total) by mouth 3 (three) times daily.   Propylene Glycol 0.6 % Soln Place 1 drop into both eyes 2 (two) times daily.   RA SALINE ENEMA 19-7 GM/118ML Enem Place 1 each rectally as needed (for constipation).   senna 8.6 MG Tabs tablet Commonly known as:  SENOKOT Take 1 tablet by mouth daily.       No orders of the defined types were placed in this encounter.   Immunization History  Administered Date(s) Administered  . Influenza Inj Mdck  Quad Pf 03/09/2012  . Influenza Split 04/20/2009, 05/12/2010  . Influenza, Seasonal, Injecte, Preservative Fre 03/21/2013  . Influenza-Unspecified 03/22/2015  . PPD Test 05/08/2014  . Pneumococcal Polysaccharide-23 05/12/2010  . Td 01/14/2008  . Zoster 01/14/2008    Social History   Tobacco Use  . Smoking status: Never Smoker  . Smokeless tobacco: Never Used  Substance Use Topics  . Alcohol use: No    Review of Systems  DATA OBTAINED: from patient-extremely limited; nursing-no concerns GENERAL:  no fevers, fatigue, appetite changes SKIN: No itching, rash HEENT: No complaint RESPIRATORY: No cough, wheezing,  SOB CARDIAC: No chest pain, palpitations, lower extremity edema  GI: No abdominal pain, No N/V/D or constipation, No heartburn or reflux  GU: No dysuria, frequency or urgency, or incontinence  MUSCULOSKELETAL: No unrelieved bone/joint pain NEUROLOGIC: No headache, dizziness  PSYCHIATRIC: No overt anxiety or sadness  Vitals:   05/11/17 1015  BP: 120/67  Pulse: 72  Resp: 18  Temp: 98.2 F (36.8 C)  SpO2: 97%   Body mass index is 31.35 kg/m. Physical Exam  GENERAL APPEARANCE: Alert, conversant, No acute distress  SKIN: No diaphoresis rash HEENT: Unremarkable RESPIRATORY: Breathing is even, unlabored. Lung sounds are clear   CARDIOVASCULAR: Heart RRR no murmurs, rubs or gallops. Trace peripheral edema  GASTROINTESTINAL: Abdomen is soft, non-tender, not distended w/ normal bowel sounds.  GENITOURINARY: Bladder non tender, not distended  MUSCULOSKELETAL: No abnormal joints or musculature NEUROLOGIC: Cranial nerves 2-12 grossly intact. Moves all extremities PSYCHIATRIC: Mood and affect with dementia, no behavioral issues  Patient Active Problem List   Diagnosis Date Noted  . Klebsiella pneumoniae (k. pneumoniae) as the cause of diseases classified elsewhere 12/09/2016  . Pyelonephritis 12/09/2016  . Chronic diastolic CHF (congestive heart failure) ([place]) 12/02/2016  . Protein calorie malnutrition ([place]) 12/02/2016  . Macular degeneration 09/06/2016  . Depression 04/23/2016  . Poor appetite 10/11/2015  . Loss of weight 10/11/2015  . Diarrhea 09/10/2015  . Anxiety 07/04/2015  . Mood disorder ([place]) 07/04/2015  . DVT of lower extremity (deep venous thrombosis) ([place]) 01/23/2015  . OA (osteoarthritis) of knee 09/20/2014  . CKD (chronic kidney disease) stage 3, GFR 30-59 ml/min ([facility]) 08/04/2014  . Anemia of chronic disease 08/04/2014  . Hyperlipidemia   . Essential hypertension 04/20/2014  . Vascular dementia with behavior disturbance 04/20/2014  . Paroxysmal atrial fibrillation  ([place]) 04/20/2014  . Basal ganglia infarction ([place]) 04/17/2014  . History of breast cancer 08/23/2012    CMP     Component Value Date/Time   NA 143 01/12/2017   K 4.4 01/12/2017   CL 105 12/06/2016 0346   CO2 20 (L) 12/06/2016 0346   GLUCOSE 85 12/06/2016 0346   BUN 22 (A) 01/12/2017   CREATININE 1.1 01/12/2017   CREATININE 1.43 (H) 12/06/2016 0346   CALCIUM 7.6 (L) 12/06/2016 0346   PROT 6.3 (L) 12/02/2016 0556   ALBUMIN 2.3 (L) 12/02/2016 0556   AST 15 01/12/2017   ALT 10 01/12/2017   ALKPHOS 66 01/12/2017   BILITOT 0.9 12/02/2016 0556   GFRNONAA 31 (L) 12/06/2016 0346   GFRAA 36 (L) 12/06/2016 0346   Recent Labs    12/03/16 0103  12/04/16 0314  12/06/16 0346 12/12/16 01/12/17  NA 136   < > 135   < > 134* 144 143  K 3.9   < > 4.1  --  3.7 3.2* 4.4  CL 107  --  106  --  105  --   --   CO2  18*  --  19*  --  20*  --   --   GLUCOSE 131*  --  124*  --  85  --   --   BUN 33*   < > 27*   < > 23* 13 22*  CREATININE 2.28*   < > 1.93*   < > 1.43* 0.7 1.1  CALCIUM 7.2*  --  7.6*  --  7.6*  --   --    < > = values in this interval not displayed.   Recent Labs    08/12/16 12/02/16 0556 01/12/17  AST 14 16 15   ALT 10 13* 10  ALKPHOS 64 74 66  BILITOT  --  0.9  --   PROT  --  6.3*  --   ALBUMIN  --  2.3*  --    Recent Labs    12/02/16 0556  12/03/16 0103  12/04/16 0314 12/06/16 12/06/16 0346 01/12/17  WBC 21.5*   < > 13.1*   < > 10.8* 7.2 7.2 7.3  NEUTROABS 17.8*  --   --   --   --   --   --   --   HGB 10.5*   < > 9.7*   < > 10.5*  --  10.1* 10.0*  HCT 34.1*   < > 32.3*   < > 34.3*  --  32.7* 31*  MCV 89.5  --  90.0  --  88.6  --  88.9  --   PLT 341   < > 258   < > 230  --  192 220   < > = values in this interval not displayed.   Recent Labs    08/12/16 01/12/17  CHOL 89 97  LDLCALC 49 57  TRIG 108 102   No results found for: [facility] Lab Results  Component Value Date   TSH 4.80 01/12/2017   Lab Results  Component Value Date   HGBA1C 5.5 01/12/2017    Lab Results  Component Value Date   CHOL 97 01/12/2017   HDL 28 (A) 01/12/2017   LDLCALC 57 01/12/2017   TRIG 102 01/12/2017   CHOLHDL 5.7 04/18/2014    Significant Diagnostic Results in last 30 days:  No results found.  Assessment and Plan  Vascular dementia with behavior disturbance Chronic, with no major declines; plan to continue Namenda 10 mg by mouth twice a day  Anxiety Patient has not been doing well without her midday Ativan; have restarted Ativan 0.5 mg by mouth daily at lunch  Hyperlipidemia Stable; plan to continue Lipitor 20 mg by mouth daily    Cindy Trevino D. Sheppard Coil, MD

## 2017-05-20 ENCOUNTER — Non-Acute Institutional Stay (SKILLED_NURSING_FACILITY): Payer: Medicare Other | Admitting: Internal Medicine

## 2017-05-20 ENCOUNTER — Encounter: Payer: Self-pay | Admitting: Internal Medicine

## 2017-05-20 DIAGNOSIS — H10502 Unspecified blepharoconjunctivitis, left eye: Secondary | ICD-10-CM

## 2017-05-20 DIAGNOSIS — L03213 Periorbital cellulitis: Secondary | ICD-10-CM

## 2017-05-20 NOTE — Progress Notes (Signed)
Location:  [place] Room Number: [place]:  SNF (31)  Cindy Duos, MD  Patient Care Team: Cindy Duos, MD as PCP - General (Internal Medicine)  Extended Emergency Contact Information Primary Emergency Contact: Cindy Trevino Address: [name], [place] [address] Cindy Trevino of [place] Phone: [phone] Relation: Grandson    Allergies: Patient has no known allergies.  Chief Complaint  Patient presents with  . Acute Visit    pink eye    HPI: Patient is 83 y.o. female who nursing asked me to see because her left eye is swollen and inflamed. Unknown how long the situation is been going on and patient with dementia and cannot give history. Patient is at nursing station daily and has not had cough or cold symptoms. Per nursing no fever.   Past Medical History:  Diagnosis Date  . Anemia of chronic disease 08/04/2014  . Anxiety 07/04/2015  . Arthritis   . Basal ganglia infarction ([place]) 04/17/2014  . Chronic diastolic CHF (congestive heart failure) ([place]) 12/02/2016  . CKD (chronic kidney disease) stage 3, GFR 30-59 ml/min ([place]) 08/04/2014  . COPD (chronic obstructive pulmonary disease) ([place])   . Dementia 02/02/2012  . Depression 04/23/2016  . DVT of lower extremity (deep venous thrombosis) ([place]) 01/23/2015  . Essential hypertension 04/20/2014  . History of breast cancer 2009   Left breast  s/p lumpectomy and rads. declined hormonal tx. last mammogram 2011  . Hyperlipidemia   . Hypertension   . OA (osteoarthritis) of knee 09/20/2014  . PAF (paroxysmal atrial fibrillation) ([place])   . Paroxysmal atrial fibrillation ([place]) 04/20/2014  . Pyelonephritis 12/09/2016  . SVT (supraventricular tachycardia) ([place])   . Vascular dementia with behavior disturbance 04/20/2014    Past Surgical History:  Procedure Laterality Date  . BREAST LUMPECTOMY Left 2009   left breast  . CESAREAN SECTION    . EYE SURGERY     cataracts  x3    Allergies as of 05/20/2017   No Known Allergies     Medication List        Accurate as of 05/20/17  3:57 PM. Always use your most recent med list.          atorvastatin 20 MG tablet Commonly known as:  LIPITOR Take 20 mg by mouth. Take one tablet daily   bisacodyl 10 MG suppository Commonly known as:  DULCOLAX Place 10 mg rectally as needed for moderate constipation.   ELIQUIS 2.5 MG Tabs tablet Generic drug:  apixaban Take 2.5 mg by mouth 2 (two) times daily.   feeding supplement (ENSURE) Pudg Take 1 Container by mouth 2 (two) times daily between meals.   ferrous sulfate 325 (65 FE) MG tablet Take 325 mg by mouth. Take one tablet daily for anemia   LORazepam 0.5 MG tablet Commonly known as:  ATIVAN Take 0.5 mg by mouth. Take one tablet daily at lunch for anxiety   memantine 10 MG tablet Commonly known as:  NAMENDA Take 10 mg by mouth 2 (two) times daily.   metoprolol tartrate 25 MG tablet Commonly known as:  LOPRESSOR Take 0.5 tablets (12.5 mg total) by mouth 3 (three) times daily.   Propylene Glycol 0.6 % Soln Place 1 drop into both eyes 2 (two) times daily.   RA SALINE ENEMA 19-7 GM/118ML Enem Place 1 each rectally as needed (for constipation).   senna 8.6 MG Tabs tablet Commonly known as:  SENOKOT Take 1 tablet by mouth daily.       No orders of the defined types were placed in this encounter.   Immunization History  Administered Date(s) Administered  . Influenza Inj Mdck Quad Pf 03/09/2012  . Influenza Split 04/20/2009, 05/12/2010  . Influenza, Seasonal, Injecte, Preservative Fre 03/21/2013  . Influenza-Unspecified 03/22/2015  . PPD Test 05/08/2014  . Pneumococcal Polysaccharide-23 05/12/2010  . Td 01/14/2008  . Zoster 01/14/2008    Social History   Tobacco Use  . Smoking status: Never Smoker  . Smokeless tobacco: Never Used  Substance Use Topics  . Alcohol use: No    Review of Systems  DATA OBTAINED: from  patient-extremely limited; nursing-as per history present illness GENERAL:  no fevers, fatigue, appetite changes SKIN: No itching, rash HEENT: Left eye red and swollen RESPIRATORY: No cough, wheezing, SOB CARDIAC: No chest pain, palpitations, lower extremity edema  GI: No abdominal pain, No N/V/D or constipation, No heartburn or reflux  GU: No dysuria, frequency or urgency, or incontinence  MUSCULOSKELETAL: No unrelieved bone/joint pain NEUROLOGIC: No headache, dizziness  PSYCHIATRIC: No overt anxiety or sadness  Vitals:   05/20/17 1544  BP: 132/65  Pulse: 78  Resp: 20  Temp: 98 F (36.7 C)   Body mass index is 31.35 kg/m. Physical Exam  GENERAL APPEARANCE: Alert, conversant, No acute distress  SKIN: No diaphoresis rash HEENT: Left conjunctiva injected, moderate discharge; upper and lower lids with some erythema and redness RESPIRATORY: Breathing is even, unlabored. Lung sounds are clear   CARDIOVASCULAR: Heart RRR no murmurs, rubs or gallops. Trace peripheral edema  GASTROINTESTINAL: Abdomen is soft, non-tender, not distended w/ normal bowel sounds.  GENITOURINARY: Bladder non tender, not distended  MUSCULOSKELETAL: No abnormal joints or musculature NEUROLOGIC: Cranial nerves 2-12 grossly intact. Moves all extremities PSYCHIATRIC: Dementia, no behavioral issues  Patient Active Problem List   Diagnosis Date Noted  . Klebsiella pneumoniae (k. pneumoniae) as the cause of diseases classified elsewhere 12/09/2016  . Pyelonephritis 12/09/2016  . Chronic diastolic CHF (congestive heart failure) ([place]) 12/02/2016  . Protein calorie malnutrition ([place]) 12/02/2016  . Macular degeneration 09/06/2016  . Depression 04/23/2016  . Poor appetite 10/11/2015  . Loss of weight 10/11/2015  . Diarrhea 09/10/2015  . Anxiety 07/04/2015  . Mood disorder ([place]) 07/04/2015  . DVT of lower extremity (deep venous thrombosis) ([place]) 01/23/2015  . OA (osteoarthritis) of knee 09/20/2014  . CKD  (chronic kidney disease) stage 3, GFR 30-59 ml/min ([facility]) 08/04/2014  . Anemia of chronic disease 08/04/2014  . Hyperlipidemia   . Essential hypertension 04/20/2014  . Vascular dementia with behavior disturbance 04/20/2014  . Paroxysmal atrial fibrillation ([place]) 04/20/2014  . Basal ganglia infarction ([place]) 04/17/2014  . History of breast cancer 08/23/2012    CMP     Component Value Date/Time   NA 143 01/12/2017   K 4.4 01/12/2017   CL 105 12/06/2016 0346   CO2 20 (L) 12/06/2016 0346   GLUCOSE 85 12/06/2016 0346   BUN 22 (A) 01/12/2017   CREATININE 1.1 01/12/2017   CREATININE 1.43 (H) 12/06/2016 0346   CALCIUM 7.6 (L) 12/06/2016 0346   PROT 6.3 (L) 12/02/2016 0556   ALBUMIN 2.3 (L) 12/02/2016 0556   AST 15 01/12/2017   ALT 10 01/12/2017   ALKPHOS 66 01/12/2017   BILITOT 0.9 12/02/2016 0556   GFRNONAA 31 (L) 12/06/2016 0346   GFRAA 36 (L) 12/06/2016 0346   Recent Labs    12/03/16 0103  12/04/16 0314  12/06/16 0346 12/12/16  01/12/17  NA 136   < > 135   < > 134* 144 143  K 3.9   < > 4.1  --  3.7 3.2* 4.4  CL 107  --  106  --  105  --   --   CO2 18*  --  19*  --  20*  --   --   GLUCOSE 131*  --  124*  --  85  --   --   BUN 33*   < > 27*   < > 23* 13 22*  CREATININE 2.28*   < > 1.93*   < > 1.43* 0.7 1.1  CALCIUM 7.2*  --  7.6*  --  7.6*  --   --    < > = values in this interval not displayed.   Recent Labs    08/12/16 12/02/16 0556 01/12/17  AST 14 16 15   ALT 10 13* 10  ALKPHOS 64 74 66  BILITOT  --  0.9  --   PROT  --  6.3*  --   ALBUMIN  --  2.3*  --    Recent Labs    12/02/16 0556  12/03/16 0103  12/04/16 0314 12/06/16 12/06/16 0346 01/12/17  WBC 21.5*   < > 13.1*   < > 10.8* 7.2 7.2 7.3  NEUTROABS 17.8*  --   --   --   --   --   --   --   HGB 10.5*   < > 9.7*   < > 10.5*  --  10.1* 10.0*  HCT 34.1*   < > 32.3*   < > 34.3*  --  32.7* 31*  MCV 89.5  --  90.0  --  88.6  --  88.9  --   PLT 341   < > 258   < > 230  --  192 220   < > = values in this  interval not displayed.   Recent Labs    08/12/16 01/12/17  CHOL 89 97  LDLCALC 49 57  TRIG 108 102   No results found for: [facility] Lab Results  Component Value Date   TSH 4.80 01/12/2017   Lab Results  Component Value Date   HGBA1C 5.5 01/12/2017   Lab Results  Component Value Date   CHOL 97 01/12/2017   HDL 28 (A) 01/12/2017   LDLCALC 57 01/12/2017   TRIG 102 01/12/2017   CHOLHDL 5.7 04/18/2014    Significant Diagnostic Results in last 30 days:  No results found.  Assessment and Plan  LEFT CONJUNCTIVITIS/cellulitis eyelids- have written for Garamycin 2 drops 4 times a day 7 days and doxycycline 100 mg twice a day for 5 days for lid cellulitis    Noah Delaine. Sheppard Coil, MD

## 2017-05-22 ENCOUNTER — Encounter: Payer: Self-pay | Admitting: Internal Medicine

## 2017-05-22 NOTE — Assessment & Plan Note (Signed)
Patient has not been doing well without her midday Ativan; have restarted Ativan 0.5 mg by mouth daily at lunch

## 2017-05-22 NOTE — Assessment & Plan Note (Signed)
Chronic, with no major declines; plan to continue Namenda 10 mg by mouth twice a day

## 2017-05-22 NOTE — Assessment & Plan Note (Signed)
Stable; plan to continue Lipitor 20 mg by mouth daily

## 2017-05-26 DIAGNOSIS — E785 Hyperlipidemia, unspecified: Secondary | ICD-10-CM | POA: Diagnosis not present

## 2017-05-26 DIAGNOSIS — E039 Hypothyroidism, unspecified: Secondary | ICD-10-CM | POA: Diagnosis not present

## 2017-05-26 LAB — LIPID PANEL
CHOLESTEROL: 97 (ref 0–200)
HDL: 32 — AB (ref 35–70)
LDL CALC: 50
TRIGLYCERIDES: 73 (ref 40–160)

## 2017-05-29 ENCOUNTER — Other Ambulatory Visit: Payer: Self-pay

## 2017-06-03 ENCOUNTER — Non-Acute Institutional Stay (SKILLED_NURSING_FACILITY): Payer: Medicare Other | Admitting: Internal Medicine

## 2017-06-03 ENCOUNTER — Encounter: Payer: Self-pay | Admitting: Internal Medicine

## 2017-06-03 DIAGNOSIS — M1712 Unilateral primary osteoarthritis, left knee: Secondary | ICD-10-CM | POA: Diagnosis not present

## 2017-06-03 DIAGNOSIS — I480 Paroxysmal atrial fibrillation: Secondary | ICD-10-CM | POA: Diagnosis not present

## 2017-06-03 DIAGNOSIS — I5032 Chronic diastolic (congestive) heart failure: Secondary | ICD-10-CM

## 2017-06-03 NOTE — Progress Notes (Signed)
Location:  [place] Room Number: 203D Place of Service:  SNF (31)  Hennie Duos, MD  Patient Care Team: Hennie Duos, MD as PCP - General (Internal Medicine)  Extended Emergency Contact Information Primary Emergency Contact: [facility] Address: [place], [place] [address] [name] of [place] Phone: [phone] Relation: Grandson    Allergies: Patient has no known allergies.  Chief Complaint  Patient presents with  . Medical Management of Chronic Issues    routine visit    HPI: Patient is 83 y.o. female who is being seen for routine issues of chronic diastolic congestive heart failure, atrial fibrillation, and osteoarthritis.  Past Medical History:  Diagnosis Date  . Anemia of chronic disease 08/04/2014  . Anxiety 07/04/2015  . Arthritis   . Basal ganglia infarction ([place]) 04/17/2014  . Chronic diastolic CHF (congestive heart failure) ([place]) 12/02/2016  . CKD (chronic kidney disease) stage 3, GFR 30-59 ml/min ([place]) 08/04/2014  . COPD (chronic obstructive pulmonary disease) ([place])   . Dementia 02/02/2012  . Depression 04/23/2016  . DVT of lower extremity (deep venous thrombosis) ([place]) 01/23/2015  . Essential hypertension 04/20/2014  . History of breast cancer 2009   Left breast  s/p lumpectomy and rads. declined hormonal tx. last mammogram 2011  . Hyperlipidemia   . Hypertension   . OA (osteoarthritis) of knee 09/20/2014  . PAF (paroxysmal atrial fibrillation) ([place])   . Paroxysmal atrial fibrillation ([place]) 04/20/2014  . Pyelonephritis 12/09/2016  . SVT (supraventricular tachycardia) ([place])   . Vascular dementia with behavior disturbance 04/20/2014    Past Surgical History:  Procedure Laterality Date  . BREAST LUMPECTOMY Left 2009   left breast  . CESAREAN SECTION    . EYE SURGERY     cataracts x3    Allergies as of 06/03/2017   No Known Allergies     Medication List        Accurate as of 06/03/17  11:59 PM. Always use your most recent med list.          atorvastatin 20 MG tablet Commonly known as:  LIPITOR Take 20 mg by mouth. Take one tablet daily   ELIQUIS 2.5 MG Tabs tablet Generic drug:  apixaban Take 2.5 mg by mouth 2 (two) times daily.   ferrous sulfate 325 (65 FE) MG tablet Take 325 mg by mouth. Take one tablet daily for anemia   LORazepam 0.5 MG tablet Commonly known as:  ATIVAN Take 0.5 mg by mouth. Take one tablet daily at lunch for anxiety   memantine 10 MG tablet Commonly known as:  NAMENDA Take 10 mg by mouth 2 (two) times daily.   metoprolol tartrate 25 MG tablet Commonly known as:  LOPRESSOR Take 0.5 tablets (12.5 mg total) by mouth 3 (three) times daily.   Propylene Glycol 0.6 % Soln Place 1 drop into both eyes 2 (two) times daily.   RA SALINE ENEMA 19-7 GM/118ML Enem Place 1 each rectally as needed (for constipation).   senna 8.6 MG Tabs tablet Commonly known as:  SENOKOT Take 1 tablet by mouth daily.       No orders of the defined types were placed in this encounter.   Immunization History  Administered Date(s) Administered  . Influenza Inj Mdck Quad Pf 03/09/2012  . Influenza Split 04/20/2009, 05/12/2010  . Influenza, Seasonal, Injecte, Preservative Fre 03/21/2013  . Influenza-Unspecified 03/22/2015  . PPD Test 05/08/2014  . Pneumococcal Polysaccharide-23  05/12/2010  . Td 01/14/2008  . Zoster 01/14/2008    Social History   Tobacco Use  . Smoking status: Never Smoker  . Smokeless tobacco: Never Used  Substance Use Topics  . Alcohol use: No    Review of Systems  unable to obtain secondary to dementia; nursing-no new concerns    Vitals:   06/03/17 1544  BP: 134/66  Pulse: 73  Resp: 18  Temp: 98 F (36.7 C)  SpO2: 95%   Body mass index is 31.11 kg/m. Physical Exam  GENERAL APPEARANCE: Alert, minimally conversant, No acute distress  SKIN: No diaphoresis rash HEENT: Unremarkable RESPIRATORY: Breathing is even,  unlabored. Lung sounds are clear   CARDIOVASCULAR: Heart RRR no murmurs, rubs or gallops. No peripheral edema  GASTROINTESTINAL: Abdomen is soft, non-tender, not distended w/ normal bowel sounds.  GENITOURINARY: Bladder non tender, not distended  MUSCULOSKELETAL: No abnormal joints or musculature NEUROLOGIC: Cranial nerves 2-12 grossly intact. Moves all extremities PSYCHIATRIC: Mood and affect with dementia, no behavioral issues  Patient Active Problem List   Diagnosis Date Noted  . Klebsiella pneumoniae (k. pneumoniae) as the cause of diseases classified elsewhere 12/09/2016  . Pyelonephritis 12/09/2016  . Chronic diastolic CHF (congestive heart failure) ([place]) 12/02/2016  . Protein calorie malnutrition ([place]) 12/02/2016  . Macular degeneration 09/06/2016  . Depression 04/23/2016  . Poor appetite 10/11/2015  . Loss of weight 10/11/2015  . Diarrhea 09/10/2015  . Anxiety 07/04/2015  . Mood disorder ([place]) 07/04/2015  . DVT of lower extremity (deep venous thrombosis) ([place]) 01/23/2015  . OA (osteoarthritis) of knee 09/20/2014  . CKD (chronic kidney disease) stage 3, GFR 30-59 ml/min (HCC) 08/04/2014  . Anemia of chronic disease 08/04/2014  . Hyperlipidemia   . Essential hypertension 04/20/2014  . Vascular dementia with behavior disturbance 04/20/2014  . Paroxysmal atrial fibrillation ([place]) 04/20/2014  . Basal ganglia infarction ([place]) 04/17/2014  . History of breast cancer 08/23/2012    CMP     Component Value Date/Time   NA 143 01/12/2017   K 4.4 01/12/2017   CL 105 12/06/2016 0346   CO2 20 (L) 12/06/2016 0346   GLUCOSE 85 12/06/2016 0346   BUN 22 (A) 01/12/2017   CREATININE 1.1 01/12/2017   CREATININE 1.43 (H) 12/06/2016 0346   CALCIUM 7.6 (L) 12/06/2016 0346   PROT 6.3 (L) 12/02/2016 0556   ALBUMIN 2.3 (L) 12/02/2016 0556   AST 15 01/12/2017   ALT 10 01/12/2017   ALKPHOS 66 01/12/2017   BILITOT 0.9 12/02/2016 0556   GFRNONAA 31 (L) 12/06/2016 0346   GFRAA 36 (L)  12/06/2016 0346   Recent Labs    12/03/16 0103  12/04/16 0314  12/06/16 0346 12/12/16 01/12/17  NA 136   < > 135   < > 134* 144 143  K 3.9   < > 4.1  --  3.7 3.2* 4.4  CL 107  --  106  --  105  --   --   CO2 18*  --  19*  --  20*  --   --   GLUCOSE 131*  --  124*  --  85  --   --   BUN 33*   < > 27*   < > 23* 13 22*  CREATININE 2.28*   < > 1.93*   < > 1.43* 0.7 1.1  CALCIUM 7.2*  --  7.6*  --  7.6*  --   --    < > = values in this interval not displayed.  Recent Labs    08/12/16 12/02/16 0556 01/12/17  AST 14 16 15   ALT 10 13* 10  ALKPHOS 64 74 66  BILITOT  --  0.9  --   PROT  --  6.3*  --   ALBUMIN  --  2.3*  --    Recent Labs    12/02/16 0556  12/03/16 0103  12/04/16 0314 12/06/16 12/06/16 0346 01/12/17  WBC 21.5*   < > 13.1*   < > 10.8* 7.2 7.2 7.3  NEUTROABS 17.8*  --   --   --   --   --   --   --   HGB 10.5*   < > 9.7*   < > 10.5*  --  10.1* 10.0*  HCT 34.1*   < > 32.3*   < > 34.3*  --  32.7* 31*  MCV 89.5  --  90.0  --  88.6  --  88.9  --   PLT 341   < > 258   < > 230  --  192 220   < > = values in this interval not displayed.   Recent Labs    08/12/16 01/12/17 05/26/17  CHOL 89 97 97  LDLCALC 49 57 50  TRIG 108 102 73   No results found for: [facility] Lab Results  Component Value Date   TSH 4.80 01/12/2017   Lab Results  Component Value Date   HGBA1C 5.5 01/12/2017   Lab Results  Component Value Date   CHOL 97 05/26/2017   HDL 32 (A) 05/26/2017   LDLCALC 50 05/26/2017   TRIG 73 05/26/2017   CHOLHDL 5.7 04/18/2014    Significant Diagnostic Results in last 30 days:  No results found.  Assessment and Plan  Chronic diastolic CHF (congestive heart failure) (HCC) Chronic and stable without exacerbation; continue metoprolol 12.5 mg by mouth 3 times a day  Paroxysmal atrial fibrillation (HCC) Stable; rate controlled with metoprolol 12.5 mg by mouth 3 times a day and prophylaxed with Eliquis 5 mg by mouth twice a day  OA (osteoarthritis) of  knee No reports of pain or discomfort; continue Tylenol and Ultram when necessary    Webb Silversmith D. Sheppard Coil, MD

## 2017-06-11 DIAGNOSIS — F39 Unspecified mood [affective] disorder: Secondary | ICD-10-CM | POA: Diagnosis not present

## 2017-06-11 DIAGNOSIS — F419 Anxiety disorder, unspecified: Secondary | ICD-10-CM | POA: Diagnosis not present

## 2017-06-11 DIAGNOSIS — F0390 Unspecified dementia without behavioral disturbance: Secondary | ICD-10-CM | POA: Diagnosis not present

## 2017-07-07 ENCOUNTER — Non-Acute Institutional Stay (SKILLED_NURSING_FACILITY): Payer: Medicare Other | Admitting: Internal Medicine

## 2017-07-07 ENCOUNTER — Encounter: Payer: Self-pay | Admitting: Internal Medicine

## 2017-07-07 DIAGNOSIS — H3530 Unspecified macular degeneration: Secondary | ICD-10-CM

## 2017-07-07 DIAGNOSIS — I10 Essential (primary) hypertension: Secondary | ICD-10-CM

## 2017-07-07 DIAGNOSIS — F329 Major depressive disorder, single episode, unspecified: Secondary | ICD-10-CM

## 2017-07-07 DIAGNOSIS — F32A Depression, unspecified: Secondary | ICD-10-CM

## 2017-07-07 NOTE — Progress Notes (Signed)
Location:  [place] Room Number: 203D Place of Service:  SNF (31)  Hennie Duos, MD  Patient Care Team: Hennie Duos, MD as PCP - General (Internal Medicine)  Extended Emergency Contact Information Primary Emergency Contact: [facility] Address: [place], [place] [address] [name] of [place] Phone: [phone] Relation: Grandson    Allergies: Patient has no known allergies.  Chief Complaint  Patient presents with  . Medical Management of Chronic Issues    Routine Visit    HPI: Patient is 84 y.o. female who is being seen for routine issues of hypertension, macular degeneration, and depression.  Past Medical History:  Diagnosis Date  . Anemia of chronic disease 08/04/2014  . Anxiety 07/04/2015  . Arthritis   . Basal ganglia infarction ([place]) 04/17/2014  . Chronic diastolic CHF (congestive heart failure) ([place]) 12/02/2016  . CKD (chronic kidney disease) stage 3, GFR 30-59 ml/min ([place]) 08/04/2014  . COPD (chronic obstructive pulmonary disease) ([place])   . Dementia 02/02/2012  . Depression 04/23/2016  . DVT of lower extremity (deep venous thrombosis) ([place]) 01/23/2015  . Essential hypertension 04/20/2014  . History of breast cancer 2009   Left breast  s/p lumpectomy and rads. declined hormonal tx. last mammogram 2011  . Hyperlipidemia   . Hypertension   . OA (osteoarthritis) of knee 09/20/2014  . PAF (paroxysmal atrial fibrillation) ([place])   . Paroxysmal atrial fibrillation ([place]) 04/20/2014  . Pyelonephritis 12/09/2016  . SVT (supraventricular tachycardia) ([place])   . Vascular dementia with behavior disturbance 04/20/2014    Past Surgical History:  Procedure Laterality Date  . BREAST LUMPECTOMY Left 2009   left breast  . CESAREAN SECTION    . EYE SURGERY     cataracts x3    Allergies as of 07/07/2017   No Known Allergies     Medication List        Accurate as of 07/07/17 11:59 PM. Always use your most recent  med list.          atorvastatin 20 MG tablet Commonly known as:  LIPITOR Take 20 mg by mouth. Take one tablet daily   ELIQUIS 2.5 MG Tabs tablet Generic drug:  apixaban Take 2.5 mg by mouth 2 (two) times daily.   ferrous sulfate 325 (65 FE) MG tablet Take 325 mg by mouth. Take one tablet daily for anemia   LORazepam 0.5 MG tablet Commonly known as:  ATIVAN Take 0.5 mg by mouth. Take one tablet daily at lunch for anxiety   memantine 10 MG tablet Commonly known as:  NAMENDA Take 10 mg by mouth 2 (two) times daily.   metoprolol tartrate 25 MG tablet Commonly known as:  LOPRESSOR Take 0.5 tablets (12.5 mg total) by mouth 3 (three) times daily.   Propylene Glycol 0.6 % Soln Place 1 drop into both eyes 2 (two) times daily.   RA SALINE ENEMA 19-7 GM/118ML Enem Place 1 each rectally as needed (for constipation).   senna 8.6 MG Tabs tablet Commonly known as:  SENOKOT Take 1 tablet by mouth daily.       No orders of the defined types were placed in this encounter.   Immunization History  Administered Date(s) Administered  . Influenza Inj Mdck Quad Pf 03/09/2012  . Influenza Split 04/20/2009, 05/12/2010  . Influenza, Seasonal, Injecte, Preservative Fre 03/21/2013  . Influenza-Unspecified 03/22/2015  . PPD Test 05/08/2014  . Pneumococcal Polysaccharide-23 05/12/2010  . Td  01/14/2008  . Zoster 01/14/2008    Social History   Tobacco Use  . Smoking status: Never Smoker  . Smokeless tobacco: Never Used  Substance Use Topics  . Alcohol use: No    Review of Systems  unable to obtain secondary to dementia; nursing-no concerns    Vitals:   07/07/17 1010  BP: 112/67  Pulse: 67  Resp: 19  Temp: (!) 97.4 F (36.3 C)  SpO2: 96%   Body mass index is 31.11 kg/m. Physical Exam  GENERAL APPEARANCE: Alert, conversant, No acute distress  SKIN: No diaphoresis rash HEENT: Unremarkable RESPIRATORY: Breathing is even, unlabored. Lung sounds are clear     CARDIOVASCULAR: Heart RRR no murmurs, rubs or gallops. No peripheral edema  GASTROINTESTINAL: Abdomen is soft, non-tender, not distended w/ normal bowel sounds.  GENITOURINARY: Bladder non tender, not distended  MUSCULOSKELETAL: No abnormal joints or musculature NEUROLOGIC: Cranial nerves 2-12 grossly intact. Moves all extremities PSYCHIATRIC: Mood and affect with dementia, no behavioral issues  Patient Active Problem List   Diagnosis Date Noted  . Klebsiella pneumoniae (k. pneumoniae) as the cause of diseases classified elsewhere 12/09/2016  . Pyelonephritis 12/09/2016  . Chronic diastolic CHF (congestive heart failure) ([place]) 12/02/2016  . Protein calorie malnutrition ([place]) 12/02/2016  . Macular degeneration 09/06/2016  . Depression 04/23/2016  . Poor appetite 10/11/2015  . Loss of weight 10/11/2015  . Diarrhea 09/10/2015  . Anxiety 07/04/2015  . Mood disorder ([place]) 07/04/2015  . DVT of lower extremity (deep venous thrombosis) ([place]) 01/23/2015  . OA (osteoarthritis) of knee 09/20/2014  . CKD (chronic kidney disease) stage 3, GFR 30-59 ml/min (HCC) 08/04/2014  . Anemia of chronic disease 08/04/2014  . Hyperlipidemia   . Essential hypertension 04/20/2014  . Vascular dementia with behavior disturbance 04/20/2014  . Paroxysmal atrial fibrillation ([place]) 04/20/2014  . Basal ganglia infarction ([place]) 04/17/2014  . History of breast cancer 08/23/2012    CMP     Component Value Date/Time   NA 143 01/12/2017   K 4.4 01/12/2017   CL 105 12/06/2016 0346   CO2 20 (L) 12/06/2016 0346   GLUCOSE 85 12/06/2016 0346   BUN 22 (A) 01/12/2017   CREATININE 1.1 01/12/2017   CREATININE 1.43 (H) 12/06/2016 0346   CALCIUM 7.6 (L) 12/06/2016 0346   PROT 6.3 (L) 12/02/2016 0556   ALBUMIN 2.3 (L) 12/02/2016 0556   AST 15 01/12/2017   ALT 10 01/12/2017   ALKPHOS 66 01/12/2017   BILITOT 0.9 12/02/2016 0556   GFRNONAA 31 (L) 12/06/2016 0346   GFRAA 36 (L) 12/06/2016 0346   Recent Labs     12/03/16 0103  12/04/16 0314  12/06/16 0346 12/12/16 01/12/17  NA 136   < > 135   < > 134* 144 143  K 3.9   < > 4.1  --  3.7 3.2* 4.4  CL 107  --  106  --  105  --   --   CO2 18*  --  19*  --  20*  --   --   GLUCOSE 131*  --  124*  --  85  --   --   BUN 33*   < > 27*   < > 23* 13 22*  CREATININE 2.28*   < > 1.93*   < > 1.43* 0.7 1.1  CALCIUM 7.2*  --  7.6*  --  7.6*  --   --    < > = values in this interval not displayed.   Recent Labs  08/12/16 12/02/16 0556 01/12/17  AST 14 16 15   ALT 10 13* 10  ALKPHOS 64 74 66  BILITOT  --  0.9  --   PROT  --  6.3*  --   ALBUMIN  --  2.3*  --    Recent Labs    12/02/16 0556  12/03/16 0103  12/04/16 0314 12/06/16 12/06/16 0346 01/12/17  WBC 21.5*   < > 13.1*   < > 10.8* 7.2 7.2 7.3  NEUTROABS 17.8*  --   --   --   --   --   --   --   HGB 10.5*   < > 9.7*   < > 10.5*  --  10.1* 10.0*  HCT 34.1*   < > 32.3*   < > 34.3*  --  32.7* 31*  MCV 89.5  --  90.0  --  88.6  --  88.9  --   PLT 341   < > 258   < > 230  --  192 220   < > = values in this interval not displayed.   Recent Labs    08/12/16 01/12/17 05/26/17  CHOL 89 97 97  LDLCALC 49 57 50  TRIG 108 102 73   No results found for: [facility] Lab Results  Component Value Date   TSH 4.80 01/12/2017   Lab Results  Component Value Date   HGBA1C 5.5 01/12/2017   Lab Results  Component Value Date   CHOL 97 05/26/2017   HDL 32 (A) 05/26/2017   LDLCALC 50 05/26/2017   TRIG 73 05/26/2017   CHOLHDL 5.7 04/18/2014    Significant Diagnostic Results in last 30 days:  No results found.  Assessment and Plan  Essential hypertension Stable; plan to continue metoprolol 12.5 mg by mouth 3 times a day  Macular degeneration Stable; plan to continue Systane and weighs chewable tablets, chew 2 tablets daily  Depression Patient continues stable off any medications; continue to monitor    Anne D. Sheppard Coil,  MD

## 2017-07-10 DIAGNOSIS — F39 Unspecified mood [affective] disorder: Secondary | ICD-10-CM | POA: Diagnosis not present

## 2017-07-10 DIAGNOSIS — F0390 Unspecified dementia without behavioral disturbance: Secondary | ICD-10-CM | POA: Diagnosis not present

## 2017-07-10 DIAGNOSIS — F419 Anxiety disorder, unspecified: Secondary | ICD-10-CM | POA: Diagnosis not present

## 2017-07-17 ENCOUNTER — Encounter: Payer: Self-pay | Admitting: Internal Medicine

## 2017-07-17 NOTE — Assessment & Plan Note (Signed)
Stable; plan to continue metoprolol 12.5 mg by mouth 3 times a day

## 2017-07-17 NOTE — Assessment & Plan Note (Signed)
No reports of pain or discomfort; continue Tylenol and Ultram when necessary

## 2017-07-17 NOTE — Assessment & Plan Note (Signed)
Patient continues stable off any medications; continue to monitor

## 2017-07-17 NOTE — Assessment & Plan Note (Signed)
Stable; plan to continue Systane and weighs chewable tablets, chew 2 tablets daily

## 2017-07-17 NOTE — Assessment & Plan Note (Signed)
Chronic and stable without exacerbation; continue metoprolol 12.5 mg by mouth 3 times a day

## 2017-07-17 NOTE — Assessment & Plan Note (Signed)
Stable; rate controlled with metoprolol 12.5 mg by mouth 3 times a day and prophylaxed with Eliquis 5 mg by mouth twice a day

## 2017-07-23 DIAGNOSIS — E119 Type 2 diabetes mellitus without complications: Secondary | ICD-10-CM | POA: Diagnosis not present

## 2017-07-23 DIAGNOSIS — E039 Hypothyroidism, unspecified: Secondary | ICD-10-CM | POA: Diagnosis not present

## 2017-07-23 DIAGNOSIS — I10 Essential (primary) hypertension: Secondary | ICD-10-CM | POA: Diagnosis not present

## 2017-07-23 DIAGNOSIS — E785 Hyperlipidemia, unspecified: Secondary | ICD-10-CM | POA: Diagnosis not present

## 2017-07-23 DIAGNOSIS — E559 Vitamin D deficiency, unspecified: Secondary | ICD-10-CM | POA: Diagnosis not present

## 2017-07-23 LAB — CBC AND DIFFERENTIAL
HCT: 36 (ref 36–46)
Hemoglobin: 11.4 — AB (ref 12.0–16.0)
Platelets: 200 (ref 150–399)
WBC: 8.2

## 2017-07-23 LAB — BASIC METABOLIC PANEL
BUN: 34 — AB (ref 4–21)
CREATININE: 1.1 (ref 0.5–1.1)
Glucose: 89
Potassium: 4.6 (ref 3.4–5.3)
Sodium: 143 (ref 137–147)

## 2017-07-23 LAB — LIPID PANEL
CHOLESTEROL: 110 (ref 0–200)
HDL: 37 (ref 35–70)
LDL CALC: 60
TRIGLYCERIDES: 68 (ref 40–160)

## 2017-07-23 LAB — HEPATIC FUNCTION PANEL
ALK PHOS: 73 (ref 25–125)
ALT: 9 (ref 7–35)
AST: 12 — AB (ref 13–35)
Bilirubin, Total: 0.3

## 2017-07-23 LAB — TSH: TSH: 4.22 (ref 0.41–5.90)

## 2017-07-23 LAB — VITAMIN D 25 HYDROXY (VIT D DEFICIENCY, FRACTURES): VIT D 25 HYDROXY: 30.3

## 2017-07-23 LAB — HEMOGLOBIN A1C: Hemoglobin A1C: 5.6

## 2017-07-28 ENCOUNTER — Non-Acute Institutional Stay (SKILLED_NURSING_FACILITY): Payer: Medicare Other | Admitting: Internal Medicine

## 2017-07-28 DIAGNOSIS — R600 Localized edema: Secondary | ICD-10-CM

## 2017-08-01 ENCOUNTER — Encounter: Payer: Self-pay | Admitting: Internal Medicine

## 2017-08-01 NOTE — Progress Notes (Signed)
Location:  [place] of Service:  SNF (31)  Hennie Duos, MD  Patient Care Team: Hennie Duos, MD as PCP - General (Internal Medicine)  Extended Emergency Contact Information Primary Emergency Contact: [facility] Address: [place], [place] [address] [name] of [place] Phone: [phone] Relation: Grandson    Allergies: Patient has no known allergies.  Chief Complaint  Patient presents with  . Acute Visit    HPI: Patient is 84 y.o. female who I am seeing today for increased lower extremity edema. Patient says across from the nursing desk in her wheelchair every day. Over the last several days have noted increased swelling to both legs. Patient with dementia so really cannot give history but no apparent shortness of breath or chest pain. Patient has gained 1 pound of weight since early November 2018.  Past Medical History:  Diagnosis Date  . Anemia of chronic disease 08/04/2014  . Anxiety 07/04/2015  . Arthritis   . Basal ganglia infarction ([place]) 04/17/2014  . Chronic diastolic CHF (congestive heart failure) ([place]) 12/02/2016  . CKD (chronic kidney disease) stage 3, GFR 30-59 ml/min ([place]) 08/04/2014  . COPD (chronic obstructive pulmonary disease) ([place])   . Dementia 02/02/2012  . Depression 04/23/2016  . DVT of lower extremity (deep venous thrombosis) ([place]) 01/23/2015  . Essential hypertension 04/20/2014  . History of breast cancer 2009   Left breast  s/p lumpectomy and rads. declined hormonal tx. last mammogram 2011  . Hyperlipidemia   . Hypertension   . OA (osteoarthritis) of knee 09/20/2014  . PAF (paroxysmal atrial fibrillation) ([place])   . Paroxysmal atrial fibrillation ([place]) 04/20/2014  . Pyelonephritis 12/09/2016  . SVT (supraventricular tachycardia) ([place])   . Vascular dementia with behavior disturbance 04/20/2014    Past Surgical History:  Procedure Laterality Date  . BREAST LUMPECTOMY Left 2009   left breast    . CESAREAN SECTION    . EYE SURGERY     cataracts x3    Allergies as of 07/28/2017   No Known Allergies     Medication List        Accurate as of 07/28/17 11:59 PM. Always use your most recent med list.          atorvastatin 20 MG tablet Commonly known as:  LIPITOR Take 20 mg by mouth. Take one tablet daily   ELIQUIS 2.5 MG Tabs tablet Generic drug:  apixaban Take 2.5 mg by mouth 2 (two) times daily.   ferrous sulfate 325 (65 FE) MG tablet Take 325 mg by mouth. Take one tablet daily for anemia   LORazepam 0.5 MG tablet Commonly known as:  ATIVAN Take 0.5 mg by mouth. Take one tablet daily at lunch for anxiety   memantine 10 MG tablet Commonly known as:  NAMENDA Take 10 mg by mouth 2 (two) times daily.   metoprolol tartrate 25 MG tablet Commonly known as:  LOPRESSOR Take 0.5 tablets (12.5 mg total) by mouth 3 (three) times daily.   Propylene Glycol 0.6 % Soln Place 1 drop into both eyes 2 (two) times daily.   RA SALINE ENEMA 19-7 GM/118ML Enem Place 1 each rectally as needed (for constipation).   senna 8.6 MG Tabs tablet Commonly known as:  SENOKOT Take 1 tablet by mouth daily.       No orders of the defined types were placed in this encounter.   Immunization History  Administered Date(s) Administered  .  Influenza Inj Mdck Quad Pf 03/09/2012  . Influenza Split 04/20/2009, 05/12/2010  . Influenza, Seasonal, Injecte, Preservative Fre 03/21/2013  . Influenza-Unspecified 03/22/2015  . PPD Test 05/08/2014  . Pneumococcal Polysaccharide-23 05/12/2010  . Td 01/14/2008  . Zoster 01/14/2008    Social History   Tobacco Use  . Smoking status: Never Smoker  . Smokeless tobacco: Never Used  Substance Use Topics  . Alcohol use: No    Review of Systems  unable to obtain secondary to dementia; nursing-noted increased swelling to both legs-her baseline is some edema    Vitals:   08/01/17 1421  BP: 132/63  Pulse: 80  Resp: 16  Temp: (!) 97 F (36.1  C)   There is no height or weight on file to calculate BMI. Physical Exam  GENERAL APPEARANCE: Alert, conversant, No acute distress  SKIN: No diaphoresis rash HEENT: Unremarkable RESPIRATORY: Breathing is even, unlabored. Lung sounds are clear   CARDIOVASCULAR: Heart RRR no murmurs, rubs or gallops. 1-2 +  peripheral edema  GASTROINTESTINAL: Abdomen is soft, non-tender, not distended w/ normal bowel sounds.  GENITOURINARY: Bladder non tender, not distended  MUSCULOSKELETAL: No abnormal joints or musculature NEUROLOGIC: Cranial nerves 2-12 grossly intact. Moves all extremities PSYCHIATRIC: Dementia, no behavioral issues  Patient Active Problem List   Diagnosis Date Noted  . Klebsiella pneumoniae (k. pneumoniae) as the cause of diseases classified elsewhere 12/09/2016  . Pyelonephritis 12/09/2016  . Chronic diastolic CHF (congestive heart failure) ([place]) 12/02/2016  . Protein calorie malnutrition ([place]) 12/02/2016  . Macular degeneration 09/06/2016  . Depression 04/23/2016  . Poor appetite 10/11/2015  . Loss of weight 10/11/2015  . Diarrhea 09/10/2015  . Anxiety 07/04/2015  . Mood disorder ([place]) 07/04/2015  . DVT of lower extremity (deep venous thrombosis) ([place]) 01/23/2015  . OA (osteoarthritis) of knee 09/20/2014  . CKD (chronic kidney disease) stage 3, GFR 30-59 ml/min (HCC) 08/04/2014  . Anemia of chronic disease 08/04/2014  . Hyperlipidemia   . Essential hypertension 04/20/2014  . Vascular dementia with behavior disturbance 04/20/2014  . Paroxysmal atrial fibrillation ([place]) 04/20/2014  . Basal ganglia infarction ([place]) 04/17/2014  . History of breast cancer 08/23/2012    CMP     Component Value Date/Time   NA 143 01/12/2017   K 4.4 01/12/2017   CL 105 12/06/2016 0346   CO2 20 (L) 12/06/2016 0346   GLUCOSE 85 12/06/2016 0346   BUN 22 (A) 01/12/2017   CREATININE 1.1 01/12/2017   CREATININE 1.43 (H) 12/06/2016 0346   CALCIUM 7.6 (L) 12/06/2016 0346   PROT 6.3 (L)  12/02/2016 0556   ALBUMIN 2.3 (L) 12/02/2016 0556   AST 15 01/12/2017   ALT 10 01/12/2017   ALKPHOS 66 01/12/2017   BILITOT 0.9 12/02/2016 0556   GFRNONAA 31 (L) 12/06/2016 0346   GFRAA 36 (L) 12/06/2016 0346   Recent Labs    12/03/16 0103  12/04/16 0314  12/06/16 0346 12/12/16 01/12/17  NA 136   < > 135   < > 134* 144 143  K 3.9   < > 4.1  --  3.7 3.2* 4.4  CL 107  --  106  --  105  --   --   CO2 18*  --  19*  --  20*  --   --   GLUCOSE 131*  --  124*  --  85  --   --   BUN 33*   < > 27*   < > 23* 13 22*  CREATININE 2.28*   < >  1.93*   < > 1.43* 0.7 1.1  CALCIUM 7.2*  --  7.6*  --  7.6*  --   --    < > = values in this interval not displayed.   Recent Labs    08/12/16 12/02/16 0556 01/12/17  AST 14 16 15   ALT 10 13* 10  ALKPHOS 64 74 66  BILITOT  --  0.9  --   PROT  --  6.3*  --   ALBUMIN  --  2.3*  --    Recent Labs    12/02/16 0556  12/03/16 0103  12/04/16 0314  12/06/16 0346 01/12/17 07/23/17  WBC 21.5*   < > 13.1*   < > 10.8*   < > 7.2 7.3 8.2  NEUTROABS 17.8*  --   --   --   --   --   --   --   --   HGB 10.5*   < > 9.7*   < > 10.5*  --  10.1* 10.0* 11.4*  HCT 34.1*   < > 32.3*   < > 34.3*  --  32.7* 31* 36  MCV 89.5  --  90.0  --  88.6  --  88.9  --   --   PLT 341   < > 258   < > 230  --  192 220 200   < > = values in this interval not displayed.   Recent Labs    08/12/16 01/12/17 05/26/17  CHOL 89 97 97  LDLCALC 49 57 50  TRIG 108 102 73   No results found for: [facility] Lab Results  Component Value Date   TSH 4.80 01/12/2017   Lab Results  Component Value Date   HGBA1C 5.5 01/12/2017   Lab Results  Component Value Date   CHOL 97 05/26/2017   HDL 32 (A) 05/26/2017   LDLCALC 50 05/26/2017   TRIG 73 05/26/2017   CHOLHDL 5.7 04/18/2014    Significant Diagnostic Results in last 30 days:  No results found.  Assessment and Plan  Bilateral lower extremity edema-swelling is equal in both legs; last weight was 07/14/17 which reflected 1 pound  gain over early November/2018. We'll start Lasix 20 mg daily and monitor response    [name], MD

## 2017-08-03 DIAGNOSIS — I10 Essential (primary) hypertension: Secondary | ICD-10-CM | POA: Diagnosis not present

## 2017-08-03 DIAGNOSIS — D649 Anemia, unspecified: Secondary | ICD-10-CM | POA: Diagnosis not present

## 2017-08-03 LAB — BASIC METABOLIC PANEL
BUN: 32 — AB (ref 4–21)
CREATININE: 1.1 (ref 0.5–1.1)
Glucose: 80
POTASSIUM: 4.3 (ref 3.4–5.3)
SODIUM: 141 (ref 137–147)

## 2017-08-04 ENCOUNTER — Encounter: Payer: Self-pay | Admitting: Internal Medicine

## 2017-08-04 ENCOUNTER — Non-Acute Institutional Stay (SKILLED_NURSING_FACILITY): Payer: Medicare Other | Admitting: Internal Medicine

## 2017-08-04 DIAGNOSIS — N183 Chronic kidney disease, stage 3 unspecified: Secondary | ICD-10-CM

## 2017-08-04 DIAGNOSIS — F0151 Vascular dementia with behavioral disturbance: Secondary | ICD-10-CM

## 2017-08-04 DIAGNOSIS — E7849 Other hyperlipidemia: Secondary | ICD-10-CM

## 2017-08-04 DIAGNOSIS — F01518 Vascular dementia, unspecified severity, with other behavioral disturbance: Secondary | ICD-10-CM

## 2017-08-04 NOTE — Progress Notes (Signed)
Location:  [facility] Room Number: 203D Place of Service:  SNF (31)  Cindy Duos, MD  Patient Care Team: Cindy Duos, MD as PCP - General (Internal Medicine)  Extended Emergency Contact Information Primary Emergency Contact: [facility] Address: [address] [address] [address] Phone: [phone] Relation: Grandson    Allergies: Patient has no known allergies.  Chief Complaint  Patient presents with  . Medical Management of Chronic Issues    Routine Visit    HPI: Patient is 84 y.o. female who is being seen for routine issues of dementia, chronic kidney disease stage III, and hyperlipidemia.  Past Medical History:  Diagnosis Date  . Anemia of chronic disease 08/04/2014  . Anxiety 07/04/2015  . Arthritis   . Basal ganglia infarction ([place]) 04/17/2014  . Chronic diastolic CHF (congestive heart failure) ([place]) 12/02/2016  . CKD (chronic kidney disease) stage 3, GFR 30-59 ml/min ([place]) 08/04/2014  . COPD (chronic obstructive pulmonary disease) ([place])   . Dementia 02/02/2012  . Depression 04/23/2016  . DVT of lower extremity (deep venous thrombosis) ([place]) 01/23/2015  . Essential hypertension 04/20/2014  . History of breast cancer 2009   Left breast  s/p lumpectomy and rads. declined hormonal tx. last mammogram 2011  . Hyperlipidemia   . Hypertension   . OA (osteoarthritis) of knee 09/20/2014  . PAF (paroxysmal atrial fibrillation) ([place])   . Paroxysmal atrial fibrillation ([place]) 04/20/2014  . Pyelonephritis 12/09/2016  . SVT (supraventricular tachycardia) ([place])   . Vascular dementia with behavior disturbance 04/20/2014    Past Surgical History:  Procedure Laterality Date  . BREAST LUMPECTOMY Left 2009   left breast  . CESAREAN SECTION    . EYE SURGERY     cataracts x3    Allergies as of 08/04/2017   No Known Allergies     Medication List        Accurate as of 08/04/17 11:59 PM. Always use your  most recent med list.          acetaminophen 325 MG tablet Commonly known as:  TYLENOL Take 650 mg by mouth every 6 (six) hours as needed. Do not exceed 3000 mg in 24 hours   atorvastatin 20 MG tablet Commonly known as:  LIPITOR Take 20 mg by mouth daily. Take one tablet daily   ELIQUIS 2.5 MG Tabs tablet Generic drug:  apixaban Take 2.5 mg by mouth 2 (two) times daily.   ferrous sulfate 325 (65 FE) MG tablet Take 325 mg by mouth daily. anemia   furosemide 40 MG tablet Commonly known as:  LASIX Take 40 mg by mouth daily.   ICAPS AREDS 2 Chew Chew 2 tablets by mouth daily.   LORazepam 0.5 MG tablet Commonly known as:  ATIVAN Take 0.5 mg by mouth daily. anxiety   memantine 10 MG tablet Commonly known as:  NAMENDA Take 10 mg by mouth 2 (two) times daily.   metoprolol tartrate 25 MG tablet Commonly known as:  LOPRESSOR Take 0.5 tablets (12.5 mg total) by mouth 3 (three) times daily.   Propylene Glycol 0.6 % Soln Place 1 drop into both eyes 2 (two) times daily.   senna 8.6 MG Tabs tablet Commonly known as:  SENOKOT Take 1 tablet by mouth daily.       No orders of the defined types were placed in this encounter.   Immunization History  Administered Date(s) Administered  .  Influenza Inj Mdck Quad Pf 03/09/2012  . Influenza Split 04/20/2009, 05/12/2010  . Influenza, Seasonal, Injecte, Preservative Fre 03/21/2013  . Influenza-Unspecified 03/22/2015  . PPD Test 05/08/2014  . Pneumococcal Polysaccharide-23 05/12/2010  . Td 01/14/2008  . Zoster 01/14/2008    Social History   Tobacco Use  . Smoking status: Never Smoker  . Smokeless tobacco: Never Used  Substance Use Topics  . Alcohol use: No    Review of Systems  unable to obtain secondary to dementia and the: Nursing-no acute concerns    Vitals:   08/04/17 1052  BP: (!) 144/68  Pulse: 72  Resp: 18  Temp: 97.9 F (36.6 C)  SpO2: 95%   Body mass index is 31.55 kg/m. Physical Exam  GENERAL  APPEARANCE: Alert, conversant, No acute distress  SKIN: No diaphoresis rash HEENT: Unremarkable RESPIRATORY: Breathing is even, unlabored. Lung sounds are clear   CARDIOVASCULAR: Heart RRR no murmurs, rubs or gallops. + peripheral edema  GASTROINTESTINAL: Abdomen is soft, non-tender, not distended w/ normal bowel sounds.  GENITOURINARY: Bladder non tender, not distended  MUSCULOSKELETAL: No abnormal joints or musculature NEUROLOGIC: Cranial nerves 2-12 grossly intact. Moves all extremities PSYCHIATRIC: Dementia, no behavioral issues  Patient Active Problem List   Diagnosis Date Noted  . Klebsiella pneumoniae (k. pneumoniae) as the cause of diseases classified elsewhere 12/09/2016  . Pyelonephritis 12/09/2016  . Chronic diastolic CHF (congestive heart failure) ([place]) 12/02/2016  . Protein calorie malnutrition ([place]) 12/02/2016  . Macular degeneration 09/06/2016  . Depression 04/23/2016  . Poor appetite 10/11/2015  . Loss of weight 10/11/2015  . Diarrhea 09/10/2015  . Anxiety 07/04/2015  . Mood disorder ([place]) 07/04/2015  . DVT of lower extremity (deep venous thrombosis) ([place]) 01/23/2015  . OA (osteoarthritis) of knee 09/20/2014  . CKD (chronic kidney disease) stage 3, GFR 30-59 ml/min (HCC) 08/04/2014  . Anemia of chronic disease 08/04/2014  . Hyperlipidemia   . Essential hypertension 04/20/2014  . Vascular dementia with behavior disturbance 04/20/2014  . Paroxysmal atrial fibrillation ([place]) 04/20/2014  . Basal ganglia infarction ([place]) 04/17/2014  . History of breast cancer 08/23/2012    CMP     Component Value Date/Time   NA 141 08/03/2017   K 4.3 08/03/2017   CL 105 12/06/2016 0346   CO2 20 (L) 12/06/2016 0346   GLUCOSE 85 12/06/2016 0346   BUN 32 (A) 08/03/2017   CREATININE 1.1 08/03/2017   CREATININE 1.43 (H) 12/06/2016 0346   CALCIUM 7.6 (L) 12/06/2016 0346   PROT 6.3 (L) 12/02/2016 0556   ALBUMIN 2.3 (L) 12/02/2016 0556   AST 12 (A) 07/23/2017   ALT 9 07/23/2017     ALKPHOS 73 07/23/2017   BILITOT 0.9 12/02/2016 0556   GFRNONAA 31 (L) 12/06/2016 0346   GFRAA 36 (L) 12/06/2016 0346   Recent Labs    12/03/16 0103  12/04/16 0314  12/06/16 0346  01/12/17 07/23/17 08/03/17  NA 136   < > 135   < > 134*   < > 143 143 141  K 3.9   < > 4.1  --  3.7   < > 4.4 4.6 4.3  CL 107  --  106  --  105  --   --   --   --   CO2 18*  --  19*  --  20*  --   --   --   --   GLUCOSE 131*  --  124*  --  85  --   --   --   --  BUN 33*   < > 27*   < > 23*   < > 22* 34* 32*  CREATININE 2.28*   < > 1.93*   < > 1.43*   < > 1.1 1.1 1.1  CALCIUM 7.2*  --  7.6*  --  7.6*  --   --   --   --    < > = values in this interval not displayed.   Recent Labs    12/02/16 0556 01/12/17 07/23/17  AST 16 15 12*  ALT 13* 10 9  ALKPHOS 74 66 73  BILITOT 0.9  --   --   PROT 6.3*  --   --   ALBUMIN 2.3*  --   --    Recent Labs    12/02/16 0556  12/03/16 0103  12/04/16 0314  12/06/16 0346 01/12/17 07/23/17  WBC 21.5*   < > 13.1*   < > 10.8*   < > 7.2 7.3 8.2  NEUTROABS 17.8*  --   --   --   --   --   --   --   --   HGB 10.5*   < > 9.7*   < > 10.5*  --  10.1* 10.0* 11.4*  HCT 34.1*   < > 32.3*   < > 34.3*  --  32.7* 31* 36  MCV 89.5  --  90.0  --  88.6  --  88.9  --   --   PLT 341   < > 258   < > 230  --  192 220 200   < > = values in this interval not displayed.   Recent Labs    01/12/17 05/26/17 07/23/17  CHOL 97 97 110  LDLCALC 57 50 60  TRIG 102 73 68   No results found for: [facility] Lab Results  Component Value Date   TSH 4.22 07/23/2017   Lab Results  Component Value Date   HGBA1C 5.6 07/23/2017   Lab Results  Component Value Date   CHOL 110 07/23/2017   HDL 37 07/23/2017   LDLCALC 60 07/23/2017   TRIG 68 07/23/2017   CHOLHDL 5.7 04/18/2014    Significant Diagnostic Results in last 30 days:  No results found.  Assessment and Plan  Vascular dementia with behavior disturbance Chronic with no major declines; continue Namenda 10 mg by mouth twice a  day  CKD (chronic kidney disease) stage 3, GFR 30-59 ml/min Recent creatinine 1.1 which is stable from prior will continue to monitor intervals  Hyperlipidemia LDL 60, HDL 37, glucose control; continue Lipitor 20 mg by mouth daily     Kyarah Enamorado D. Sheppard Coil, MD

## 2017-08-05 ENCOUNTER — Encounter: Payer: Self-pay | Admitting: Internal Medicine

## 2017-08-05 DIAGNOSIS — R488 Other symbolic dysfunctions: Secondary | ICD-10-CM | POA: Diagnosis not present

## 2017-08-05 DIAGNOSIS — R41841 Cognitive communication deficit: Secondary | ICD-10-CM | POA: Diagnosis not present

## 2017-08-05 NOTE — Assessment & Plan Note (Signed)
LDL 60, HDL 37, glucose control; continue Lipitor 20 mg by mouth daily

## 2017-08-05 NOTE — Assessment & Plan Note (Signed)
Recent creatinine 1.1 which is stable from prior will continue to monitor intervals

## 2017-08-05 NOTE — Assessment & Plan Note (Signed)
Chronic with no major declines; continue Namenda 10 mg by mouth twice a day

## 2017-08-07 DIAGNOSIS — F39 Unspecified mood [affective] disorder: Secondary | ICD-10-CM | POA: Diagnosis not present

## 2017-08-07 DIAGNOSIS — F419 Anxiety disorder, unspecified: Secondary | ICD-10-CM | POA: Diagnosis not present

## 2017-08-07 DIAGNOSIS — F0390 Unspecified dementia without behavioral disturbance: Secondary | ICD-10-CM | POA: Diagnosis not present

## 2017-08-13 DIAGNOSIS — B351 Tinea unguium: Secondary | ICD-10-CM | POA: Diagnosis not present

## 2017-08-13 DIAGNOSIS — I739 Peripheral vascular disease, unspecified: Secondary | ICD-10-CM | POA: Diagnosis not present

## 2017-08-31 DIAGNOSIS — F39 Unspecified mood [affective] disorder: Secondary | ICD-10-CM | POA: Diagnosis not present

## 2017-08-31 DIAGNOSIS — F0390 Unspecified dementia without behavioral disturbance: Secondary | ICD-10-CM | POA: Diagnosis not present

## 2017-08-31 DIAGNOSIS — F419 Anxiety disorder, unspecified: Secondary | ICD-10-CM | POA: Diagnosis not present

## 2017-09-01 ENCOUNTER — Non-Acute Institutional Stay (SKILLED_NURSING_FACILITY): Payer: Medicare Other | Admitting: Internal Medicine

## 2017-09-01 ENCOUNTER — Encounter: Payer: Self-pay | Admitting: Internal Medicine

## 2017-09-01 DIAGNOSIS — I5032 Chronic diastolic (congestive) heart failure: Secondary | ICD-10-CM | POA: Diagnosis not present

## 2017-09-01 DIAGNOSIS — F419 Anxiety disorder, unspecified: Secondary | ICD-10-CM | POA: Diagnosis not present

## 2017-09-01 DIAGNOSIS — F329 Major depressive disorder, single episode, unspecified: Secondary | ICD-10-CM | POA: Diagnosis not present

## 2017-09-01 DIAGNOSIS — F32A Depression, unspecified: Secondary | ICD-10-CM

## 2017-09-01 NOTE — Progress Notes (Signed)
Location:  [facility] Room Number: 203D Place of Service:  SNF (31)  Hennie Duos, MD  Patient Care Team: Hennie Duos, MD as PCP - General (Internal Medicine)  Extended Emergency Contact Information Primary Emergency Contact: [facility] Address: [place], [place] [address] [name] Phone: [phone] Relation: Grandson    Allergies: Patient has no known allergies.  Chief Complaint  Patient presents with  . Medical Management of Chronic Issues    Routine Visit    HPI: Patient is 84 y.o. female who is being seen for routine issues of anxiety, chronic congestive heart failure, and depression.  Past Medical History:  Diagnosis Date  . Anemia of chronic disease 08/04/2014  . Anxiety 07/04/2015  . Arthritis   . Basal ganglia infarction ([place]) 04/17/2014  . Chronic diastolic CHF (congestive heart failure) ([place]) 12/02/2016  . CKD (chronic kidney disease) stage 3, GFR 30-59 ml/min ([place]) 08/04/2014  . COPD (chronic obstructive pulmonary disease) ([place])   . Dementia 02/02/2012  . Depression 04/23/2016  . DVT of lower extremity (deep venous thrombosis) ([place]) 01/23/2015  . Essential hypertension 04/20/2014  . History of breast cancer 2009   Left breast  s/p lumpectomy and rads. declined hormonal tx. last mammogram 2011  . Hyperlipidemia   . Hypertension   . OA (osteoarthritis) of knee 09/20/2014  . PAF (paroxysmal atrial fibrillation) ([place])   . Paroxysmal atrial fibrillation ([place]) 04/20/2014  . Pyelonephritis 12/09/2016  . SVT (supraventricular tachycardia) ([place])   . Vascular dementia with behavior disturbance 04/20/2014    Past Surgical History:  Procedure Laterality Date  . BREAST LUMPECTOMY Left 2009   left breast  . CESAREAN SECTION    . EYE SURGERY     cataracts x3    Allergies as of 09/01/2017   No Known Allergies     Medication List        Accurate as of 09/01/17 11:59 PM. Always use your most  recent med list.          acetaminophen 325 MG tablet Commonly known as:  TYLENOL Take 650 mg by mouth every 6 (six) hours as needed. Do not exceed 3000 mg in 24 hours   atorvastatin 20 MG tablet Commonly known as:  LIPITOR Take 20 mg by mouth daily.   ELIQUIS 2.5 MG Tabs tablet Generic drug:  apixaban Take 2.5 mg by mouth 2 (two) times daily.   ferrous sulfate 325 (65 FE) MG tablet Take 325 mg by mouth daily. anemia   furosemide 40 MG tablet Commonly known as:  LASIX Take 40 mg by mouth daily.   ICAPS AREDS 2 Chew Chew 2 tablets by mouth daily.   LORazepam 0.5 MG tablet Commonly known as:  ATIVAN Take 0.5 mg by mouth 2 (two) times daily. Hold for sedation   memantine 10 MG tablet Commonly known as:  NAMENDA Take 10 mg by mouth 2 (two) times daily.   metoprolol tartrate 25 MG tablet Commonly known as:  LOPRESSOR Take 0.5 tablets (12.5 mg total) by mouth 3 (three) times daily.   Propylene Glycol 0.6 % Soln Place 1 drop into both eyes 2 (two) times daily.   senna 8.6 MG Tabs tablet Commonly known as:  SENOKOT Take 1 tablet by mouth daily.   traZODone 50 MG tablet Commonly known as:  DESYREL Take 50 mg by mouth at bedtime.       No orders of  the defined types were placed in this encounter.   Immunization History  Administered Date(s) Administered  . Influenza Inj Mdck Quad Pf 03/09/2012  . Influenza Split 04/20/2009, 05/12/2010  . Influenza, Seasonal, Injecte, Preservative Fre 03/21/2013  . Influenza-Unspecified 03/22/2015  . PPD Test 05/08/2014  . Pneumococcal Polysaccharide-23 05/12/2010  . Td 01/14/2008  . Zoster 01/14/2008    Social History   Tobacco Use  . Smoking status: Never Smoker  . Smokeless tobacco: Never Used  Substance Use Topics  . Alcohol use: No    Review of Systems  unable to obtain secondary to dementia; nursing-no acute concerns   Vitals:   09/01/17 1039  BP: 128/65  Pulse: 74  Resp: 16  Temp: (!) 97.1 F (36.2 C)    SpO2: 95%   Body mass index is 32.28 kg/m. Physical Exam  GENERAL APPEARANCE: Alert, conversant, No acute distress  SKIN: No diaphoresis rash HEENT: Unremarkable RESPIRATORY: Breathing is even, unlabored. Lung sounds are clear   CARDIOVASCULAR: Heart RRR no murmurs, rubs or gallops. No peripheral edema  GASTROINTESTINAL: Abdomen is soft, non-tender, not distended w/ normal bowel sounds.  GENITOURINARY: Bladder non tender, not distended  MUSCULOSKELETAL: No abnormal joints or musculature NEUROLOGIC: Cranial nerves 2-12 grossly intact. Moves all extremities PSYCHIATRIC: Dementia, no behavioral issues  Patient Active Problem List   Diagnosis Date Noted  . Klebsiella pneumoniae (k. pneumoniae) as the cause of diseases classified elsewhere 12/09/2016  . Pyelonephritis 12/09/2016  . Chronic diastolic CHF (congestive heart failure) ([place]) 12/02/2016  . Protein calorie malnutrition ([place]) 12/02/2016  . Macular degeneration 09/06/2016  . Depression 04/23/2016  . Poor appetite 10/11/2015  . Loss of weight 10/11/2015  . Diarrhea 09/10/2015  . Anxiety 07/04/2015  . Mood disorder ([place]) 07/04/2015  . DVT of lower extremity (deep venous thrombosis) ([place]) 01/23/2015  . OA (osteoarthritis) of knee 09/20/2014  . CKD (chronic kidney disease) stage 3, GFR 30-59 ml/min (HCC) 08/04/2014  . Anemia of chronic disease 08/04/2014  . Hyperlipidemia   . Essential hypertension 04/20/2014  . Vascular dementia with behavior disturbance 04/20/2014  . Paroxysmal atrial fibrillation ([place]) 04/20/2014  . Basal ganglia infarction ([place]) 04/17/2014  . History of breast cancer 08/23/2012    CMP     Component Value Date/Time   NA 141 08/03/2017   K 4.3 08/03/2017   CL 105 12/06/2016 0346   CO2 20 (L) 12/06/2016 0346   GLUCOSE 85 12/06/2016 0346   BUN 32 (A) 08/03/2017   CREATININE 1.1 08/03/2017   CREATININE 1.43 (H) 12/06/2016 0346   CALCIUM 7.6 (L) 12/06/2016 0346   PROT 6.3 (L) 12/02/2016 0556    ALBUMIN 2.3 (L) 12/02/2016 0556   AST 12 (A) 07/23/2017   ALT 9 07/23/2017   ALKPHOS 73 07/23/2017   BILITOT 0.9 12/02/2016 0556   GFRNONAA 31 (L) 12/06/2016 0346   GFRAA 36 (L) 12/06/2016 0346   Recent Labs    12/03/16 0103  12/04/16 0314  12/06/16 0346  01/12/17 07/23/17 08/03/17  NA 136   < > 135   < > 134*   < > 143 143 141  K 3.9   < > 4.1  --  3.7   < > 4.4 4.6 4.3  CL 107  --  106  --  105  --   --   --   --   CO2 18*  --  19*  --  20*  --   --   --   --   GLUCOSE 131*  --  124*  --  85  --   --   --   --   BUN 33*   < > 27*   < > 23*   < > 22* 34* 32*  CREATININE 2.28*   < > 1.93*   < > 1.43*   < > 1.1 1.1 1.1  CALCIUM 7.2*  --  7.6*  --  7.6*  --   --   --   --    < > = values in this interval not displayed.   Recent Labs    12/02/16 0556 01/12/17 07/23/17  AST 16 15 12*  ALT 13* 10 9  ALKPHOS 74 66 73  BILITOT 0.9  --   --   PROT 6.3*  --   --   ALBUMIN 2.3*  --   --    Recent Labs    12/02/16 0556  12/03/16 0103  12/04/16 0314  12/06/16 0346 01/12/17 07/23/17  WBC 21.5*   < > 13.1*   < > 10.8*   < > 7.2 7.3 8.2  NEUTROABS 17.8*  --   --   --   --   --   --   --   --   HGB 10.5*   < > 9.7*   < > 10.5*  --  10.1* 10.0* 11.4*  HCT 34.1*   < > 32.3*   < > 34.3*  --  32.7* 31* 36  MCV 89.5  --  90.0  --  88.6  --  88.9  --   --   PLT 341   < > 258   < > 230  --  192 220 200   < > = values in this interval not displayed.   Recent Labs    01/12/17 05/26/17 07/23/17  CHOL 97 97 110  LDLCALC 57 50 60  TRIG 102 73 68   No results found for: [facility] Lab Results  Component Value Date   TSH 4.22 07/23/2017   Lab Results  Component Value Date   HGBA1C 5.6 07/23/2017   Lab Results  Component Value Date   CHOL 110 07/23/2017   HDL 37 07/23/2017   LDLCALC 60 07/23/2017   TRIG 68 07/23/2017   CHOLHDL 5.7 04/18/2014    Significant Diagnostic Results in last 30 days:  No results found.  Assessment and Plan  Anxiety Improved control on Ativan 0.5  mg twice a day  Chronic diastolic CHF (congestive heart failure) ([place]) Without exacerbation; continue metoprolol 12.5 mg twice a day    Gadge Hermiz D. Sheppard Coil, MD

## 2017-09-05 ENCOUNTER — Encounter: Payer: Self-pay | Admitting: Internal Medicine

## 2017-09-05 NOTE — Assessment & Plan Note (Signed)
Without exacerbation; continue metoprolol 12.5 mg twice a day

## 2017-09-05 NOTE — Assessment & Plan Note (Signed)
Improved control on Ativan 0.5 mg twice a day

## 2017-09-10 DIAGNOSIS — I69354 Hemiplegia and hemiparesis following cerebral infarction affecting left non-dominant side: Secondary | ICD-10-CM | POA: Diagnosis not present

## 2017-09-10 DIAGNOSIS — I69993 Ataxia following unspecified cerebrovascular disease: Secondary | ICD-10-CM | POA: Diagnosis not present

## 2017-10-01 DIAGNOSIS — F419 Anxiety disorder, unspecified: Secondary | ICD-10-CM | POA: Diagnosis not present

## 2017-10-01 DIAGNOSIS — F39 Unspecified mood [affective] disorder: Secondary | ICD-10-CM | POA: Diagnosis not present

## 2017-10-01 DIAGNOSIS — F0390 Unspecified dementia without behavioral disturbance: Secondary | ICD-10-CM | POA: Diagnosis not present

## 2017-10-01 DIAGNOSIS — G4700 Insomnia, unspecified: Secondary | ICD-10-CM | POA: Diagnosis not present

## 2017-10-20 DIAGNOSIS — I739 Peripheral vascular disease, unspecified: Secondary | ICD-10-CM | POA: Diagnosis not present

## 2017-10-20 DIAGNOSIS — B351 Tinea unguium: Secondary | ICD-10-CM | POA: Diagnosis not present

## 2017-10-20 DIAGNOSIS — R262 Difficulty in walking, not elsewhere classified: Secondary | ICD-10-CM | POA: Diagnosis not present

## 2017-10-22 ENCOUNTER — Encounter: Payer: Self-pay | Admitting: Adult Health

## 2017-10-22 ENCOUNTER — Non-Acute Institutional Stay (SKILLED_NURSING_FACILITY): Payer: Medicare Other | Admitting: Adult Health

## 2017-10-22 DIAGNOSIS — D638 Anemia in other chronic diseases classified elsewhere: Secondary | ICD-10-CM

## 2017-10-22 DIAGNOSIS — F0151 Vascular dementia with behavioral disturbance: Secondary | ICD-10-CM | POA: Diagnosis not present

## 2017-10-22 DIAGNOSIS — I480 Paroxysmal atrial fibrillation: Secondary | ICD-10-CM | POA: Diagnosis not present

## 2017-10-22 DIAGNOSIS — F01518 Vascular dementia, unspecified severity, with other behavioral disturbance: Secondary | ICD-10-CM

## 2017-10-22 DIAGNOSIS — N183 Chronic kidney disease, stage 3 unspecified: Secondary | ICD-10-CM

## 2017-10-22 DIAGNOSIS — E785 Hyperlipidemia, unspecified: Secondary | ICD-10-CM

## 2017-10-22 DIAGNOSIS — I5032 Chronic diastolic (congestive) heart failure: Secondary | ICD-10-CM

## 2017-10-22 NOTE — Progress Notes (Signed)
Location:   [facility] Room Number: 17 D Place of Service:  SNF (31)   CODE STATUS: DNR  No Known Allergies  Chief Complaint  Patient presents with  . Medical Management of Chronic Issues    Chf; afib; dementia    HPI:  She is a 84 year old long term resident of this facility being seen for the management of her chronic illnesses: chf; afib; dementia. She is unable to participate in the hpi or ros. There are no reports of cough; shortness of breath or changes in appetite. There are no nursing concerns at this time.   Past Medical History:  Diagnosis Date  . Anemia of chronic disease 08/04/2014  . Anxiety 07/04/2015  . Arthritis   . Basal ganglia infarction ([place]) 04/17/2014  . Chronic diastolic CHF (congestive heart failure) ([place]) 12/02/2016  . CKD (chronic kidney disease) stage 3, GFR 30-59 ml/min ([place]) 08/04/2014  . COPD (chronic obstructive pulmonary disease) ([place])   . Dementia 02/02/2012  . Depression 04/23/2016  . DVT of lower extremity (deep venous thrombosis) ([place]) 01/23/2015  . Essential hypertension 04/20/2014  . History of breast cancer 2009   Left breast  s/p lumpectomy and rads. declined hormonal tx. last mammogram 2011  . Hyperlipidemia   . Hypertension   . OA (osteoarthritis) of knee 09/20/2014  . PAF (paroxysmal atrial fibrillation) ([place])   . Paroxysmal atrial fibrillation ([place]) 04/20/2014  . Pyelonephritis 12/09/2016  . SVT (supraventricular tachycardia) ([place])   . Vascular dementia with behavior disturbance 04/20/2014    Past Surgical History:  Procedure Laterality Date  . BREAST LUMPECTOMY Left 2009   left breast  . CESAREAN SECTION    . EYE SURGERY     cataracts x3    Social History   Socioeconomic History  . Marital status: Single    Spouse name: Not on file  . Number of children: Not on file  . Years of education: Not on file  . Highest education level: Not on file  Occupational History  . Occupation: retired  Engineer, maintenance (IT)  . Financial resource strain: Not on file  . Food insecurity:    Worry: Not on file    Inability: Not on file  . Transportation needs:    Medical: Not on file    Non-medical: Not on file  Tobacco Use  . Smoking status: Never Smoker  . Smokeless tobacco: Never Used  Substance and Sexual Activity  . Alcohol use: No  . Drug use: No  . Sexual activity: Never  Lifestyle  . Physical activity:    Days per week: Not on file    Minutes per session: Not on file  . Stress: Not on file  Relationships  . Social connections:    Talks on phone: Not on file    Gets together: Not on file    Attends religious service: Not on file    Active member of club or organization: Not on file    Attends meetings of clubs or organizations: Not on file    Relationship status: Not on file  . Intimate partner violence:    Fear of current or ex partner: Not on file    Emotionally abused: Not on file    Physically abused: Not on file    Forced sexual activity: Not on file  Other Topics Concern  . Not on file  Social History Narrative   Admitted to [facility] 04/21/14  Never married   Never smoked   Alcohol none   POA, DNR       Family History  Problem Relation Age of Onset  . Heart disease Mother   . Diabetes Mother   . Heart disease Father   . Diabetes Sister   . Hypertension Sister       VITAL SIGNS BP 132/70   Pulse 78   Temp 97.9 F (36.6 C)   Resp 18   Ht 5\' 3"  (1.6 m)   Wt 178 lb 3.2 oz (80.8 kg)   LMP  (LMP Unknown)   BMI 31.57 kg/m   Outpatient Encounter Medications as of 10/22/2017  Medication Sig  . acetaminophen (TYLENOL) 325 MG tablet Take 650 mg by mouth every 6 (six) hours as needed. Do not exceed 3000 mg in 24 hours  . apixaban (ELIQUIS) 2.5 MG TABS tablet Take 2.5 mg by mouth 2 (two) times daily.  . ARTIFICIAL TEAR OP Apply to eye. Instill 1 drop in both eyes twice daily (wait 3-5 minutes between different eye drops)  .  atorvastatin (LIPITOR) 20 MG tablet Take 20 mg by mouth daily.   . ferrous sulfate 325 (65 FE) MG tablet Take 325 mg by mouth daily. anemia  . furosemide (LASIX) 40 MG tablet Take 40 mg by mouth daily.  Marland Kitchen LORazepam (ATIVAN) 0.5 MG tablet Take 0.5 mg by mouth 2 (two) times daily. Hold for sedation  . memantine (NAMENDA) 10 MG tablet Take 10 mg by mouth 2 (two) times daily.  . metoprolol tartrate (LOPRESSOR) 25 MG tablet Take 0.5 tablets (12.5 mg total) by mouth 3 (three) times daily.  . Multiple Vitamins-Minerals (ICAPS AREDS 2) CHEW Chew 2 tablets by mouth daily.  . Nutritional Supplements (NUTRITIONAL SUPPLEMENT PO) Mechanical Soft with not theraputic restrictions.  Nectar Thick liquids.  Soda and tomato juice ok unthickened  . Propylene Glycol 0.6 % SOLN Place 1 drop into both eyes 2 (two) times daily.  Marland Kitchen senna (SENOKOT) 8.6 MG TABS tablet Take 1 tablet by mouth daily.  . traZODone (DESYREL) 50 MG tablet Take 50 mg by mouth at bedtime.   No facility-administered encounter medications on file as of 10/22/2017.      SIGNIFICANT DIAGNOSTIC EXAMS  LABS REVIEWED:TODAY:   08-03-17: glucose 80; bun 32; creat 1.1; k+ 4.3; na++ 141   Review of Systems  Unable to perform ROS: Dementia (confused )    Physical Exam  Constitutional: She appears well-developed and well-nourished. No distress.  Neck: No thyromegaly present.  Cardiovascular: Normal rate, regular rhythm, normal heart sounds and intact distal pulses.  Pulmonary/Chest: Effort normal and breath sounds normal.  Abdominal: Soft. Bowel sounds are normal. She exhibits no distension. There is no tenderness.  Musculoskeletal: Normal range of motion. She exhibits no edema.  Lymphadenopathy:    She has no cervical adenopathy.  Neurological: She is alert.  Skin: Skin is warm and dry. She is not diaphoretic.  Psychiatric: She has a normal mood and affect.    ASSESSMENT/ PLAN:  TODAY:   1. Chronic diastolic heart failure: stable will  continue lasix 40 mg daily   2. Paroxysmal atrial fibrillation: stable will continue lopressor 12.5 mg three times daily and eliquis 2.5 mg twice daily   3. Essential hypertension: stable 1232/70: will continue lopressor 12.5 mg three times daily   4. Vascular dementia with behavior disturbance: without change weight 178 pounds; will continue namenda 10 mg twice daily   5. CKD stage 3: stable bun 22  creat 1.1  6. Anemia of chronic disease: stable will continue iron daily   7. Dyslipidemia: stable will continue lipitor 20 mg daily   8. Anxiety: is stable will continue ativan 0.5 mg twice daily takes trazodone 50 mg nightly     MD is aware of resident's narcotic use and is in agreement with current plan of care. We will attempt to wean resident as apropriate   Ok Edwards NP [facility]  Contact [phone] Monday through Friday 8am- 5pm  After hours call [phone]

## 2017-10-23 DIAGNOSIS — E785 Hyperlipidemia, unspecified: Secondary | ICD-10-CM | POA: Insufficient documentation

## 2017-10-29 DIAGNOSIS — F419 Anxiety disorder, unspecified: Secondary | ICD-10-CM | POA: Diagnosis not present

## 2017-10-29 DIAGNOSIS — G4700 Insomnia, unspecified: Secondary | ICD-10-CM | POA: Diagnosis not present

## 2017-10-29 DIAGNOSIS — F0390 Unspecified dementia without behavioral disturbance: Secondary | ICD-10-CM | POA: Diagnosis not present

## 2017-10-29 DIAGNOSIS — F39 Unspecified mood [affective] disorder: Secondary | ICD-10-CM | POA: Diagnosis not present

## 2017-11-03 DIAGNOSIS — R1312 Dysphagia, oropharyngeal phase: Secondary | ICD-10-CM | POA: Diagnosis not present

## 2017-11-12 DIAGNOSIS — H26493 Other secondary cataract, bilateral: Secondary | ICD-10-CM | POA: Diagnosis not present

## 2017-11-12 DIAGNOSIS — H353132 Nonexudative age-related macular degeneration, bilateral, intermediate dry stage: Secondary | ICD-10-CM | POA: Diagnosis not present

## 2017-11-12 DIAGNOSIS — H04123 Dry eye syndrome of bilateral lacrimal glands: Secondary | ICD-10-CM | POA: Diagnosis not present

## 2017-11-12 DIAGNOSIS — Z961 Presence of intraocular lens: Secondary | ICD-10-CM | POA: Diagnosis not present

## 2017-11-13 ENCOUNTER — Encounter: Payer: Self-pay | Admitting: Internal Medicine

## 2017-11-13 ENCOUNTER — Non-Acute Institutional Stay (SKILLED_NURSING_FACILITY): Payer: Medicare Other | Admitting: Internal Medicine

## 2017-11-13 DIAGNOSIS — M1712 Unilateral primary osteoarthritis, left knee: Secondary | ICD-10-CM

## 2017-11-13 DIAGNOSIS — I480 Paroxysmal atrial fibrillation: Secondary | ICD-10-CM

## 2017-11-13 DIAGNOSIS — H3530 Unspecified macular degeneration: Secondary | ICD-10-CM

## 2017-11-24 DIAGNOSIS — F39 Unspecified mood [affective] disorder: Secondary | ICD-10-CM | POA: Diagnosis not present

## 2017-11-24 DIAGNOSIS — F0390 Unspecified dementia without behavioral disturbance: Secondary | ICD-10-CM | POA: Diagnosis not present

## 2017-11-24 DIAGNOSIS — F419 Anxiety disorder, unspecified: Secondary | ICD-10-CM | POA: Diagnosis not present

## 2017-12-02 DIAGNOSIS — M25551 Pain in right hip: Secondary | ICD-10-CM | POA: Diagnosis not present

## 2017-12-06 ENCOUNTER — Encounter: Payer: Self-pay | Admitting: Internal Medicine

## 2017-12-06 NOTE — Assessment & Plan Note (Deleted)
Gradual steady decline, daughter remains every morning with her mother; continue Namenda 10 mg twice daily

## 2017-12-06 NOTE — Assessment & Plan Note (Signed)
Stable; continue ICaps areds chews p.o. twice daily

## 2017-12-06 NOTE — Assessment & Plan Note (Signed)
Reports of problems or pain; continue 650 of Tylenol every 6 hours as needed

## 2017-12-06 NOTE — Progress Notes (Signed)
Location:  [facility] Room Number: 217-D Place of Service:  SNF (31)  Hennie Duos, MD  Patient Care Team: Hennie Duos, MD as PCP - General (Internal Medicine)  Extended Emergency Contact Information Primary Emergency Contact: [facility] Address: [place], [place] [address] [name] of [place] Phone: [phone] Relation: Grandson    Allergies: Patient has no known allergies.  Chief Complaint  Patient presents with  . Medical Management of Chronic Issues    Routine Visit     HPI: Patient is 84 y.o. female who is being seen for routine issues of paroxysmal atrial fibrillation, osteoarthritis of the left knee, and macular degeneration.  Past Medical History:  Diagnosis Date  . Anemia of chronic disease 08/04/2014  . Anxiety 07/04/2015  . Arthritis   . Basal ganglia infarction ([place]) 04/17/2014  . Chronic diastolic CHF (congestive heart failure) ([place]) 12/02/2016  . CKD (chronic kidney disease) stage 3, GFR 30-59 ml/min ([place]) 08/04/2014  . COPD (chronic obstructive pulmonary disease) ([place])   . Dementia 02/02/2012  . Depression 04/23/2016  . DVT of lower extremity (deep venous thrombosis) ([place]) 01/23/2015  . Essential hypertension 04/20/2014  . History of breast cancer 2009   Left breast  s/p lumpectomy and rads. declined hormonal tx. last mammogram 2011  . Hyperlipidemia   . Hypertension   . OA (osteoarthritis) of knee 09/20/2014  . PAF (paroxysmal atrial fibrillation) ([place])   . Paroxysmal atrial fibrillation ([place]) 04/20/2014  . Pyelonephritis 12/09/2016  . SVT (supraventricular tachycardia) ([place])   . Vascular dementia with behavior disturbance 04/20/2014    Past Surgical History:  Procedure Laterality Date  . BREAST LUMPECTOMY Left 2009   left breast  . CESAREAN SECTION    . EYE SURGERY     cataracts x3    Allergies as of 11/13/2017   No Known Allergies     Medication List        Accurate as of  11/13/17 11:59 PM. Always use your most recent med list.          acetaminophen 325 MG tablet Commonly known as:  TYLENOL Take 650 mg by mouth every 6 (six) hours as needed. Do not exceed 3000 mg in 24 hours   ARTIFICIAL TEAR OP Apply to eye. Instill 1 drop in both eyes twice daily (wait 3-5 minutes between different eye drops)   atorvastatin 20 MG tablet Commonly known as:  LIPITOR Take 20 mg by mouth daily.   ELIQUIS 2.5 MG Tabs tablet Generic drug:  apixaban Take 2.5 mg by mouth 2 (two) times daily.   ferrous sulfate 325 (65 FE) MG tablet Take 325 mg by mouth daily. anemia   furosemide 40 MG tablet Commonly known as:  LASIX Take 40 mg by mouth daily.   ICAPS AREDS 2 Chew Chew 2 tablets by mouth daily.   LORazepam 0.5 MG tablet Commonly known as:  ATIVAN Take 0.5 mg by mouth 2 (two) times daily. Hold for sedation   memantine 10 MG tablet Commonly known as:  NAMENDA Take 10 mg by mouth 2 (two) times daily.   metoprolol tartrate 25 MG tablet Commonly known as:  LOPRESSOR Take 0.5 tablets (12.5 mg total) by mouth 3 (three) times daily.   NUTRITIONAL SUPPLEMENT PO Mechanical Soft with not theraputic restrictions.  Nectar Thick liquids.  Soda and tomato juice ok unthickened   senna 8.6 MG Tabs tablet Commonly known as:  [employer]  Take 1 tablet by mouth daily.   traZODone 50 MG tablet Commonly known as:  DESYREL Take 50 mg by mouth at bedtime.       No orders of the defined types were placed in this encounter.   Immunization History  Administered Date(s) Administered  . Influenza Inj Mdck Quad Pf 03/09/2012  . Influenza Split 04/20/2009, 05/12/2010  . Influenza, Seasonal, Injecte, Preservative Fre 03/21/2013  . Influenza-Unspecified 03/22/2015  . PPD Test 05/08/2014  . Pneumococcal Polysaccharide-23 05/12/2010  . Td 01/14/2008  . Zoster 01/14/2008    Social History   Tobacco Use  . Smoking status: Never Smoker  . Smokeless tobacco: Never Used    Substance Use Topics  . Alcohol use: No    Review of Systems to obtain secondary to dementia; nursing-no acute concerns     Vitals:   11/13/17 1116  BP: 134/66  Pulse: 71  Resp: 18  Temp: 98.2 F (36.8 C)   Body mass index is 32.7 kg/m. Physical Exam  GENERAL APPEARANCE: Alert, conversant, No acute distress  SKIN: No diaphoresis rash HEENT: Unremarkable RESPIRATORY: Breathing is even, unlabored. Lung sounds are clear   CARDIOVASCULAR: Heart RRR no murmurs, rubs or gallops. No peripheral edema  GASTROINTESTINAL: Abdomen is soft, non-tender, not distended w/ normal bowel sounds.  GENITOURINARY: Bladder non tender, not distended  MUSCULOSKELETAL: No abnormal joints or musculature NEUROLOGIC: Cranial nerves 2-12 grossly intact. Moves all extremities PSYCHIATRIC: Dementia, no behavioral issues that patient occasionally calls out  Patient Active Problem List   Diagnosis Date Noted  . Dyslipidemia 10/23/2017  . Klebsiella pneumoniae (k. pneumoniae) as the cause of diseases classified elsewhere 12/09/2016  . Pyelonephritis 12/09/2016  . Chronic diastolic CHF (congestive heart failure) ([place]) 12/02/2016  . Protein calorie malnutrition ([place]) 12/02/2016  . Macular degeneration 09/06/2016  . Depression 04/23/2016  . Poor appetite 10/11/2015  . Loss of weight 10/11/2015  . Diarrhea 09/10/2015  . Anxiety 07/04/2015  . Mood disorder ([place]) 07/04/2015  . DVT of lower extremity (deep venous thrombosis) ([place]) 01/23/2015  . OA (osteoarthritis) of knee 09/20/2014  . CKD (chronic kidney disease) stage 3, GFR 30-59 ml/min (HCC) 08/04/2014  . Anemia of chronic disease 08/04/2014  . Hyperlipidemia   . Essential hypertension 04/20/2014  . Vascular dementia with behavior disturbance 04/20/2014  . Paroxysmal atrial fibrillation ([place]) 04/20/2014  . Basal ganglia infarction ([place]) 04/17/2014  . History of breast cancer 08/23/2012    CMP     Component Value Date/Time   NA 141 08/03/2017    K 4.3 08/03/2017   CL 105 12/06/2016 0346   CO2 20 (L) 12/06/2016 0346   GLUCOSE 85 12/06/2016 0346   BUN 32 (A) 08/03/2017   CREATININE 1.1 08/03/2017   CREATININE 1.43 (H) 12/06/2016 0346   CALCIUM 7.6 (L) 12/06/2016 0346   PROT 6.3 (L) 12/02/2016 0556   ALBUMIN 2.3 (L) 12/02/2016 0556   AST 12 (A) 07/23/2017   ALT 9 07/23/2017   ALKPHOS 73 07/23/2017   BILITOT 0.9 12/02/2016 0556   GFRNONAA 31 (L) 12/06/2016 0346   GFRAA 36 (L) 12/06/2016 0346   Recent Labs    01/12/17 07/23/17 08/03/17  NA 143 143 141  K 4.4 4.6 4.3  BUN 22* 34* 32*  CREATININE 1.1 1.1 1.1   Recent Labs    01/12/17 07/23/17  AST 15 12*  ALT 10 9  ALKPHOS 66 73   Recent Labs    01/12/17 07/23/17  WBC 7.3 8.2  HGB 10.0* 11.4*  HCT  31* 36  PLT 220 200   Recent Labs    01/12/17 05/26/17 07/23/17  CHOL 97 97 110  LDLCALC 57 50 60  TRIG 102 73 68   No results found for: [facility] Lab Results  Component Value Date   TSH 4.22 07/23/2017   Lab Results  Component Value Date   HGBA1C 5.6 07/23/2017   Lab Results  Component Value Date   CHOL 110 07/23/2017   HDL 37 07/23/2017   LDLCALC 60 07/23/2017   TRIG 68 07/23/2017   CHOLHDL 5.7 04/18/2014    Significant Diagnostic Results in last 30 days:  No results found.  Assessment and Plan  Paroxysmal atrial fibrillation (HCC) Chronic and stable; continue metoprolol 12.5 mg 3 times daily and Eliquis 2.5 mg twice daily  OA (osteoarthritis) of knee Reports of problems or pain; continue 650 of Tylenol every 6 hours as needed  Macular degeneration Stable; continue ICaps areds chews p.o. twice daily    [name], MD

## 2017-12-06 NOTE — Assessment & Plan Note (Signed)
Chronic and stable; continue metoprolol 12.5 mg 3 times daily and Eliquis 2.5 mg twice daily

## 2017-12-07 ENCOUNTER — Encounter: Payer: Self-pay | Admitting: Internal Medicine

## 2017-12-07 ENCOUNTER — Non-Acute Institutional Stay (SKILLED_NURSING_FACILITY): Payer: Medicare Other | Admitting: Internal Medicine

## 2017-12-07 DIAGNOSIS — D638 Anemia in other chronic diseases classified elsewhere: Secondary | ICD-10-CM

## 2017-12-07 DIAGNOSIS — I10 Essential (primary) hypertension: Secondary | ICD-10-CM

## 2017-12-07 DIAGNOSIS — E7849 Other hyperlipidemia: Secondary | ICD-10-CM | POA: Diagnosis not present

## 2017-12-07 NOTE — Progress Notes (Signed)
Location:  [facility] Room Number: 217-D Place of Service:  SNF (31)  Hennie Duos, MD  Patient Care Team: Hennie Duos, MD as PCP - General (Internal Medicine)  Extended Emergency Contact Information Primary Emergency Contact: [facility] Address: [place], [place] [address] [name] of [place] Phone: [phone] Relation: Grandson    Allergies: Patient has no known allergies.  Chief Complaint  Patient presents with  . Medical Management of Chronic Issues    Routine Visit    HPI: Patient is 84 y.o. female who is being seen for routine issues of hyperlipidemia, hypertension, and iron deficiency anemia.  Past Medical History:  Diagnosis Date  . Anemia of chronic disease 08/04/2014  . Anxiety 07/04/2015  . Arthritis   . Basal ganglia infarction ([place]) 04/17/2014  . Chronic diastolic CHF (congestive heart failure) ([place]) 12/02/2016  . CKD (chronic kidney disease) stage 3, GFR 30-59 ml/min ([place]) 08/04/2014  . COPD (chronic obstructive pulmonary disease) ([place])   . Dementia 02/02/2012  . Depression 04/23/2016  . DVT of lower extremity (deep venous thrombosis) ([place]) 01/23/2015  . Essential hypertension 04/20/2014  . History of breast cancer 2009   Left breast  s/p lumpectomy and rads. declined hormonal tx. last mammogram 2011  . Hyperlipidemia   . Hypertension   . OA (osteoarthritis) of knee 09/20/2014  . PAF (paroxysmal atrial fibrillation) ([place])   . Paroxysmal atrial fibrillation ([place]) 04/20/2014  . Pyelonephritis 12/09/2016  . SVT (supraventricular tachycardia) ([place])   . Vascular dementia with behavior disturbance 04/20/2014    Past Surgical History:  Procedure Laterality Date  . BREAST LUMPECTOMY Left 2009   left breast  . CESAREAN SECTION    . EYE SURGERY     cataracts x3    Allergies as of 12/07/2017   No Known Allergies     Medication List        Accurate as of 12/07/17 11:59 PM. Always use your  most recent med list.          acetaminophen 325 MG tablet Commonly known as:  TYLENOL Take 650 mg by mouth every 6 (six) hours as needed. Do not exceed 3000 mg in 24 hours   ARTIFICIAL TEAR OP Apply to eye. Instill 1 drop in both eyes twice daily (wait 3-5 minutes between different eye drops)   atorvastatin 20 MG tablet Commonly known as:  LIPITOR Take 20 mg by mouth daily.   ELIQUIS 2.5 MG Tabs tablet Generic drug:  apixaban Take 2.5 mg by mouth 2 (two) times daily.   ferrous sulfate 325 (65 FE) MG tablet Take 325 mg by mouth daily. anemia   furosemide 40 MG tablet Commonly known as:  LASIX Take 40 mg by mouth daily.   ICAPS AREDS 2 Chew Chew 2 tablets by mouth daily.   LORazepam 0.5 MG tablet Commonly known as:  ATIVAN Take 0.5 mg by mouth 2 (two) times daily. Hold for sedation   memantine 10 MG tablet Commonly known as:  NAMENDA Take 10 mg by mouth 2 (two) times daily.   metoprolol tartrate 25 MG tablet Commonly known as:  LOPRESSOR Take 0.5 tablets (12.5 mg total) by mouth 3 (three) times daily.   NUTRITIONAL SUPPLEMENT PO Mechanical Soft with not theraputic restrictions.  Nectar Thick liquids.  Soda and tomato juice ok unthickened   senna 8.6 MG Tabs tablet Commonly known as:  SENOKOT Take 1 tablet by  mouth daily.   traZODone 50 MG tablet Commonly known as:  DESYREL Take 50 mg by mouth at bedtime.       No orders of the defined types were placed in this encounter.   Immunization History  Administered Date(s) Administered  . Influenza Inj Mdck Quad Pf 03/09/2012  . Influenza Split 04/20/2009, 05/12/2010  . Influenza, Seasonal, Injecte, Preservative Fre 03/21/2013  . Influenza-Unspecified 03/22/2015  . PPD Test 05/08/2014  . Pneumococcal Polysaccharide-23 05/12/2010  . Td 01/14/2008  . Zoster 01/14/2008    Social History   Tobacco Use  . Smoking status: Never Smoker  . Smokeless tobacco: Never Used  Substance Use Topics  . Alcohol use:  No    Review of Systems  DATA OBTAINED: from patient-very limited; nursing-no acute concerns GENERAL:  no fevers, fatigue, appetite changes SKIN: No itching, rash HEENT: No complaint RESPIRATORY: No cough, wheezing, SOB CARDIAC: No chest pain, palpitations, lower extremity edema  GI: No abdominal pain, No N/V/D or constipation, No heartburn or reflux  GU: No dysuria, frequency or urgency, or incontinence  MUSCULOSKELETAL: No unrelieved bone/joint pain NEUROLOGIC: No headache, dizziness  PSYCHIATRIC: No overt anxiety or sadness  Vitals:   12/07/17 1231  BP: (!) 130/56  Pulse: 84  Resp: 18  Temp: 98 F (36.7 C)   Body mass index is 32.7 kg/m. Physical Exam  GENERAL APPEARANCE: Alert, conversant, No acute distress  SKIN: No diaphoresis rash HEENT: Unremarkable RESPIRATORY: Breathing is even, unlabored. Lung sounds are clear   CARDIOVASCULAR: Heart RRR no murmurs, rubs or gallops. No peripheral edema  GASTROINTESTINAL: Abdomen is soft, non-tender, not distended w/ normal bowel sounds.  GENITOURINARY: Bladder non tender, not distended  MUSCULOSKELETAL: No abnormal joints or musculature NEUROLOGIC: Cranial nerves 2-12 grossly intact. Moves all extremities PSYCHIATRIC: Dementia, no behavioral issues  Patient Active Problem List   Diagnosis Date Noted  . Dyslipidemia 10/23/2017  . Klebsiella pneumoniae (k. pneumoniae) as the cause of diseases classified elsewhere 12/09/2016  . Pyelonephritis 12/09/2016  . Chronic diastolic CHF (congestive heart failure) ([place]) 12/02/2016  . Protein calorie malnutrition ([place]) 12/02/2016  . Macular degeneration 09/06/2016  . Depression 04/23/2016  . Poor appetite 10/11/2015  . Loss of weight 10/11/2015  . Diarrhea 09/10/2015  . Anxiety 07/04/2015  . Mood disorder ([place]) 07/04/2015  . DVT of lower extremity (deep venous thrombosis) ([place]) 01/23/2015  . OA (osteoarthritis) of knee 09/20/2014  . CKD (chronic kidney disease) stage 3, GFR 30-59  ml/min (HCC) 08/04/2014  . Anemia of chronic disease 08/04/2014  . Hyperlipidemia   . Essential hypertension 04/20/2014  . Vascular dementia with behavior disturbance 04/20/2014  . Paroxysmal atrial fibrillation ([place]) 04/20/2014  . Basal ganglia infarction ([place]) 04/17/2014  . History of breast cancer 08/23/2012    CMP     Component Value Date/Time   NA 141 08/03/2017   K 4.3 08/03/2017   CL 105 12/06/2016 0346   CO2 20 (L) 12/06/2016 0346   GLUCOSE 85 12/06/2016 0346   BUN 32 (A) 08/03/2017   CREATININE 1.1 08/03/2017   CREATININE 1.43 (H) 12/06/2016 0346   CALCIUM 7.6 (L) 12/06/2016 0346   PROT 6.3 (L) 12/02/2016 0556   ALBUMIN 2.3 (L) 12/02/2016 0556   AST 12 (A) 07/23/2017   ALT 9 07/23/2017   ALKPHOS 73 07/23/2017   BILITOT 0.9 12/02/2016 0556   GFRNONAA 31 (L) 12/06/2016 0346   GFRAA 36 (L) 12/06/2016 0346   Recent Labs    01/12/17 07/23/17 08/03/17  NA 143 143 141  K 4.4 4.6 4.3  BUN 22* 34* 32*  CREATININE 1.1 1.1 1.1   Recent Labs    01/12/17 07/23/17  AST 15 12*  ALT 10 9  ALKPHOS 66 73   Recent Labs    01/12/17 07/23/17  WBC 7.3 8.2  HGB 10.0* 11.4*  HCT 31* 36  PLT 220 200   Recent Labs    01/12/17 05/26/17 07/23/17  CHOL 97 97 110  LDLCALC 57 50 60  TRIG 102 73 68   No results found for: [facility] Lab Results  Component Value Date   TSH 4.22 07/23/2017   Lab Results  Component Value Date   HGBA1C 5.6 07/23/2017   Lab Results  Component Value Date   CHOL 110 07/23/2017   HDL 37 07/23/2017   LDLCALC 60 07/23/2017   TRIG 68 07/23/2017   CHOLHDL 5.7 04/18/2014    Significant Diagnostic Results in last 30 days:  No results found.  Assessment and Plan  Essential hypertension Control; continue metoprolol 12.5 mg 3 times daily and Lasix 40 mg daily  Hyperlipidemia Controlled; continue Lipitor 20 mg p.o. daily  Anemia of chronic disease Hemoglobin 11.4 which is improved from prior; continue iron 325 mg p.o. daily     Hennie Duos, MD

## 2017-12-17 DIAGNOSIS — F419 Anxiety disorder, unspecified: Secondary | ICD-10-CM | POA: Diagnosis not present

## 2017-12-17 DIAGNOSIS — F0390 Unspecified dementia without behavioral disturbance: Secondary | ICD-10-CM | POA: Diagnosis not present

## 2017-12-17 DIAGNOSIS — G4700 Insomnia, unspecified: Secondary | ICD-10-CM | POA: Diagnosis not present

## 2017-12-17 DIAGNOSIS — F39 Unspecified mood [affective] disorder: Secondary | ICD-10-CM | POA: Diagnosis not present

## 2017-12-21 ENCOUNTER — Non-Acute Institutional Stay (SKILLED_NURSING_FACILITY): Payer: Medicare Other

## 2017-12-21 DIAGNOSIS — Z0000 Encounter for general adult medical examination without abnormal findings: Secondary | ICD-10-CM | POA: Diagnosis not present

## 2017-12-21 NOTE — Patient Instructions (Signed)
Cindy Trevino , Thank you for taking time to come for your Medicare Wellness Visit. I appreciate your ongoing commitment to your health goals. Please review the following plan we discussed and let me know if I can assist you in the future.   Screening recommendations/referrals: Colonoscopy excluded, over age 82 Mammogram excluded, over age 26 Bone Density up to date Recommended yearly ophthalmology/optometry visit for glaucoma screening and checkup Recommended yearly dental visit for hygiene and checkup  Vaccinations: Influenza vaccine up to date, due 2019 fall season Pneumococcal vaccine exlcuded Tdap vaccine up to date, due 01/13/2018 Shingles vaccine not in past records    Advanced directives: in chart  Conditions/risks identified: none  Next appointment: Dr. Sheppard Coil makes rounds   Preventive Care 61 Years and Older, Female Preventive care refers to lifestyle choices and visits with your health care provider that can promote health and wellness. What does preventive care include?  A yearly physical exam. This is also called an annual well check.  Dental exams once or twice a year.  Routine eye exams. Ask your health care provider how often you should have your eyes checked.  Personal lifestyle choices, including:  Daily care of your teeth and gums.  Regular physical activity.  Eating a healthy diet.  Avoiding tobacco and drug use.  Limiting alcohol use.  Practicing safe sex.  Taking low-dose aspirin every day.  Taking vitamin and mineral supplements as recommended by your health care provider. What happens during an annual well check? The services and screenings done by your health care provider during your annual well check will depend on your age, overall health, lifestyle risk factors, and family history of disease. Counseling  Your health care provider may ask you questions about your:  Alcohol use.  Tobacco use.  Drug use.  Emotional  well-being.  Home and relationship well-being.  Sexual activity.  Eating habits.  History of falls.  Memory and ability to understand (cognition).  Work and work Statistician.  Reproductive health. Screening  You may have the following tests or measurements:  Height, weight, and BMI.  Blood pressure.  Lipid and cholesterol levels. These may be checked every 5 years, or more frequently if you are over 17 years old.  Skin check.  Lung cancer screening. You may have this screening every year starting at age 46 if you have a 30-pack-year history of smoking and currently smoke or have quit within the past 15 years.  Fecal occult blood test (FOBT) of the stool. You may have this test every year starting at age 10.  Flexible sigmoidoscopy or colonoscopy. You may have a sigmoidoscopy every 5 years or a colonoscopy every 10 years starting at age 106.  Hepatitis C blood test.  Hepatitis B blood test.  Sexually transmitted disease (STD) testing.  Diabetes screening. This is done by checking your blood sugar (glucose) after you have not eaten for a while (fasting). You may have this done every 1-3 years.  Bone density scan. This is done to screen for osteoporosis. You may have this done starting at age 14.  Mammogram. This may be done every 1-2 years. Talk to your health care provider about how often you should have regular mammograms. Talk with your health care provider about your test results, treatment options, and if necessary, the need for more tests. Vaccines  Your health care provider may recommend certain vaccines, such as:  Influenza vaccine. This is recommended every year.  Tetanus, diphtheria, and acellular pertussis (Tdap, Td) vaccine. You  may need a Td booster every 10 years.  Zoster vaccine. You may need this after age 27.  Pneumococcal 13-valent conjugate (PCV13) vaccine. One dose is recommended after age 30.  Pneumococcal polysaccharide (PPSV23) vaccine. One  dose is recommended after age 65. Talk to your health care provider about which screenings and vaccines you need and how often you need them. This information is not intended to replace advice given to you by your health care provider. Make sure you discuss any questions you have with your health care provider. Document Released: 07/13/2015 Document Revised: 03/05/2016 Document Reviewed: 04/17/2015 Elsevier Interactive Patient Education  2017 [place] Prevention in the Home Falls can cause injuries. They can happen to people of all ages. There are many things you can do to make your home safe and to help prevent falls. What can I do on the outside of my home?  Regularly fix the edges of walkways and driveways and fix any cracks.  Remove anything that might make you trip as you walk through a door, such as a raised step or threshold.  Trim any bushes or trees on the path to your home.  Use bright outdoor lighting.  Clear any walking paths of anything that might make someone trip, such as rocks or tools.  Regularly check to see if handrails are loose or broken. Make sure that both sides of any steps have handrails.  Any raised decks and porches should have guardrails on the edges.  Have any leaves, snow, or ice cleared regularly.  Use sand or salt on walking paths during winter.  Clean up any spills in your garage right away. This includes oil or grease spills. What can I do in the bathroom?  Use night lights.  Install grab bars by the toilet and in the tub and shower. Do not use towel bars as grab bars.  Use non-skid mats or decals in the tub or shower.  If you need to sit down in the shower, use a plastic, non-slip stool.  Keep the floor dry. Clean up any water that spills on the floor as soon as it happens.  Remove soap buildup in the tub or shower regularly.  Attach bath mats securely with double-sided non-slip rug tape.  Do not have throw rugs and other  things on the floor that can make you trip. What can I do in the bedroom?  Use night lights.  Make sure that you have a light by your bed that is easy to reach.  Do not use any sheets or blankets that are too big for your bed. They should not hang down onto the floor.  Have a firm chair that has side arms. You can use this for support while you get dressed.  Do not have throw rugs and other things on the floor that can make you trip. What can I do in the kitchen?  Clean up any spills right away.  Avoid walking on wet floors.  Keep items that you use a lot in easy-to-reach places.  If you need to reach something above you, use a strong step stool that has a grab bar.  Keep electrical cords out of the way.  Do not use floor polish or wax that makes floors slippery. If you must use wax, use non-skid floor wax.  Do not have throw rugs and other things on the floor that can make you trip. What can I do with my stairs?  Do not leave any items  on the stairs.  Make sure that there are handrails on both sides of the stairs and use them. Fix handrails that are broken or loose. Make sure that handrails are as long as the stairways.  Check any carpeting to make sure that it is firmly attached to the stairs. Fix any carpet that is loose or worn.  Avoid having throw rugs at the top or bottom of the stairs. If you do have throw rugs, attach them to the floor with carpet tape.  Make sure that you have a light switch at the top of the stairs and the bottom of the stairs. If you do not have them, ask someone to add them for you. What else can I do to help prevent falls?  Wear shoes that:  Do not have high heels.  Have rubber bottoms.  Are comfortable and fit you well.  Are closed at the toe. Do not wear sandals.  If you use a stepladder:  Make sure that it is fully opened. Do not climb a closed stepladder.  Make sure that both sides of the stepladder are locked into place.  Ask  someone to hold it for you, if possible.  Clearly mark and make sure that you can see:  Any grab bars or handrails.  First and last steps.  Where the edge of each step is.  Use tools that help you move around (mobility aids) if they are needed. These include:  Canes.  Walkers.  Scooters.  Crutches.  Turn on the lights when you go into a dark area. Replace any light bulbs as soon as they burn out.  Set up your furniture so you have a clear path. Avoid moving your furniture around.  If any of your floors are uneven, fix them.  If there are any pets around you, be aware of where they are.  Review your medicines with your doctor. Some medicines can make you feel dizzy. This can increase your chance of falling. Ask your doctor what other things that you can do to help prevent falls. This information is not intended to replace advice given to you by your health care provider. Make sure you discuss any questions you have with your health care provider. Document Released: 04/12/2009 Document Revised: 11/22/2015 Document Reviewed: 07/21/2014 Elsevier Interactive Patient Education  2017 [employer].

## 2017-12-21 NOTE — Progress Notes (Signed)
Subjective:   Cindy Trevino is a 84 y.o. female who presents for Medicare Annual (Subsequent) preventive examination at [facility]; incapacitated patient unable to answer questions appropriately   Last AWV-12/17/2016       Objective:     Vitals: BP 130/64 (BP Location: Right Arm, Patient Position: Sitting)   Pulse 65   Temp 98.3 F (36.8 C) (Oral)   Ht 5\' 3"  (1.6 m)   Wt 184 lb (83.5 kg)   LMP  (LMP Unknown)   SpO2 93%   BMI 32.59 kg/m   Body mass index is 32.59 kg/m.  Advanced Directives 12/21/2017 10/22/2017 09/01/2017 08/04/2017 07/07/2017 06/03/2017 05/20/2017  Does Patient Have a Medical Advance Directive? Yes Yes Yes Yes Yes Yes Yes  Type of Advance Directive Out of facility DNR (pink MOST or yellow form);Healthcare Power of [employer] of facility DNR (pink MOST or yellow form);Healthcare Power of [employer] of facility DNR (pink MOST or yellow form);Healthcare Power of [employer] of facility DNR (pink MOST or yellow form);Healthcare Power of [employer] of facility DNR (pink MOST or yellow form);Healthcare Power of [place];Out of facility DNR (pink MOST or yellow form) Out of facility DNR (pink MOST or yellow form);Healthcare Power of Attorney  Does patient want to make changes to medical advance directive? No - Patient declined No - Patient declined No - Patient declined No - Patient declined No - Patient declined - -  Copy of [place] in Chart? Yes Yes Yes Yes Yes Yes Yes  Would patient like information on creating a medical advance directive? - - - - - - -  Pre-existing out of facility DNR order (yellow form or pink MOST form) Yellow form placed in chart (order not valid for inpatient use) Yellow form placed in chart (order not valid for inpatient use) Yellow form placed in chart (order not valid for inpatient use) Yellow form placed in chart (order not valid for inpatient use) Yellow form placed in chart  (order not valid for inpatient use) Yellow form placed in chart (order not valid for inpatient use) Yellow form placed in chart (order not valid for inpatient use)    Tobacco Social History   Tobacco Use  Smoking Status Never Smoker  Smokeless Tobacco Never Used     Counseling given: Not Answered   Clinical Intake:  Pre-visit preparation completed: No  Pain : Faces Faces Pain Scale: No hurt  Faces Pain Scale: No hurt  Nutritional Risks: None Diabetes: No  How often do you need to have someone help you when you read instructions, pamphlets, or other written materials from your doctor or pharmacy?: 4 - Often(dementia)  Interpreter Needed?: No  Information entered by :: Tyson Dense, RN  Past Medical History:  Diagnosis Date  . Anemia of chronic disease 08/04/2014  . Anxiety 07/04/2015  . Arthritis   . Basal ganglia infarction ([place]) 04/17/2014  . Chronic diastolic CHF (congestive heart failure) ([place]) 12/02/2016  . CKD (chronic kidney disease) stage 3, GFR 30-59 ml/min ([place]) 08/04/2014  . COPD (chronic obstructive pulmonary disease) ([place])   . Dementia 02/02/2012  . Depression 04/23/2016  . DVT of lower extremity (deep venous thrombosis) ([place]) 01/23/2015  . Essential hypertension 04/20/2014  . History of breast cancer 2009   Left breast  s/p lumpectomy and rads. declined hormonal tx. last mammogram 2011  . Hyperlipidemia   . Hypertension   . OA (osteoarthritis) of knee 09/20/2014  .  PAF (paroxysmal atrial fibrillation) ([place])   . Paroxysmal atrial fibrillation ([place]) 04/20/2014  . Pyelonephritis 12/09/2016  . SVT (supraventricular tachycardia) ([place])   . Vascular dementia with behavior disturbance 04/20/2014   Past Surgical History:  Procedure Laterality Date  . BREAST LUMPECTOMY Left 2009   left breast  . CESAREAN SECTION    . EYE SURGERY     cataracts x3   Family History  Problem Relation Age of Onset  . Heart disease Mother   . Diabetes Mother   . Heart disease  Father   . Diabetes Sister   . Hypertension Sister    Social History   Socioeconomic History  . Marital status: Single    Spouse name: Not on file  . Number of children: Not on file  . Years of education: Not on file  . Highest education level: Not on file  Occupational History  . Occupation: retired Engineer, maintenance (IT)  . Financial resource strain: Not on file  . Food insecurity:    Worry: Not on file    Inability: Not on file  . Transportation needs:    Medical: Not on file    Non-medical: Not on file  Tobacco Use  . Smoking status: Never Smoker  . Smokeless tobacco: Never Used  Substance and Sexual Activity  . Alcohol use: No  . Drug use: No  . Sexual activity: Never  Lifestyle  . Physical activity:    Days per week: Not on file    Minutes per session: Not on file  . Stress: Not on file  Relationships  . Social connections:    Talks on phone: Not on file    Gets together: Not on file    Attends religious service: Not on file    Active member of club or organization: Not on file    Attends meetings of clubs or organizations: Not on file    Relationship status: Not on file  Other Topics Concern  . Not on file  Social History Narrative   Admitted to [facility] 04/21/14   Never married   Never smoked   Alcohol none   POA, DNR        Outpatient Encounter Medications as of 12/21/2017  Medication Sig  . acetaminophen (TYLENOL) 325 MG tablet Take 650 mg by mouth every 6 (six) hours as needed. Do not exceed 3000 mg in 24 hours  . apixaban (ELIQUIS) 2.5 MG TABS tablet Take 2.5 mg by mouth 2 (two) times daily.  . ARTIFICIAL TEAR OP Apply to eye. Instill 1 drop in both eyes twice daily (wait 3-5 minutes between different eye drops)  . atorvastatin (LIPITOR) 20 MG tablet Take 20 mg by mouth daily.   . ferrous sulfate 325 (65 FE) MG tablet Take 325 mg by mouth daily. anemia  . furosemide (LASIX) 40 MG tablet Take 40 mg by mouth daily.  Marland Kitchen LORazepam  (ATIVAN) 0.5 MG tablet Take 0.5 mg by mouth 2 (two) times daily. Hold for sedation  . memantine (NAMENDA) 10 MG tablet Take 10 mg by mouth 2 (two) times daily.  . metoprolol tartrate (LOPRESSOR) 25 MG tablet Take 0.5 tablets (12.5 mg total) by mouth 3 (three) times daily.  . Multiple Vitamins-Minerals (ICAPS AREDS 2) CHEW Chew 2 tablets by mouth daily.  . Nutritional Supplements (NUTRITIONAL SUPPLEMENT PO) Mechanical Soft with not theraputic restrictions.  Nectar Thick liquids.  Soda and tomato juice ok unthickened  . senna (SENOKOT) 8.6 MG TABS tablet  Take 1 tablet by mouth daily.  . traZODone (DESYREL) 50 MG tablet Take 50 mg by mouth at bedtime.   No facility-administered encounter medications on file as of 12/21/2017.     Activities of Daily Living In your present state of health, do you have any difficulty performing the following activities: 12/21/2017  Hearing? Y  Vision? Y  Comment macular degeneration  Difficulty concentrating or making decisions? Y  Walking or climbing stairs? Y  Dressing or bathing? Y  Doing errands, shopping? Y  Preparing Food and eating ? Y  Using the Toilet? Y  In the past six months, have you accidently leaked urine? Y  Do you have problems with loss of bowel control? Y  Managing your Medications? Y  Managing your Finances? Y  Housekeeping or managing your Housekeeping? Y  Some recent data might be hidden    Patient Care Team: Hennie Duos, MD as PCP - General (Internal Medicine)    Assessment:   This is a routine wellness examination for [name].  Exercise Activities and Dietary recommendations Current Exercise Habits: The patient does not participate in regular exercise at present, Exercise limited by: neurologic condition(s);orthopedic condition(s)  Goals    None      Fall Risk Fall Risk  12/21/2017 12/17/2016  Falls in the past year? No No   Is the patient's home free of loose throw rugs in walkways, pet beds, electrical cords,  etc?   yes      Grab bars in the bathroom? yes      Handrails on the stairs?   yes      Adequate lighting?   yes  Depression Screen PHQ 2/9 Scores 12/21/2017 12/17/2016  Exception Documentation Medical reason Medical reason     Cognitive Function MMSE - Mini Mental State Exam 12/21/2017  Not completed: Unable to complete     6CIT Screen 12/17/2016  What Year? 4 points  What month? 0 points  What time? 3 points  Count back from 20 4 points  Months in reverse 4 points  Repeat phrase 10 points  Total Score 25    Immunization History  Administered Date(s) Administered  . Influenza Inj Mdck Quad Pf 03/09/2012  . Influenza Split 04/20/2009, 05/12/2010  . Influenza, Seasonal, Injecte, Preservative Fre 03/21/2013  . Influenza-Unspecified 03/22/2015  . PPD Test 05/08/2014  . Pneumococcal Polysaccharide-23 05/12/2010  . Td 01/14/2008  . Zoster 01/14/2008    Qualifies for Shingles Vaccine? Not in past records  Screening Tests Health Maintenance  Topic Date Due  . TETANUS/TDAP  09/28/2023 (Originally 01/13/2018)  . INFLUENZA VACCINE  01/28/2018  . DEXA SCAN  Completed  . PNA vac Low Risk Adult  Discontinued    Cancer Screenings: Lung: Low Dose CT Chest recommended if Age 44-80 years, 30 pack-year currently smoking OR have quit w/in 15years. Patient does not qualify. Breast:  Up to date on Mammogram? Yes   Up to date of Bone Density/Dexa? Yes Colorectal: up to date  Additional Screenings:  Hepatitis C Screening: unable to accept or decline     Plan:    I have personally reviewed and addressed the Medicare Annual Wellness questionnaire and have noted the following in the patient's chart:  A. Medical and social history B. Use of alcohol, tobacco or illicit drugs  C. Current medications and supplements D. Functional ability and status E.  Nutritional status F.  Physical activity G. Advance directives H. List of other physicians I.  Hospitalizations, surgeries, and ER  visits  in previous 12 months J.  [place] to include hearing, vision, cognitive, depression L. Referrals and appointments - none  In addition, I am unable to review and discuss with incapacitated patient certain preventive protocols, quality metrics, and best practice recommendations. A written personalized care plan for preventive services as well as general preventive health recommendations were provided to patient.   See attached scanned questionnaire for additional information.   Signed,   Tyson Dense, RN Nurse Health Advisor  Patient Concerns: None

## 2017-12-31 ENCOUNTER — Encounter: Payer: Self-pay | Admitting: Internal Medicine

## 2017-12-31 NOTE — Assessment & Plan Note (Signed)
Controlled; continue Lipitor 20 mg p.o. daily

## 2017-12-31 NOTE — Assessment & Plan Note (Signed)
Control; continue metoprolol 12.5 mg 3 times daily and Lasix 40 mg daily

## 2017-12-31 NOTE — Assessment & Plan Note (Signed)
Hemoglobin 11.4 which is improved from prior; continue iron 325 mg p.o. daily

## 2018-01-04 ENCOUNTER — Encounter: Payer: Self-pay | Admitting: Internal Medicine

## 2018-01-04 ENCOUNTER — Non-Acute Institutional Stay (SKILLED_NURSING_FACILITY): Payer: Medicare Other | Admitting: Internal Medicine

## 2018-01-04 DIAGNOSIS — F329 Major depressive disorder, single episode, unspecified: Secondary | ICD-10-CM

## 2018-01-04 DIAGNOSIS — F32A Depression, unspecified: Secondary | ICD-10-CM

## 2018-01-04 DIAGNOSIS — F419 Anxiety disorder, unspecified: Secondary | ICD-10-CM

## 2018-01-04 DIAGNOSIS — I5032 Chronic diastolic (congestive) heart failure: Secondary | ICD-10-CM

## 2018-01-04 NOTE — Progress Notes (Signed)
Location:  [facility] Room Number: 2026/08/06 Place of Service:  SNF (31)  Hennie Duos, MD  Patient Care Team: Hennie Duos, MD as PCP - General (Internal Medicine)  Extended Emergency Contact Information Primary Emergency Contact: [facility] Address: [place], [place] [address] [name] of [place] Phone: [phone] Relation: Grandson    Allergies: Patient has no known allergies.  Chief Complaint  Patient presents with  . Medical Management of Chronic Issues    Routine Visit    HPI: Patient is 84 y.o. female who who is being seen for chronic diastolic congestive heart failure, depression, and anxiety.  Past Medical History:  Diagnosis Date  . Anemia of chronic disease 08/04/2014  . Anxiety 07/04/2015  . Arthritis   . Basal ganglia infarction ([place]) 04/17/2014  . Chronic diastolic CHF (congestive heart failure) ([place]) 12/02/2016  . CKD (chronic kidney disease) stage 3, GFR 30-59 ml/min ([place]) 08/04/2014  . COPD (chronic obstructive pulmonary disease) ([place])   . Dementia 02/02/2012  . Depression 04/23/2016  . DVT of lower extremity (deep venous thrombosis) ([place]) 01/23/2015  . Essential hypertension 04/20/2014  . History of breast cancer 2009   Left breast  s/p lumpectomy and rads. declined hormonal tx. last mammogram 2011  . Hyperlipidemia   . Hypertension   . OA (osteoarthritis) of knee 09/20/2014  . PAF (paroxysmal atrial fibrillation) ([place])   . Paroxysmal atrial fibrillation ([place]) 04/20/2014  . Pyelonephritis 12/09/2016  . SVT (supraventricular tachycardia) ([place])   . Vascular dementia with behavior disturbance 04/20/2014    Past Surgical History:  Procedure Laterality Date  . BREAST LUMPECTOMY Left 2009   left breast  . CESAREAN SECTION    . EYE SURGERY     cataracts x3    Allergies as of 01/04/2018   No Known Allergies     Medication List        Accurate as of 01/04/18 11:59 PM. Always use your most  recent med list.          acetaminophen 325 MG tablet Commonly known as:  TYLENOL Take 650 mg by mouth every 6 (six) hours as needed. Do not exceed 3000 mg in 24 hours   ARTIFICIAL TEAR OP Apply to eye. Instill 1 drop in both eyes twice daily (wait 3-5 minutes between different eye drops)   atorvastatin 20 MG tablet Commonly known as:  LIPITOR Take 20 mg by mouth daily.   ELIQUIS 2.5 MG Tabs tablet Generic drug:  apixaban Take 2.5 mg by mouth 2 (two) times daily.   ferrous sulfate 325 (65 FE) MG tablet Take 325 mg by mouth daily. anemia   furosemide 40 MG tablet Commonly known as:  LASIX Take 40 mg by mouth daily.   ICAPS AREDS 2 Chew Chew 2 tablets by mouth daily.   LORazepam 0.5 MG tablet Commonly known as:  ATIVAN Take 0.5 mg by mouth 2 (two) times daily. Hold for sedation   memantine 10 MG tablet Commonly known as:  NAMENDA Take 10 mg by mouth 2 (two) times daily.   metoprolol tartrate 25 MG tablet Commonly known as:  LOPRESSOR Take 0.5 tablets (12.5 mg total) by mouth 3 (three) times daily.   NUTRITIONAL SUPPLEMENT PO Mechanical Soft with not theraputic restrictions.  Nectar Thick liquids.  Soda and tomato juice ok unthickened   senna 8.6 MG Tabs tablet Commonly known as:  SENOKOT Take 1 tablet by mouth  daily.   traZODone 50 MG tablet Commonly known as:  DESYREL Take 50 mg by mouth at bedtime.       No orders of the defined types were placed in this encounter.   Immunization History  Administered Date(s) Administered  . Influenza Inj Mdck Quad Pf 03/09/2012  . Influenza Split 04/20/2009, 05/12/2010  . Influenza, Seasonal, Injecte, Preservative Fre 03/21/2013  . Influenza-Unspecified 03/22/2015  . PPD Test 05/08/2014  . Pneumococcal Polysaccharide-23 05/12/2010  . Td 01/14/2008  . Zoster 01/14/2008    Social History   Tobacco Use  . Smoking status: Never Smoker  . Smokeless tobacco: Never Used  Substance Use Topics  . Alcohol use: No     Review of Systems unable to obtain secondary to dementia; nursing-no acute concerns    Vitals:   01/04/18 1510  BP: 132/75  Pulse: 68  Resp: 18  Temp: (!) 97 F (36.1 C)   Body mass index is 33.44 kg/m. Physical Exam  GENERAL APPEARANCE: Alert, conversant, No acute distress  SKIN: No diaphoresis rash HEENT: Unremarkable RESPIRATORY: Breathing is even, unlabored. Lung sounds are clear   CARDIOVASCULAR: Heart RRR no murmurs, rubs or gallops. No peripheral edema  GASTROINTESTINAL: Abdomen is soft, non-tender, not distended w/ normal bowel sounds.  GENITOURINARY: Bladder non tender, not distended  MUSCULOSKELETAL: No abnormal joints or musculature NEUROLOGIC: Cranial nerves 2-12 grossly intact. Moves all extremities PSYCHIATRIC: Dementia, no behavioral issues  Patient Active Problem List   Diagnosis Date Noted  . Dyslipidemia 10/23/2017  . Klebsiella pneumoniae (k. pneumoniae) as the cause of diseases classified elsewhere 12/09/2016  . Pyelonephritis 12/09/2016  . Chronic diastolic CHF (congestive heart failure) ([place]) 12/02/2016  . Protein calorie malnutrition ([place]) 12/02/2016  . Macular degeneration 09/06/2016  . Depression 04/23/2016  . Poor appetite 10/11/2015  . Loss of weight 10/11/2015  . Diarrhea 09/10/2015  . Anxiety 07/04/2015  . Mood disorder ([place]) 07/04/2015  . DVT of lower extremity (deep venous thrombosis) ([place]) 01/23/2015  . OA (osteoarthritis) of knee 09/20/2014  . CKD (chronic kidney disease) stage 3, GFR 30-59 ml/min ([facility]) 08/04/2014  . Anemia of chronic disease 08/04/2014  . Hyperlipidemia   . Essential hypertension 04/20/2014  . Vascular dementia with behavior disturbance 04/20/2014  . Paroxysmal atrial fibrillation ([place]) 04/20/2014  . Basal ganglia infarction ([place]) 04/17/2014  . History of breast cancer 08/23/2012    CMP     Component Value Date/Time   NA 141 08/03/2017   K 4.3 08/03/2017   CL 105 12/06/2016 0346   CO2 20 (L) 12/06/2016  0346   GLUCOSE 85 12/06/2016 0346   BUN 32 (A) 08/03/2017   CREATININE 1.1 08/03/2017   CREATININE 1.43 (H) 12/06/2016 0346   CALCIUM 7.6 (L) 12/06/2016 0346   PROT 6.3 (L) 12/02/2016 0556   ALBUMIN 2.3 (L) 12/02/2016 0556   AST 12 (A) 07/23/2017   ALT 9 07/23/2017   ALKPHOS 73 07/23/2017   BILITOT 0.9 12/02/2016 0556   GFRNONAA 31 (L) 12/06/2016 0346   GFRAA 36 (L) 12/06/2016 0346   Recent Labs    07/23/17 08/03/17  NA 143 141  K 4.6 4.3  BUN 34* 32*  CREATININE 1.1 1.1   Recent Labs    07/23/17  AST 12*  ALT 9  ALKPHOS 73   Recent Labs    07/23/17  WBC 8.2  HGB 11.4*  HCT 36  PLT 200   Recent Labs    05/26/17 07/23/17  CHOL 97 110  LDLCALC 50 60  TRIG 73 68   No results found for: [facility] Lab Results  Component Value Date   TSH 4.22 07/23/2017   Lab Results  Component Value Date   HGBA1C 5.6 07/23/2017   Lab Results  Component Value Date   CHOL 110 07/23/2017   HDL 37 07/23/2017   LDLCALC 60 07/23/2017   TRIG 68 07/23/2017   CHOLHDL 5.7 04/18/2014    Significant Diagnostic Results in last 30 days:  No results found.  Assessment and Plan  Chronic diastolic CHF (congestive heart failure) ([facility]) No exacerbations; continue Lasix 40 mg daily and metoprolol 12.5 mg 3 times daily  Depression Stable; continue trazodone 50 mg nightly  Anxiety Controlled; continue Ativan 0.5 mg twice daily    Cayden Granholm D. Sheppard Coil, MD

## 2018-01-13 ENCOUNTER — Encounter: Payer: Self-pay | Admitting: Internal Medicine

## 2018-01-16 ENCOUNTER — Encounter: Payer: Self-pay | Admitting: Internal Medicine

## 2018-01-16 NOTE — Assessment & Plan Note (Signed)
Controlled; continue Ativan 0.5 mg twice daily

## 2018-01-16 NOTE — Assessment & Plan Note (Signed)
No exacerbations; continue Lasix 40 mg daily and metoprolol 12.5 mg 3 times daily

## 2018-01-16 NOTE — Assessment & Plan Note (Signed)
Stable; continue trazodone 50 mg nightly 

## 2018-01-25 DIAGNOSIS — H3530 Unspecified macular degeneration: Secondary | ICD-10-CM | POA: Diagnosis not present

## 2018-01-25 DIAGNOSIS — I69354 Hemiplegia and hemiparesis following cerebral infarction affecting left non-dominant side: Secondary | ICD-10-CM | POA: Diagnosis not present

## 2018-01-25 DIAGNOSIS — R1312 Dysphagia, oropharyngeal phase: Secondary | ICD-10-CM | POA: Diagnosis not present

## 2018-01-25 DIAGNOSIS — R278 Other lack of coordination: Secondary | ICD-10-CM | POA: Diagnosis not present

## 2018-01-25 DIAGNOSIS — F0150 Vascular dementia without behavioral disturbance: Secondary | ICD-10-CM | POA: Diagnosis not present

## 2018-01-25 DIAGNOSIS — M6281 Muscle weakness (generalized): Secondary | ICD-10-CM | POA: Diagnosis not present

## 2018-01-26 DIAGNOSIS — R1312 Dysphagia, oropharyngeal phase: Secondary | ICD-10-CM | POA: Diagnosis not present

## 2018-01-26 DIAGNOSIS — M6281 Muscle weakness (generalized): Secondary | ICD-10-CM | POA: Diagnosis not present

## 2018-01-26 DIAGNOSIS — H3530 Unspecified macular degeneration: Secondary | ICD-10-CM | POA: Diagnosis not present

## 2018-01-26 DIAGNOSIS — R278 Other lack of coordination: Secondary | ICD-10-CM | POA: Diagnosis not present

## 2018-01-26 DIAGNOSIS — F0150 Vascular dementia without behavioral disturbance: Secondary | ICD-10-CM | POA: Diagnosis not present

## 2018-01-26 DIAGNOSIS — I69354 Hemiplegia and hemiparesis following cerebral infarction affecting left non-dominant side: Secondary | ICD-10-CM | POA: Diagnosis not present

## 2018-01-27 DIAGNOSIS — R278 Other lack of coordination: Secondary | ICD-10-CM | POA: Diagnosis not present

## 2018-01-27 DIAGNOSIS — M6281 Muscle weakness (generalized): Secondary | ICD-10-CM | POA: Diagnosis not present

## 2018-01-27 DIAGNOSIS — I69354 Hemiplegia and hemiparesis following cerebral infarction affecting left non-dominant side: Secondary | ICD-10-CM | POA: Diagnosis not present

## 2018-01-27 DIAGNOSIS — F0150 Vascular dementia without behavioral disturbance: Secondary | ICD-10-CM | POA: Diagnosis not present

## 2018-01-27 DIAGNOSIS — R1312 Dysphagia, oropharyngeal phase: Secondary | ICD-10-CM | POA: Diagnosis not present

## 2018-01-27 DIAGNOSIS — H3530 Unspecified macular degeneration: Secondary | ICD-10-CM | POA: Diagnosis not present

## 2018-01-28 DIAGNOSIS — R1312 Dysphagia, oropharyngeal phase: Secondary | ICD-10-CM | POA: Diagnosis not present

## 2018-01-28 DIAGNOSIS — I69354 Hemiplegia and hemiparesis following cerebral infarction affecting left non-dominant side: Secondary | ICD-10-CM | POA: Diagnosis not present

## 2018-01-28 DIAGNOSIS — H3530 Unspecified macular degeneration: Secondary | ICD-10-CM | POA: Diagnosis not present

## 2018-01-28 DIAGNOSIS — M6281 Muscle weakness (generalized): Secondary | ICD-10-CM | POA: Diagnosis not present

## 2018-01-28 DIAGNOSIS — F0150 Vascular dementia without behavioral disturbance: Secondary | ICD-10-CM | POA: Diagnosis not present

## 2018-01-28 DIAGNOSIS — R278 Other lack of coordination: Secondary | ICD-10-CM | POA: Diagnosis not present

## 2018-01-29 DIAGNOSIS — H3530 Unspecified macular degeneration: Secondary | ICD-10-CM | POA: Diagnosis not present

## 2018-01-29 DIAGNOSIS — F0150 Vascular dementia without behavioral disturbance: Secondary | ICD-10-CM | POA: Diagnosis not present

## 2018-01-29 DIAGNOSIS — R278 Other lack of coordination: Secondary | ICD-10-CM | POA: Diagnosis not present

## 2018-01-29 DIAGNOSIS — M6281 Muscle weakness (generalized): Secondary | ICD-10-CM | POA: Diagnosis not present

## 2018-01-29 DIAGNOSIS — I69354 Hemiplegia and hemiparesis following cerebral infarction affecting left non-dominant side: Secondary | ICD-10-CM | POA: Diagnosis not present

## 2018-01-29 DIAGNOSIS — R1312 Dysphagia, oropharyngeal phase: Secondary | ICD-10-CM | POA: Diagnosis not present

## 2018-02-01 DIAGNOSIS — M6281 Muscle weakness (generalized): Secondary | ICD-10-CM | POA: Diagnosis not present

## 2018-02-01 DIAGNOSIS — F0150 Vascular dementia without behavioral disturbance: Secondary | ICD-10-CM | POA: Diagnosis not present

## 2018-02-01 DIAGNOSIS — R278 Other lack of coordination: Secondary | ICD-10-CM | POA: Diagnosis not present

## 2018-02-01 DIAGNOSIS — R1312 Dysphagia, oropharyngeal phase: Secondary | ICD-10-CM | POA: Diagnosis not present

## 2018-02-01 DIAGNOSIS — H3530 Unspecified macular degeneration: Secondary | ICD-10-CM | POA: Diagnosis not present

## 2018-02-01 DIAGNOSIS — I69354 Hemiplegia and hemiparesis following cerebral infarction affecting left non-dominant side: Secondary | ICD-10-CM | POA: Diagnosis not present

## 2018-02-02 DIAGNOSIS — R278 Other lack of coordination: Secondary | ICD-10-CM | POA: Diagnosis not present

## 2018-02-02 DIAGNOSIS — H3530 Unspecified macular degeneration: Secondary | ICD-10-CM | POA: Diagnosis not present

## 2018-02-02 DIAGNOSIS — I69354 Hemiplegia and hemiparesis following cerebral infarction affecting left non-dominant side: Secondary | ICD-10-CM | POA: Diagnosis not present

## 2018-02-02 DIAGNOSIS — F0150 Vascular dementia without behavioral disturbance: Secondary | ICD-10-CM | POA: Diagnosis not present

## 2018-02-02 DIAGNOSIS — R1312 Dysphagia, oropharyngeal phase: Secondary | ICD-10-CM | POA: Diagnosis not present

## 2018-02-02 DIAGNOSIS — M6281 Muscle weakness (generalized): Secondary | ICD-10-CM | POA: Diagnosis not present

## 2018-02-03 DIAGNOSIS — F0150 Vascular dementia without behavioral disturbance: Secondary | ICD-10-CM | POA: Diagnosis not present

## 2018-02-03 DIAGNOSIS — R1312 Dysphagia, oropharyngeal phase: Secondary | ICD-10-CM | POA: Diagnosis not present

## 2018-02-03 DIAGNOSIS — M6281 Muscle weakness (generalized): Secondary | ICD-10-CM | POA: Diagnosis not present

## 2018-02-03 DIAGNOSIS — R278 Other lack of coordination: Secondary | ICD-10-CM | POA: Diagnosis not present

## 2018-02-03 DIAGNOSIS — H3530 Unspecified macular degeneration: Secondary | ICD-10-CM | POA: Diagnosis not present

## 2018-02-03 DIAGNOSIS — I69354 Hemiplegia and hemiparesis following cerebral infarction affecting left non-dominant side: Secondary | ICD-10-CM | POA: Diagnosis not present

## 2018-02-04 DIAGNOSIS — R278 Other lack of coordination: Secondary | ICD-10-CM | POA: Diagnosis not present

## 2018-02-04 DIAGNOSIS — I69354 Hemiplegia and hemiparesis following cerebral infarction affecting left non-dominant side: Secondary | ICD-10-CM | POA: Diagnosis not present

## 2018-02-04 DIAGNOSIS — H3530 Unspecified macular degeneration: Secondary | ICD-10-CM | POA: Diagnosis not present

## 2018-02-04 DIAGNOSIS — F0150 Vascular dementia without behavioral disturbance: Secondary | ICD-10-CM | POA: Diagnosis not present

## 2018-02-04 DIAGNOSIS — M6281 Muscle weakness (generalized): Secondary | ICD-10-CM | POA: Diagnosis not present

## 2018-02-04 DIAGNOSIS — R1312 Dysphagia, oropharyngeal phase: Secondary | ICD-10-CM | POA: Diagnosis not present

## 2018-02-05 DIAGNOSIS — H3530 Unspecified macular degeneration: Secondary | ICD-10-CM | POA: Diagnosis not present

## 2018-02-05 DIAGNOSIS — I69354 Hemiplegia and hemiparesis following cerebral infarction affecting left non-dominant side: Secondary | ICD-10-CM | POA: Diagnosis not present

## 2018-02-05 DIAGNOSIS — R278 Other lack of coordination: Secondary | ICD-10-CM | POA: Diagnosis not present

## 2018-02-05 DIAGNOSIS — M6281 Muscle weakness (generalized): Secondary | ICD-10-CM | POA: Diagnosis not present

## 2018-02-05 DIAGNOSIS — F0150 Vascular dementia without behavioral disturbance: Secondary | ICD-10-CM | POA: Diagnosis not present

## 2018-02-05 DIAGNOSIS — R1312 Dysphagia, oropharyngeal phase: Secondary | ICD-10-CM | POA: Diagnosis not present

## 2018-02-07 DIAGNOSIS — M6281 Muscle weakness (generalized): Secondary | ICD-10-CM | POA: Diagnosis not present

## 2018-02-07 DIAGNOSIS — H3530 Unspecified macular degeneration: Secondary | ICD-10-CM | POA: Diagnosis not present

## 2018-02-07 DIAGNOSIS — I69354 Hemiplegia and hemiparesis following cerebral infarction affecting left non-dominant side: Secondary | ICD-10-CM | POA: Diagnosis not present

## 2018-02-07 DIAGNOSIS — R1312 Dysphagia, oropharyngeal phase: Secondary | ICD-10-CM | POA: Diagnosis not present

## 2018-02-07 DIAGNOSIS — R278 Other lack of coordination: Secondary | ICD-10-CM | POA: Diagnosis not present

## 2018-02-07 DIAGNOSIS — F0150 Vascular dementia without behavioral disturbance: Secondary | ICD-10-CM | POA: Diagnosis not present

## 2018-02-08 DIAGNOSIS — M6281 Muscle weakness (generalized): Secondary | ICD-10-CM | POA: Diagnosis not present

## 2018-02-08 DIAGNOSIS — R278 Other lack of coordination: Secondary | ICD-10-CM | POA: Diagnosis not present

## 2018-02-08 DIAGNOSIS — R1312 Dysphagia, oropharyngeal phase: Secondary | ICD-10-CM | POA: Diagnosis not present

## 2018-02-08 DIAGNOSIS — H3530 Unspecified macular degeneration: Secondary | ICD-10-CM | POA: Diagnosis not present

## 2018-02-08 DIAGNOSIS — I69354 Hemiplegia and hemiparesis following cerebral infarction affecting left non-dominant side: Secondary | ICD-10-CM | POA: Diagnosis not present

## 2018-02-08 DIAGNOSIS — F0150 Vascular dementia without behavioral disturbance: Secondary | ICD-10-CM | POA: Diagnosis not present

## 2018-02-09 ENCOUNTER — Non-Acute Institutional Stay (SKILLED_NURSING_FACILITY): Payer: Medicare Other | Admitting: Internal Medicine

## 2018-02-09 ENCOUNTER — Encounter: Payer: Self-pay | Admitting: Internal Medicine

## 2018-02-09 DIAGNOSIS — I10 Essential (primary) hypertension: Secondary | ICD-10-CM

## 2018-02-09 DIAGNOSIS — F0151 Vascular dementia with behavioral disturbance: Secondary | ICD-10-CM | POA: Diagnosis not present

## 2018-02-09 DIAGNOSIS — R1312 Dysphagia, oropharyngeal phase: Secondary | ICD-10-CM | POA: Diagnosis not present

## 2018-02-09 DIAGNOSIS — I480 Paroxysmal atrial fibrillation: Secondary | ICD-10-CM

## 2018-02-09 DIAGNOSIS — I69354 Hemiplegia and hemiparesis following cerebral infarction affecting left non-dominant side: Secondary | ICD-10-CM | POA: Diagnosis not present

## 2018-02-09 DIAGNOSIS — H3530 Unspecified macular degeneration: Secondary | ICD-10-CM | POA: Diagnosis not present

## 2018-02-09 DIAGNOSIS — F0150 Vascular dementia without behavioral disturbance: Secondary | ICD-10-CM | POA: Diagnosis not present

## 2018-02-09 DIAGNOSIS — F01518 Vascular dementia, unspecified severity, with other behavioral disturbance: Secondary | ICD-10-CM

## 2018-02-09 DIAGNOSIS — R278 Other lack of coordination: Secondary | ICD-10-CM | POA: Diagnosis not present

## 2018-02-09 DIAGNOSIS — M6281 Muscle weakness (generalized): Secondary | ICD-10-CM | POA: Diagnosis not present

## 2018-02-09 NOTE — Progress Notes (Signed)
Location:  [facility] Room Number: 217D Place of Service:  SNF [phone])  Noah Delaine. Sheppard Coil, MD  Patient Care Team: Hennie Duos, MD as PCP - General (Internal Medicine)  Extended Emergency Contact Information Primary Emergency Contact: [facility] Address: [place],  [address] [name] of [place] Phone: [phone] Relation: Grandson    Allergies: Patient has no known allergies.  Chief Complaint  Patient presents with  . Medical Management of Chronic Issues    Routine Visit    HPI: Patient is 84 y.o. female who is being seen for routine issues of hypertension, dementia with behaviors and paroxysmal atrial fibrillation.  Past Medical History:  Diagnosis Date  . Anemia of chronic disease 08/04/2014  . Anxiety 07/04/2015  . Arthritis   . Basal ganglia infarction ([place]) 04/17/2014  . Chronic diastolic CHF (congestive heart failure) ([place]) 12/02/2016  . CKD (chronic kidney disease) stage 3, GFR 30-59 ml/min ([place]) 08/04/2014  . COPD (chronic obstructive pulmonary disease) ([place])   . Dementia 02/02/2012  . Depression 04/23/2016  . DVT of lower extremity (deep venous thrombosis) ([place]) 01/23/2015  . Essential hypertension 04/20/2014  . History of breast cancer 2009   Left breast  s/p lumpectomy and rads. declined hormonal tx. last mammogram 2011  . Hyperlipidemia   . Hypertension   . OA (osteoarthritis) of knee 09/20/2014  . PAF (paroxysmal atrial fibrillation) ([place])   . Paroxysmal atrial fibrillation ([place]) 04/20/2014  . Pyelonephritis 12/09/2016  . SVT (supraventricular tachycardia) ([place])   . Vascular dementia with behavior disturbance 04/20/2014    Past Surgical History:  Procedure Laterality Date  . BREAST LUMPECTOMY Left 2009   left breast  . CESAREAN SECTION    . EYE SURGERY     cataracts x3    Allergies as of 02/09/2018   No Known Allergies     Medication List        Accurate as of 02/09/18 11:59 PM.  Always use your most recent med list.          acetaminophen 325 MG tablet Commonly known as:  TYLENOL Take 650 mg by mouth every 6 (six) hours as needed. Do not exceed 3000 mg in 24 hours   ARTIFICIAL TEAR OP Apply to eye. Instill 1 drop in both eyes twice daily (wait 3-5 minutes between different eye drops)   atorvastatin 20 MG tablet Commonly known as:  LIPITOR Take 20 mg by mouth daily.   ELIQUIS 2.5 MG Tabs tablet Generic drug:  apixaban Take 2.5 mg by mouth 2 (two) times daily.   ferrous sulfate 325 (65 FE) MG tablet Take 325 mg by mouth daily. anemia   furosemide 40 MG tablet Commonly known as:  LASIX Take 40 mg by mouth daily.   ICAPS AREDS 2 Chew Chew 2 tablets by mouth daily.   LORazepam 0.5 MG tablet Commonly known as:  ATIVAN Take 0.5 mg by mouth 2 (two) times daily. Hold for sedation   memantine 10 MG tablet Commonly known as:  NAMENDA Take 10 mg by mouth 2 (two) times daily.   metoprolol tartrate 25 MG tablet Commonly known as:  LOPRESSOR Take 0.5 tablets (12.5 mg total) by mouth 3 (three) times daily.   NUTRITIONAL SUPPLEMENT PO Mechanical Soft with not theraputic restrictions.  Nectar Thick liquids.  Soda and tomato juice ok unthickened   senna 8.6 MG Tabs tablet Commonly known as:  SENOKOT Take 1 tablet  by mouth daily.   traZODone 50 MG tablet Commonly known as:  DESYREL Take 50 mg by mouth at bedtime.       No orders of the defined types were placed in this encounter.   Immunization History  Administered Date(s) Administered  . Influenza Inj Mdck Quad Pf 03/09/2012  . Influenza Split 04/20/2009, 05/12/2010  . Influenza, Seasonal, Injecte, Preservative Fre 03/21/2013  . Influenza-Unspecified 03/22/2015  . PPD Test 05/08/2014  . Pneumococcal Polysaccharide-23 05/12/2010  . Td 01/14/2008  . Zoster 01/14/2008    Social History   Tobacco Use  . Smoking status: Never Smoker  . Smokeless tobacco: Never Used  Substance Use Topics    . Alcohol use: No    Review of Systems  DATA OBTAINED: from patient-extremely limited; nursing-no acute concerns GENERAL:  no fevers, fatigue, appetite changes SKIN: No itching, rash HEENT: No complaint RESPIRATORY: No cough, wheezing, SOB CARDIAC: No chest pain, palpitations, lower extremity edema  GI: No abdominal pain, No N/V/D or constipation, No heartburn or reflux  GU: No dysuria, frequency or urgency, or incontinence  MUSCULOSKELETAL: No unrelieved bone/joint pain NEUROLOGIC: No headache, dizziness  PSYCHIATRIC: No overt anxiety or sadness  Vitals:   02/09/18 1232  BP: 128/66  Pulse: 74  Resp: 18  Temp: 97.9 F (36.6 C)   Body mass index is 31.89 kg/m. Physical Exam  GENERAL APPEARANCE: Alert, minimally conversant, No acute distress  SKIN: No diaphoresis rash HEENT: Unremarkable RESPIRATORY: Breathing is even, unlabored. Lung sounds are clear   CARDIOVASCULAR: Heart RRR no murmurs, rubs or gallops. No peripheral edema  GASTROINTESTINAL: Abdomen is soft, non-tender, not distended w/ normal bowel sounds.  GENITOURINARY: Bladder non tender, not distended  MUSCULOSKELETAL: No abnormal joints or musculature NEUROLOGIC: Cranial nerves 2-12 grossly intact. Moves all extremities PSYCHIATRIC: Dementia, no behavioral issues  Patient Active Problem List   Diagnosis Date Noted  . Dyslipidemia 10/23/2017  . Klebsiella pneumoniae (k. pneumoniae) as the cause of diseases classified elsewhere 12/09/2016  . Pyelonephritis 12/09/2016  . Chronic diastolic CHF (congestive heart failure) ([place]) 12/02/2016  . Protein calorie malnutrition ([place]) 12/02/2016  . Macular degeneration 09/06/2016  . Depression 04/23/2016  . Poor appetite 10/11/2015  . Loss of weight 10/11/2015  . Diarrhea 09/10/2015  . Anxiety 07/04/2015  . Mood disorder ([place]) 07/04/2015  . DVT of lower extremity (deep venous thrombosis) ([place]) 01/23/2015  . OA (osteoarthritis) of knee 09/20/2014  . CKD (chronic  kidney disease) stage 3, GFR 30-59 ml/min (HCC) 08/04/2014  . Anemia of chronic disease 08/04/2014  . Hyperlipidemia   . Essential hypertension 04/20/2014  . Vascular dementia with behavior disturbance 04/20/2014  . Paroxysmal atrial fibrillation ([place]) 04/20/2014  . Basal ganglia infarction ([place]) 04/17/2014  . History of breast cancer 08/23/2012    CMP     Component Value Date/Time   NA 141 08/03/2017   K 4.3 08/03/2017   CL 105 12/06/2016 0346   CO2 20 (L) 12/06/2016 0346   GLUCOSE 85 12/06/2016 0346   BUN 32 (A) 08/03/2017   CREATININE 1.1 08/03/2017   CREATININE 1.43 (H) 12/06/2016 0346   CALCIUM 7.6 (L) 12/06/2016 0346   PROT 6.3 (L) 12/02/2016 0556   ALBUMIN 2.3 (L) 12/02/2016 0556   AST 12 (A) 07/23/2017   ALT 9 07/23/2017   ALKPHOS 73 07/23/2017   BILITOT 0.9 12/02/2016 0556   GFRNONAA 31 (L) 12/06/2016 0346   GFRAA 36 (L) 12/06/2016 0346   Recent Labs    07/23/17 08/03/17  NA 143 141  K 4.6 4.3  BUN 34* 32*  CREATININE 1.1 1.1   Recent Labs    07/23/17  AST 12*  ALT 9  ALKPHOS 73   Recent Labs    07/23/17  WBC 8.2  HGB 11.4*  HCT 36  PLT 200   Recent Labs    05/26/17 07/23/17  CHOL 97 110  LDLCALC 50 60  TRIG 73 68   No results found for: [facility] Lab Results  Component Value Date   TSH 4.22 07/23/2017   Lab Results  Component Value Date   HGBA1C 5.6 07/23/2017   Lab Results  Component Value Date   CHOL 110 07/23/2017   HDL 37 07/23/2017   LDLCALC 60 07/23/2017   TRIG 68 07/23/2017   CHOLHDL 5.7 04/18/2014    Significant Diagnostic Results in last 30 days:  No results found.  Assessment and Plan  Essential hypertension Controlled; continue metoprolol 12.5 mg 3 times daily and Lasix 40 mg daily  Vascular dementia with behavior disturbance Chronic and stable; continue Namenda 10 mg twice daily  Paroxysmal atrial fibrillation (HCC) Stable; rate controlled with metoprolol 12.5 mg 3 times daily and prophylaxed with Eliquis  2.5 mg twice daily     Hoorain Kozakiewicz D. Sheppard Coil, MD

## 2018-02-10 DIAGNOSIS — H3530 Unspecified macular degeneration: Secondary | ICD-10-CM | POA: Diagnosis not present

## 2018-02-10 DIAGNOSIS — M6281 Muscle weakness (generalized): Secondary | ICD-10-CM | POA: Diagnosis not present

## 2018-02-10 DIAGNOSIS — I69354 Hemiplegia and hemiparesis following cerebral infarction affecting left non-dominant side: Secondary | ICD-10-CM | POA: Diagnosis not present

## 2018-02-10 DIAGNOSIS — R278 Other lack of coordination: Secondary | ICD-10-CM | POA: Diagnosis not present

## 2018-02-10 DIAGNOSIS — F0150 Vascular dementia without behavioral disturbance: Secondary | ICD-10-CM | POA: Diagnosis not present

## 2018-02-10 DIAGNOSIS — R1312 Dysphagia, oropharyngeal phase: Secondary | ICD-10-CM | POA: Diagnosis not present

## 2018-02-12 ENCOUNTER — Non-Acute Institutional Stay (SKILLED_NURSING_FACILITY): Payer: Medicare Other | Admitting: Internal Medicine

## 2018-02-12 DIAGNOSIS — L03116 Cellulitis of left lower limb: Secondary | ICD-10-CM | POA: Diagnosis not present

## 2018-02-12 DIAGNOSIS — R278 Other lack of coordination: Secondary | ICD-10-CM | POA: Diagnosis not present

## 2018-02-12 DIAGNOSIS — R1312 Dysphagia, oropharyngeal phase: Secondary | ICD-10-CM | POA: Diagnosis not present

## 2018-02-12 DIAGNOSIS — M6281 Muscle weakness (generalized): Secondary | ICD-10-CM | POA: Diagnosis not present

## 2018-02-12 DIAGNOSIS — F0150 Vascular dementia without behavioral disturbance: Secondary | ICD-10-CM | POA: Diagnosis not present

## 2018-02-12 DIAGNOSIS — H3530 Unspecified macular degeneration: Secondary | ICD-10-CM | POA: Diagnosis not present

## 2018-02-12 DIAGNOSIS — I69354 Hemiplegia and hemiparesis following cerebral infarction affecting left non-dominant side: Secondary | ICD-10-CM | POA: Diagnosis not present

## 2018-02-14 DIAGNOSIS — M6281 Muscle weakness (generalized): Secondary | ICD-10-CM | POA: Diagnosis not present

## 2018-02-14 DIAGNOSIS — I69354 Hemiplegia and hemiparesis following cerebral infarction affecting left non-dominant side: Secondary | ICD-10-CM | POA: Diagnosis not present

## 2018-02-14 DIAGNOSIS — R278 Other lack of coordination: Secondary | ICD-10-CM | POA: Diagnosis not present

## 2018-02-14 DIAGNOSIS — R1312 Dysphagia, oropharyngeal phase: Secondary | ICD-10-CM | POA: Diagnosis not present

## 2018-02-14 DIAGNOSIS — F0150 Vascular dementia without behavioral disturbance: Secondary | ICD-10-CM | POA: Diagnosis not present

## 2018-02-14 DIAGNOSIS — H3530 Unspecified macular degeneration: Secondary | ICD-10-CM | POA: Diagnosis not present

## 2018-02-15 ENCOUNTER — Encounter: Payer: Self-pay | Admitting: Internal Medicine

## 2018-02-15 DIAGNOSIS — R278 Other lack of coordination: Secondary | ICD-10-CM | POA: Diagnosis not present

## 2018-02-15 DIAGNOSIS — I69354 Hemiplegia and hemiparesis following cerebral infarction affecting left non-dominant side: Secondary | ICD-10-CM | POA: Diagnosis not present

## 2018-02-15 DIAGNOSIS — R1312 Dysphagia, oropharyngeal phase: Secondary | ICD-10-CM | POA: Diagnosis not present

## 2018-02-15 DIAGNOSIS — F0150 Vascular dementia without behavioral disturbance: Secondary | ICD-10-CM | POA: Diagnosis not present

## 2018-02-15 DIAGNOSIS — H3530 Unspecified macular degeneration: Secondary | ICD-10-CM | POA: Diagnosis not present

## 2018-02-15 DIAGNOSIS — M6281 Muscle weakness (generalized): Secondary | ICD-10-CM | POA: Diagnosis not present

## 2018-02-15 NOTE — Assessment & Plan Note (Signed)
Chronic and stable; continue Namenda 10 mg twice daily

## 2018-02-15 NOTE — Assessment & Plan Note (Signed)
Controlled; continue metoprolol 12.5 mg 3 times daily and Lasix 40 mg daily

## 2018-02-15 NOTE — Assessment & Plan Note (Signed)
Stable; rate controlled with metoprolol 12.5 mg 3 times daily and prophylaxed with Eliquis 2.5 mg twice daily

## 2018-02-16 DIAGNOSIS — R278 Other lack of coordination: Secondary | ICD-10-CM | POA: Diagnosis not present

## 2018-02-16 DIAGNOSIS — R1312 Dysphagia, oropharyngeal phase: Secondary | ICD-10-CM | POA: Diagnosis not present

## 2018-02-16 DIAGNOSIS — F0150 Vascular dementia without behavioral disturbance: Secondary | ICD-10-CM | POA: Diagnosis not present

## 2018-02-16 DIAGNOSIS — M6281 Muscle weakness (generalized): Secondary | ICD-10-CM | POA: Diagnosis not present

## 2018-02-16 DIAGNOSIS — I69354 Hemiplegia and hemiparesis following cerebral infarction affecting left non-dominant side: Secondary | ICD-10-CM | POA: Diagnosis not present

## 2018-02-16 DIAGNOSIS — H3530 Unspecified macular degeneration: Secondary | ICD-10-CM | POA: Diagnosis not present

## 2018-02-17 DIAGNOSIS — M6281 Muscle weakness (generalized): Secondary | ICD-10-CM | POA: Diagnosis not present

## 2018-02-17 DIAGNOSIS — F0150 Vascular dementia without behavioral disturbance: Secondary | ICD-10-CM | POA: Diagnosis not present

## 2018-02-17 DIAGNOSIS — R278 Other lack of coordination: Secondary | ICD-10-CM | POA: Diagnosis not present

## 2018-02-17 DIAGNOSIS — R1312 Dysphagia, oropharyngeal phase: Secondary | ICD-10-CM | POA: Diagnosis not present

## 2018-02-17 DIAGNOSIS — H3530 Unspecified macular degeneration: Secondary | ICD-10-CM | POA: Diagnosis not present

## 2018-02-17 DIAGNOSIS — I69354 Hemiplegia and hemiparesis following cerebral infarction affecting left non-dominant side: Secondary | ICD-10-CM | POA: Diagnosis not present

## 2018-02-18 DIAGNOSIS — H3530 Unspecified macular degeneration: Secondary | ICD-10-CM | POA: Diagnosis not present

## 2018-02-18 DIAGNOSIS — F0150 Vascular dementia without behavioral disturbance: Secondary | ICD-10-CM | POA: Diagnosis not present

## 2018-02-18 DIAGNOSIS — M6281 Muscle weakness (generalized): Secondary | ICD-10-CM | POA: Diagnosis not present

## 2018-02-18 DIAGNOSIS — R278 Other lack of coordination: Secondary | ICD-10-CM | POA: Diagnosis not present

## 2018-02-18 DIAGNOSIS — I69354 Hemiplegia and hemiparesis following cerebral infarction affecting left non-dominant side: Secondary | ICD-10-CM | POA: Diagnosis not present

## 2018-02-18 DIAGNOSIS — R1312 Dysphagia, oropharyngeal phase: Secondary | ICD-10-CM | POA: Diagnosis not present

## 2018-02-19 DIAGNOSIS — R278 Other lack of coordination: Secondary | ICD-10-CM | POA: Diagnosis not present

## 2018-02-19 DIAGNOSIS — H3530 Unspecified macular degeneration: Secondary | ICD-10-CM | POA: Diagnosis not present

## 2018-02-19 DIAGNOSIS — F0150 Vascular dementia without behavioral disturbance: Secondary | ICD-10-CM | POA: Diagnosis not present

## 2018-02-19 DIAGNOSIS — R1312 Dysphagia, oropharyngeal phase: Secondary | ICD-10-CM | POA: Diagnosis not present

## 2018-02-19 DIAGNOSIS — M6281 Muscle weakness (generalized): Secondary | ICD-10-CM | POA: Diagnosis not present

## 2018-02-19 DIAGNOSIS — I69354 Hemiplegia and hemiparesis following cerebral infarction affecting left non-dominant side: Secondary | ICD-10-CM | POA: Diagnosis not present

## 2018-02-20 ENCOUNTER — Encounter: Payer: Self-pay | Admitting: Internal Medicine

## 2018-02-20 NOTE — Progress Notes (Signed)
Location:  [place] of Service:  SNF (31)  Hennie Duos, MD  Patient Care Team: Hennie Duos, MD as PCP - General (Internal Medicine)  Extended Emergency Contact Information Primary Emergency Contact: [facility] Address: [place], [place] [address] [name] of [place] Phone: [phone] Relation: Grandson    Allergies: Patient has no known allergies.  Chief Complaint  Patient presents with  . Acute Visit    HPI: Patient is 84 y.o. female who being seen for reports of redness to anterior leg, noted today.  Patient with a dementia, but it appears tender to palpation.  It involves left anterior leg and ankle but does not involve her foot.  There are no open wounds or other reasons for cellulitis.  Patient has not had fever, nausea, change in mental status or any other sign of systemic illness.  Past Medical History:  Diagnosis Date  . Anemia of chronic disease 08/04/2014  . Anxiety 07/04/2015  . Arthritis   . Basal ganglia infarction ([place]) 04/17/2014  . Chronic diastolic CHF (congestive heart failure) ([place]) 12/02/2016  . CKD (chronic kidney disease) stage 3, GFR 30-59 ml/min ([place]) 08/04/2014  . COPD (chronic obstructive pulmonary disease) ([place])   . Dementia 02/02/2012  . Depression 04/23/2016  . DVT of lower extremity (deep venous thrombosis) ([place]) 01/23/2015  . Essential hypertension 04/20/2014  . History of breast cancer 2009   Left breast  s/p lumpectomy and rads. declined hormonal tx. last mammogram 2011  . Hyperlipidemia   . Hypertension   . OA (osteoarthritis) of knee 09/20/2014  . PAF (paroxysmal atrial fibrillation) ([place])   . Paroxysmal atrial fibrillation ([place]) 04/20/2014  . Pyelonephritis 12/09/2016  . SVT (supraventricular tachycardia) ([place])   . Vascular dementia with behavior disturbance 04/20/2014    Past Surgical History:  Procedure Laterality Date  . BREAST LUMPECTOMY Left 2009   left breast  .  CESAREAN SECTION    . EYE SURGERY     cataracts x3    Allergies as of 02/12/2018   No Known Allergies     Medication List        Accurate as of 02/12/18 11:59 PM. Always use your most recent med list.          acetaminophen 325 MG tablet Commonly known as:  TYLENOL Take 650 mg by mouth every 6 (six) hours as needed. Do not exceed 3000 mg in 24 hours   ARTIFICIAL TEAR OP Apply to eye. Instill 1 drop in both eyes twice daily (wait 3-5 minutes between different eye drops)   atorvastatin 20 MG tablet Commonly known as:  LIPITOR Take 20 mg by mouth daily.   ELIQUIS 2.5 MG Tabs tablet Generic drug:  apixaban Take 2.5 mg by mouth 2 (two) times daily.   ferrous sulfate 325 (65 FE) MG tablet Take 325 mg by mouth daily. anemia   furosemide 40 MG tablet Commonly known as:  LASIX Take 40 mg by mouth daily.   ICAPS AREDS 2 Chew Chew 2 tablets by mouth daily.   LORazepam 0.5 MG tablet Commonly known as:  ATIVAN Take 0.5 mg by mouth 2 (two) times daily. Hold for sedation   memantine 10 MG tablet Commonly known as:  NAMENDA Take 10 mg by mouth 2 (two) times daily.   metoprolol tartrate 25 MG tablet Commonly known as:  LOPRESSOR Take 0.5 tablets (12.5 mg total) by mouth 3 (three) times  daily.   NUTRITIONAL SUPPLEMENT PO Mechanical Soft with not theraputic restrictions.  Nectar Thick liquids.  Soda and tomato juice ok unthickened   senna 8.6 MG Tabs tablet Commonly known as:  SENOKOT Take 1 tablet by mouth daily.   traZODone 50 MG tablet Commonly known as:  DESYREL Take 50 mg by mouth at bedtime.       No orders of the defined types were placed in this encounter.   Immunization History  Administered Date(s) Administered  . Influenza Inj Mdck Quad Pf 03/09/2012  . Influenza Split 04/20/2009, 05/12/2010  . Influenza, Seasonal, Injecte, Preservative Fre 03/21/2013  . Influenza-Unspecified 03/22/2015  . PPD Test 05/08/2014  . Pneumococcal Polysaccharide-23  05/12/2010  . Td 01/14/2008  . Zoster 01/14/2008    Social History   Tobacco Use  . Smoking status: Never Smoker  . Smokeless tobacco: Never Used  Substance Use Topics  . Alcohol use: No    Review of Systems unable to obtain secondary to dementia; nursing- per history of present illness     Vitals:   02/20/18 1348  BP: (!) 128/56  Pulse: 74  Resp: 18  Temp: 97.9 F (36.6 C)   Body mass index is 31.89 kg/m. Physical Exam  GENERAL APPEARANCE: Alert, conversant, No acute distress  SKIN: Left anterior leg with redness and heat, minimal swelling HEENT: Unremarkable RESPIRATORY: Breathing is even, unlabored. Lung sounds are clear   CARDIOVASCULAR: Heart RRR no murmurs, rubs or gallops. No peripheral edema  GASTROINTESTINAL: Abdomen is soft, non-tender, not distended w/ normal bowel sounds.  GENITOURINARY: Bladder non tender, not distended  MUSCULOSKELETAL: No abnormal joints or musculature NEUROLOGIC: Cranial nerves 2-12 grossly intact. Moves all extremities PSYCHIATRIC: Dementia, no behavioral issues  Patient Active Problem List   Diagnosis Date Noted  . Dyslipidemia 10/23/2017  . Klebsiella pneumoniae (k. pneumoniae) as the cause of diseases classified elsewhere 12/09/2016  . Pyelonephritis 12/09/2016  . Chronic diastolic CHF (congestive heart failure) ([place]) 12/02/2016  . Protein calorie malnutrition ([place]) 12/02/2016  . Macular degeneration 09/06/2016  . Depression 04/23/2016  . Poor appetite 10/11/2015  . Loss of weight 10/11/2015  . Diarrhea 09/10/2015  . Anxiety 07/04/2015  . Mood disorder ([place]) 07/04/2015  . DVT of lower extremity (deep venous thrombosis) ([place]) 01/23/2015  . OA (osteoarthritis) of knee 09/20/2014  . CKD (chronic kidney disease) stage 3, GFR 30-59 ml/min (HCC) 08/04/2014  . Anemia of chronic disease 08/04/2014  . Hyperlipidemia   . Essential hypertension 04/20/2014  . Vascular dementia with behavior disturbance 04/20/2014  . Paroxysmal  atrial fibrillation ([place]) 04/20/2014  . Basal ganglia infarction ([place]) 04/17/2014  . History of breast cancer 08/23/2012    CMP     Component Value Date/Time   NA 141 08/03/2017   K 4.3 08/03/2017   CL 105 12/06/2016 0346   CO2 20 (L) 12/06/2016 0346   GLUCOSE 85 12/06/2016 0346   BUN 32 (A) 08/03/2017   CREATININE 1.1 08/03/2017   CREATININE 1.43 (H) 12/06/2016 0346   CALCIUM 7.6 (L) 12/06/2016 0346   PROT 6.3 (L) 12/02/2016 0556   ALBUMIN 2.3 (L) 12/02/2016 0556   AST 12 (A) 07/23/2017   ALT 9 07/23/2017   ALKPHOS 73 07/23/2017   BILITOT 0.9 12/02/2016 0556   GFRNONAA 31 (L) 12/06/2016 0346   GFRAA 36 (L) 12/06/2016 0346   Recent Labs    07/23/17 08/03/17  NA 143 141  K 4.6 4.3  BUN 34* 32*  CREATININE 1.1 1.1   Recent Labs  07/23/17  AST 12*  ALT 9  ALKPHOS 73   Recent Labs    07/23/17  WBC 8.2  HGB 11.4*  HCT 36  PLT 200   Recent Labs    05/26/17 07/23/17  CHOL 97 110  LDLCALC 50 60  TRIG 73 68   No results found for: [facility] Lab Results  Component Value Date   TSH 4.22 07/23/2017   Lab Results  Component Value Date   HGBA1C 5.6 07/23/2017   Lab Results  Component Value Date   CHOL 110 07/23/2017   HDL 37 07/23/2017   LDLCALC 60 07/23/2017   TRIG 68 07/23/2017   CHOLHDL 5.7 04/18/2014    Significant Diagnostic Results in last 30 days:  No results found.  Assessment and Plan  Colitis left leg- patient's creatinine 1.1; I have written for doxycycline 100 mg twice daily for 5 days; will monitor progress    [name], MD

## 2018-02-25 DIAGNOSIS — F0390 Unspecified dementia without behavioral disturbance: Secondary | ICD-10-CM | POA: Diagnosis not present

## 2018-02-25 DIAGNOSIS — F39 Unspecified mood [affective] disorder: Secondary | ICD-10-CM | POA: Diagnosis not present

## 2018-02-25 DIAGNOSIS — G4700 Insomnia, unspecified: Secondary | ICD-10-CM | POA: Diagnosis not present

## 2018-02-25 DIAGNOSIS — F419 Anxiety disorder, unspecified: Secondary | ICD-10-CM | POA: Diagnosis not present

## 2018-03-08 ENCOUNTER — Encounter: Payer: Self-pay | Admitting: Internal Medicine

## 2018-03-08 ENCOUNTER — Non-Acute Institutional Stay (SKILLED_NURSING_FACILITY): Payer: Medicare Other | Admitting: Internal Medicine

## 2018-03-08 DIAGNOSIS — H3530 Unspecified macular degeneration: Secondary | ICD-10-CM

## 2018-03-08 DIAGNOSIS — M1712 Unilateral primary osteoarthritis, left knee: Secondary | ICD-10-CM | POA: Diagnosis not present

## 2018-03-08 DIAGNOSIS — I5032 Chronic diastolic (congestive) heart failure: Secondary | ICD-10-CM | POA: Diagnosis not present

## 2018-03-08 NOTE — Progress Notes (Signed)
Location:  [facility] Room Number: 217D Place of Service:  SNF [phone])  Cindy Trevino. Cindy Coil, MD  Patient Care Team: Hennie Duos, MD as PCP - General (Internal Medicine)  Extended Emergency Contact Information Primary Emergency Contact: [facility] Address: [place], [place] [address] [name] of [place] Phone: [phone] Relation: Grandson    Allergies: Patient has no known allergies.  Chief Complaint  Patient presents with  . Medical Management of Chronic Issues    Routine Visit    HPI: Patient is 84 y.o. female who   Past Medical History:  Diagnosis Date  . Anemia of chronic disease 08/04/2014  . Anxiety 07/04/2015  . Arthritis   . Basal ganglia infarction ([place]) 04/17/2014  . Chronic diastolic CHF (congestive heart failure) ([place]) 12/02/2016  . CKD (chronic kidney disease) stage 3, GFR 30-59 ml/min ([place]) 08/04/2014  . COPD (chronic obstructive pulmonary disease) ([place])   . Dementia 02/02/2012  . Depression 04/23/2016  . DVT of lower extremity (deep venous thrombosis) ([place]) 01/23/2015  . Essential hypertension 04/20/2014  . History of breast cancer 2009   Left breast  s/p lumpectomy and rads. declined hormonal tx. last mammogram 2011  . Hyperlipidemia   . Hypertension   . OA (osteoarthritis) of knee 09/20/2014  . PAF (paroxysmal atrial fibrillation) ([place])   . Paroxysmal atrial fibrillation ([place]) 04/20/2014  . Pyelonephritis 12/09/2016  . SVT (supraventricular tachycardia) ([place])   . Vascular dementia with behavior disturbance 04/20/2014    Past Surgical History:  Procedure Laterality Date  . BREAST LUMPECTOMY Left 2009   left breast  . CESAREAN SECTION    . EYE SURGERY     cataracts x3    Allergies as of 03/08/2018   No Known Allergies     Medication List        Accurate as of 03/08/18 11:59 PM. Always use your most recent med list.          acetaminophen 325 MG tablet Commonly known as:  TYLENOL Take  650 mg by mouth every 6 (six) hours as needed for moderate pain. Do not exceed 3000 mg in 24 hours   acetaminophen 650 MG CR tablet Commonly known as:  TYLENOL Take 650 mg by mouth 2 (two) times daily.   ARTIFICIAL TEAR OP Apply to eye. Instill 1 drop in both eyes twice daily (wait 3-5 minutes between different eye drops)   atorvastatin 20 MG tablet Commonly known as:  LIPITOR Take 20 mg by mouth daily.   atorvastatin 20 MG tablet Commonly known as:  LIPITOR Take 20 mg by mouth daily.   ELIQUIS 2.5 MG Tabs tablet Generic drug:  apixaban Take 2.5 mg by mouth 2 (two) times daily.   ELIQUIS 2.5 MG Tabs tablet Generic drug:  apixaban Take 2.5 mg by mouth 2 (two) times daily.   ferrous sulfate 325 (65 FE) MG tablet Take 325 mg by mouth daily. anemia   ferrous sulfate 325 (65 FE) MG tablet Take 325 mg by mouth daily with breakfast.   furosemide 40 MG tablet Commonly known as:  LASIX Take 40 mg by mouth daily.   furosemide 40 MG tablet Commonly known as:  LASIX Take 40 mg by mouth daily.   ICAPS AREDS 2 Chew Chew 2 tablets by mouth daily.   PRESERVISION AREDS 2 Chew Chew 2 tablets by mouth daily.   LORazepam 0.5 MG tablet Commonly known as:  ATIVAN  Take 0.5 mg by mouth 2 (two) times daily. Hold for sedation   LORazepam 0.5 MG tablet Commonly known as:  ATIVAN Take 0.5 mg by mouth 2 (two) times daily.   memantine 10 MG tablet Commonly known as:  NAMENDA Take 10 mg by mouth 2 (two) times daily.   memantine 10 MG tablet Commonly known as:  NAMENDA Take 10 mg by mouth 2 (two) times daily.   metoprolol tartrate 25 MG tablet Commonly known as:  LOPRESSOR Take 0.5 tablets (12.5 mg total) by mouth 3 (three) times daily.   NUTRITIONAL SUPPLEMENT PO Mechanical Soft with not theraputic restrictions.  Nectar Thick liquids.  Soda and tomato juice ok unthickened   REFRESH OPTIVE ADVANCED OP Apply 1 drop to eye 2 (two) times daily. Apply to both eyes bid   senna 8.6  MG Tabs tablet Commonly known as:  SENOKOT Take 1 tablet by mouth at bedtime.   senna 8.6 MG tablet Commonly known as:  SENOKOT Take 1 tablet by mouth daily.   traZODone 50 MG tablet Commonly known as:  DESYREL Take 50 mg by mouth at bedtime.   traZODone 50 MG tablet Commonly known as:  DESYREL Take 50 mg by mouth at bedtime.       No orders of the defined types were placed in this encounter.   Immunization History  Administered Date(s) Administered  . Influenza Inj Mdck Quad Pf 03/09/2012  . Influenza Split 04/20/2009, 05/12/2010  . Influenza, Seasonal, Injecte, Preservative Fre 03/21/2013  . Influenza-Unspecified 03/22/2015  . PPD Test 05/08/2014  . Pneumococcal Polysaccharide-23 05/12/2010  . Td 01/14/2008  . Zoster 01/14/2008    Social History   Tobacco Use  . Smoking status: Never Smoker  . Smokeless tobacco: Never Used  Substance Use Topics  . Alcohol use: No    Review of Systems  DATA OBTAINED: from patient, nurse, medical record, family member GENERAL:  no fevers, fatigue, appetite changes SKIN: No itching, rash HEENT: No complaint RESPIRATORY: No cough, wheezing, SOB CARDIAC: No chest pain, palpitations, lower extremity edema  GI: No abdominal pain, No N/V/D or constipation, No heartburn or reflux  GU: No dysuria, frequency or urgency, or incontinence  MUSCULOSKELETAL: No unrelieved bone/joint pain NEUROLOGIC: No headache, dizziness  PSYCHIATRIC: No overt anxiety or sadness  Vitals:   03/11/18 1617  BP: 134/67  Pulse: 62  Resp: 18  Temp: 98.2 F (36.8 C)   Body mass index is 29.41 kg/m. Physical Exam  GENERAL APPEARANCE: Alert, conversant, No acute distress  SKIN: No diaphoresis rash HEENT: Unremarkable RESPIRATORY: Breathing is even, unlabored. Lung sounds are clear   CARDIOVASCULAR: Heart RRR no murmurs, rubs or gallops. No peripheral edema  GASTROINTESTINAL: Abdomen is soft, non-tender, not distended w/ normal bowel sounds.    GENITOURINARY: Bladder non tender, not distended  MUSCULOSKELETAL: No abnormal joints or musculature NEUROLOGIC: Cranial nerves 2-12 grossly intact. Moves all extremities PSYCHIATRIC: Mood and affect appropriate to situation, no behavioral issues  Patient Active Problem List   Diagnosis Date Noted  . Dyslipidemia 10/23/2017  . Klebsiella pneumoniae (k. pneumoniae) as the cause of diseases classified elsewhere 12/09/2016  . Pyelonephritis 12/09/2016  . Chronic diastolic CHF (congestive heart failure) ([place]) 12/02/2016  . Protein calorie malnutrition ([place]) 12/02/2016  . Macular degeneration 09/06/2016  . Depression 04/23/2016  . Poor appetite 10/11/2015  . Loss of weight 10/11/2015  . Diarrhea 09/10/2015  . Anxiety 07/04/2015  . Mood disorder ([place]) 07/04/2015  . DVT of lower extremity (deep venous thrombosis) ([place])  01/23/2015  . OA (osteoarthritis) of knee 09/20/2014  . CKD (chronic kidney disease) stage 3, GFR 30-59 ml/min (HCC) 08/04/2014  . Anemia of chronic disease 08/04/2014  . Hyperlipidemia   . Essential hypertension 04/20/2014  . Vascular dementia with behavior disturbance 04/20/2014  . Paroxysmal atrial fibrillation ([place]) 04/20/2014  . Basal ganglia infarction ([place]) 04/17/2014  . History of breast cancer 08/23/2012    CMP     Component Value Date/Time   NA 141 08/03/2017   K 4.3 08/03/2017   CL 105 12/06/2016 0346   CO2 20 (L) 12/06/2016 0346   GLUCOSE 85 12/06/2016 0346   BUN 32 (A) 08/03/2017   CREATININE 1.1 08/03/2017   CREATININE 1.43 (H) 12/06/2016 0346   CALCIUM 7.6 (L) 12/06/2016 0346   PROT 6.3 (L) 12/02/2016 0556   ALBUMIN 2.3 (L) 12/02/2016 0556   AST 12 (A) 07/23/2017   ALT 9 07/23/2017   ALKPHOS 73 07/23/2017   BILITOT 0.9 12/02/2016 0556   GFRNONAA 31 (L) 12/06/2016 0346   GFRAA 36 (L) 12/06/2016 0346   Recent Labs    07/23/17 08/03/17  NA 143 141  K 4.6 4.3  BUN 34* 32*  CREATININE 1.1 1.1   Recent Labs    07/23/17  AST 12*  ALT 9   ALKPHOS 73   Recent Labs    07/23/17  WBC 8.2  HGB 11.4*  HCT 36  PLT 200   Recent Labs    05/26/17 07/23/17  CHOL 97 110  LDLCALC 50 60  TRIG 73 68   No results found for: [facility] Lab Results  Component Value Date   TSH 4.22 07/23/2017   Lab Results  Component Value Date   HGBA1C 5.6 07/23/2017   Lab Results  Component Value Date   CHOL 110 07/23/2017   HDL 37 07/23/2017   LDLCALC 60 07/23/2017   TRIG 68 07/23/2017   CHOLHDL 5.7 04/18/2014    Significant Diagnostic Results in last 30 days:  No results found.  Assessment and Plan  Location:  [facility] Room Number: 217D Place of Service:  SNF (31)  Hennie Duos, MD  Patient Care Team: Hennie Duos, MD as PCP - General (Internal Medicine)  Extended Emergency Contact Information Primary Emergency Contact: [facility] Address: [place], [place] [address] [name] of [place] Phone: [phone] Relation: Grandson    Allergies: Patient has no known allergies.  Chief Complaint  Patient presents with  . Medical Management of Chronic Issues    Routine Visit    HPI: Patient is 84 y.o. female who is being seen for routine issues of macular degeneration, osteoarthritis, and chronic diastolic congestive heart failure.  Past Medical History:  Diagnosis Date  . Anemia of chronic disease 08/04/2014  . Anxiety 07/04/2015  . Arthritis   . Basal ganglia infarction ([place]) 04/17/2014  . Chronic diastolic CHF (congestive heart failure) ([place]) 12/02/2016  . CKD (chronic kidney disease) stage 3, GFR 30-59 ml/min ([place]) 08/04/2014  . COPD (chronic obstructive pulmonary disease) ([place])   . Dementia 02/02/2012  . Depression 04/23/2016  . DVT of lower extremity (deep venous thrombosis) ([place]) 01/23/2015  . Essential hypertension 04/20/2014  . History of breast cancer 2009   Left breast  s/p lumpectomy and rads. declined hormonal tx. last mammogram 2011  .  Hyperlipidemia   . Hypertension   . OA (osteoarthritis) of knee 09/20/2014  . PAF (paroxysmal atrial fibrillation) (  [place])   . Paroxysmal atrial fibrillation ([place]) 04/20/2014  . Pyelonephritis 12/09/2016  . SVT (supraventricular tachycardia) ([place])   . Vascular dementia with behavior disturbance 04/20/2014    Past Surgical History:  Procedure Laterality Date  . BREAST LUMPECTOMY Left 2009   left breast  . CESAREAN SECTION    . EYE SURGERY     cataracts x3    Allergies as of 03/08/2018   No Known Allergies     Medication List        Accurate as of 03/08/18 11:59 PM. Always use your most recent med list.          acetaminophen 325 MG tablet Commonly known as:  TYLENOL Take 650 mg by mouth every 6 (six) hours as needed for moderate pain. Do not exceed 3000 mg in 24 hours   acetaminophen 650 MG CR tablet Commonly known as:  TYLENOL Take 650 mg by mouth 2 (two) times daily.   ARTIFICIAL TEAR OP Apply to eye. Instill 1 drop in both eyes twice daily (wait 3-5 minutes between different eye drops)   atorvastatin 20 MG tablet Commonly known as:  LIPITOR Take 20 mg by mouth daily.   atorvastatin 20 MG tablet Commonly known as:  LIPITOR Take 20 mg by mouth daily.   ELIQUIS 2.5 MG Tabs tablet Generic drug:  apixaban Take 2.5 mg by mouth 2 (two) times daily.   ELIQUIS 2.5 MG Tabs tablet Generic drug:  apixaban Take 2.5 mg by mouth 2 (two) times daily.   ferrous sulfate 325 (65 FE) MG tablet Take 325 mg by mouth daily. anemia   ferrous sulfate 325 (65 FE) MG tablet Take 325 mg by mouth daily with breakfast.   furosemide 40 MG tablet Commonly known as:  LASIX Take 40 mg by mouth daily.   furosemide 40 MG tablet Commonly known as:  LASIX Take 40 mg by mouth daily.   ICAPS AREDS 2 Chew Chew 2 tablets by mouth daily.   PRESERVISION AREDS 2 Chew Chew 2 tablets by mouth daily.   LORazepam 0.5 MG tablet Commonly known as:  ATIVAN Take 0.5 mg by mouth 2 (two) times  daily. Hold for sedation   LORazepam 0.5 MG tablet Commonly known as:  ATIVAN Take 0.5 mg by mouth 2 (two) times daily.   memantine 10 MG tablet Commonly known as:  NAMENDA Take 10 mg by mouth 2 (two) times daily.   memantine 10 MG tablet Commonly known as:  NAMENDA Take 10 mg by mouth 2 (two) times daily.   metoprolol tartrate 25 MG tablet Commonly known as:  LOPRESSOR Take 0.5 tablets (12.5 mg total) by mouth 3 (three) times daily.   NUTRITIONAL SUPPLEMENT PO Mechanical Soft with not theraputic restrictions.  Nectar Thick liquids.  Soda and tomato juice ok unthickened   REFRESH OPTIVE ADVANCED OP Apply 1 drop to eye 2 (two) times daily. Apply to both eyes bid   senna 8.6 MG Tabs tablet Commonly known as:  SENOKOT Take 1 tablet by mouth at bedtime.   senna 8.6 MG tablet Commonly known as:  SENOKOT Take 1 tablet by mouth daily.   traZODone 50 MG tablet Commonly known as:  DESYREL Take 50 mg by mouth at bedtime.   traZODone 50 MG tablet Commonly known as:  DESYREL Take 50 mg by mouth at bedtime.       No orders of the defined types were placed in this encounter.   Immunization History  Administered Date(s) Administered  .  Influenza Inj Mdck Quad Pf 03/09/2012  . Influenza Split 04/20/2009, 05/12/2010  . Influenza, Seasonal, Injecte, Preservative Fre 03/21/2013  . Influenza-Unspecified 03/22/2015  . PPD Test 05/08/2014  . Pneumococcal Polysaccharide-23 05/12/2010  . Td 01/14/2008  . Zoster 01/14/2008    Social History   Tobacco Use  . Smoking status: Never Smoker  . Smokeless tobacco: Never Used  Substance Use Topics  . Alcohol use: No    Review of Systems  DATA OBTAINED: from patient-very limited; nursing-no acute concerns GENERAL:  no fevers, fatigue, appetite changes SKIN: No itching, rash HEENT: No complaint RESPIRATORY: No cough, wheezing, SOB CARDIAC: No chest pain, palpitations, lower extremity edema  GI: No abdominal pain, No N/V/D or  constipation, No heartburn or reflux  GU: No dysuria, frequency or urgency, or incontinence  MUSCULOSKELETAL: No unrelieved bone/joint pain NEUROLOGIC: No headache, dizziness  PSYCHIATRIC: No overt anxiety or sadness  Vitals:   03/11/18 1617  BP: 134/67  Pulse: 62  Resp: 18  Temp: 98.2 F (36.8 C)   Body mass index is 29.41 kg/m. Physical Exam  GENERAL APPEARANCE: Alert, conversant, No acute distress  SKIN: No diaphoresis rash HEENT: Unremarkable RESPIRATORY: Breathing is even, unlabored. Lung sounds are clear   CARDIOVASCULAR: Heart RRR no murmurs, rubs or gallops. No peripheral edema  GASTROINTESTINAL: Abdomen is soft, non-tender, not distended w/ normal bowel sounds.  GENITOURINARY: Bladder non tender, not distended  MUSCULOSKELETAL: No abnormal joints or musculature NEUROLOGIC: Cranial nerves 2-12 grossly intact. Moves all extremities PSYCHIATRIC: Dementia, no behavioral issues  Patient Active Problem List   Diagnosis Date Noted  . Dyslipidemia 10/23/2017  . Klebsiella pneumoniae (k. pneumoniae) as the cause of diseases classified elsewhere 12/09/2016  . Pyelonephritis 12/09/2016  . Chronic diastolic CHF (congestive heart failure) ([place]) 12/02/2016  . Protein calorie malnutrition ([place]) 12/02/2016  . Macular degeneration 09/06/2016  . Depression 04/23/2016  . Poor appetite 10/11/2015  . Loss of weight 10/11/2015  . Diarrhea 09/10/2015  . Anxiety 07/04/2015  . Mood disorder ([place]) 07/04/2015  . DVT of lower extremity (deep venous thrombosis) ([place]) 01/23/2015  . OA (osteoarthritis) of knee 09/20/2014  . CKD (chronic kidney disease) stage 3, GFR 30-59 ml/min ([facility]) 08/04/2014  . Anemia of chronic disease 08/04/2014  . Hyperlipidemia   . Essential hypertension 04/20/2014  . Vascular dementia with behavior disturbance 04/20/2014  . Paroxysmal atrial fibrillation ([place]) 04/20/2014  . Basal ganglia infarction ([place]) 04/17/2014  . History of breast cancer 08/23/2012     CMP     Component Value Date/Time   NA 141 08/03/2017   K 4.3 08/03/2017   CL 105 12/06/2016 0346   CO2 20 (L) 12/06/2016 0346   GLUCOSE 85 12/06/2016 0346   BUN 32 (A) 08/03/2017   CREATININE 1.1 08/03/2017   CREATININE 1.43 (H) 12/06/2016 0346   CALCIUM 7.6 (L) 12/06/2016 0346   PROT 6.3 (L) 12/02/2016 0556   ALBUMIN 2.3 (L) 12/02/2016 0556   AST 12 (A) 07/23/2017   ALT 9 07/23/2017   ALKPHOS 73 07/23/2017   BILITOT 0.9 12/02/2016 0556   GFRNONAA 31 (L) 12/06/2016 0346   GFRAA 36 (L) 12/06/2016 0346   Recent Labs    07/23/17 08/03/17  NA 143 141  K 4.6 4.3  BUN 34* 32*  CREATININE 1.1 1.1   Recent Labs    07/23/17  AST 12*  ALT 9  ALKPHOS 73   Recent Labs    07/23/17  WBC 8.2  HGB 11.4*  HCT 36  PLT 200  Recent Labs    05/26/17 07/23/17  CHOL 97 110  LDLCALC 50 60  TRIG 73 68   No results found for: [facility] Lab Results  Component Value Date   TSH 4.22 07/23/2017   Lab Results  Component Value Date   HGBA1C 5.6 07/23/2017   Lab Results  Component Value Date   CHOL 110 07/23/2017   HDL 37 07/23/2017   LDLCALC 60 07/23/2017   TRIG 68 07/23/2017   CHOLHDL 5.7 04/18/2014    Significant Diagnostic Results in last 30 days:  No results found.  Assessment and Plan  Macular degeneration No declines noted; continue PreserVision AREDS twice daily  OA (osteoarthritis) of knee No reports of pain; continue Tylenol 650 mg twice daily  Chronic diastolic CHF (congestive heart failure) (HCC) Exacerbations in a while; continue metoprolol 12.5 mg twice daily and Lasix 40 mg daily     [name], MD    Cindy Trevino. Cindy Coil, MD

## 2018-03-09 ENCOUNTER — Emergency Department (HOSPITAL_COMMUNITY)
Admission: EM | Admit: 2018-03-09 | Discharge: 2018-03-09 | Disposition: A | Discharge status: Skilled Nursing Facility | Payer: Medicare Other | Attending: Emergency Medicine | Admitting: Emergency Medicine

## 2018-03-09 ENCOUNTER — Other Ambulatory Visit: Payer: Self-pay

## 2018-03-09 ENCOUNTER — Encounter (HOSPITAL_COMMUNITY): Payer: Self-pay | Admitting: Emergency Medicine

## 2018-03-09 DIAGNOSIS — Z7901 Long term (current) use of anticoagulants: Secondary | ICD-10-CM | POA: Insufficient documentation

## 2018-03-09 DIAGNOSIS — R58 Hemorrhage, not elsewhere classified: Secondary | ICD-10-CM | POA: Diagnosis not present

## 2018-03-09 DIAGNOSIS — I10 Essential (primary) hypertension: Secondary | ICD-10-CM | POA: Diagnosis not present

## 2018-03-09 DIAGNOSIS — Z853 Personal history of malignant neoplasm of breast: Secondary | ICD-10-CM | POA: Insufficient documentation

## 2018-03-09 DIAGNOSIS — S81811A Laceration without foreign body, right lower leg, initial encounter: Secondary | ICD-10-CM | POA: Insufficient documentation

## 2018-03-09 DIAGNOSIS — Z79899 Other long term (current) drug therapy: Secondary | ICD-10-CM | POA: Diagnosis not present

## 2018-03-09 DIAGNOSIS — N183 Chronic kidney disease, stage 3 (moderate): Secondary | ICD-10-CM | POA: Insufficient documentation

## 2018-03-09 DIAGNOSIS — Y9389 Activity, other specified: Secondary | ICD-10-CM | POA: Diagnosis not present

## 2018-03-09 DIAGNOSIS — Y92129 Unspecified place in nursing home as the place of occurrence of the external cause: Secondary | ICD-10-CM | POA: Diagnosis not present

## 2018-03-09 DIAGNOSIS — J449 Chronic obstructive pulmonary disease, unspecified: Secondary | ICD-10-CM | POA: Insufficient documentation

## 2018-03-09 DIAGNOSIS — W228XXA Striking against or struck by other objects, initial encounter: Secondary | ICD-10-CM | POA: Diagnosis not present

## 2018-03-09 DIAGNOSIS — Z7401 Bed confinement status: Secondary | ICD-10-CM | POA: Diagnosis not present

## 2018-03-09 DIAGNOSIS — Y999 Unspecified external cause status: Secondary | ICD-10-CM | POA: Insufficient documentation

## 2018-03-09 DIAGNOSIS — F0390 Unspecified dementia without behavioral disturbance: Secondary | ICD-10-CM | POA: Diagnosis not present

## 2018-03-09 DIAGNOSIS — S8992XA Unspecified injury of left lower leg, initial encounter: Secondary | ICD-10-CM | POA: Diagnosis present

## 2018-03-09 DIAGNOSIS — I5032 Chronic diastolic (congestive) heart failure: Secondary | ICD-10-CM | POA: Diagnosis not present

## 2018-03-09 DIAGNOSIS — I130 Hypertensive heart and chronic kidney disease with heart failure and stage 1 through stage 4 chronic kidney disease, or unspecified chronic kidney disease: Secondary | ICD-10-CM | POA: Insufficient documentation

## 2018-03-09 DIAGNOSIS — M2550 Pain in unspecified joint: Secondary | ICD-10-CM | POA: Diagnosis not present

## 2018-03-09 MED ORDER — LIDOCAINE HCL 2 % IJ SOLN
20.0000 mL | Freq: Once | INTRAMUSCULAR | Status: AC
  Administered 2018-03-09: 400 mg via INTRADERMAL
  Filled 2018-03-09: qty 20

## 2018-03-09 MED ORDER — ACETAMINOPHEN 325 MG PO TABS
650.0000 mg | ORAL_TABLET | Freq: Once | ORAL | Status: AC
  Administered 2018-03-09: 650 mg via ORAL
  Filled 2018-03-09: qty 2

## 2018-03-09 NOTE — ED Notes (Signed)
Phone report given to [place], Therapist, sports

## 2018-03-09 NOTE — ED Notes (Signed)
Bed: WA12 Expected date:  Expected time:  Means of arrival:  Comments: EMS 

## 2018-03-09 NOTE — ED Notes (Signed)
[facility] called for transportation  

## 2018-03-09 NOTE — ED Triage Notes (Signed)
Pt presents by [facility] for evaluation of right leg laceration after being transferred from bed to wheelchair while at [place]. Dressing

## 2018-03-09 NOTE — ED Provider Notes (Signed)
[facility] DEPT Provider Note   CSN: [id number] Arrival date & time: 03/09/18  0620     History   Chief Complaint Chief Complaint  Patient presents with  . Laceration    HPI Cindy Trevino is a 84 y.o. female.  HPI Patient presents to the emergency department with a laceration/skin tear to the right lower leg.  The patient was being transferred to the wheelchair when she hit her leg against the foot rest.  The family states that the staff told them that there was no other injury or fall.  Patient is unable to give any history due to dementia. Past Medical History:  Diagnosis Date  . Anemia of chronic disease 08/04/2014  . Anxiety 07/04/2015  . Arthritis   . Basal ganglia infarction ([place]) 04/17/2014  . Chronic diastolic CHF (congestive heart failure) ([place]) 12/02/2016  . CKD (chronic kidney disease) stage 3, GFR 30-59 ml/min ([place]) 08/04/2014  . COPD (chronic obstructive pulmonary disease) ([place])   . Dementia 02/02/2012  . Depression 04/23/2016  . DVT of lower extremity (deep venous thrombosis) ([place]) 01/23/2015  . Essential hypertension 04/20/2014  . History of breast cancer 2009   Left breast  s/p lumpectomy and rads. declined hormonal tx. last mammogram 2011  . Hyperlipidemia   . Hypertension   . OA (osteoarthritis) of knee 09/20/2014  . PAF (paroxysmal atrial fibrillation) ([place])   . Paroxysmal atrial fibrillation ([place]) 04/20/2014  . Pyelonephritis 12/09/2016  . SVT (supraventricular tachycardia) ([place])   . Vascular dementia with behavior disturbance 04/20/2014    Patient Active Problem List   Diagnosis Date Noted  . Dyslipidemia 10/23/2017  . Klebsiella pneumoniae (k. pneumoniae) as the cause of diseases classified elsewhere 12/09/2016  . Pyelonephritis 12/09/2016  . Chronic diastolic CHF (congestive heart failure) ([place]) 12/02/2016  . Protein calorie malnutrition ([place]) 12/02/2016  . Macular degeneration 09/06/2016  . Depression 04/23/2016  .  Poor appetite 10/11/2015  . Loss of weight 10/11/2015  . Diarrhea 09/10/2015  . Anxiety 07/04/2015  . Mood disorder ([place]) 07/04/2015  . DVT of lower extremity (deep venous thrombosis) ([place]) 01/23/2015  . OA (osteoarthritis) of knee 09/20/2014  . CKD (chronic kidney disease) stage 3, GFR 30-59 ml/min (HCC) 08/04/2014  . Anemia of chronic disease 08/04/2014  . Hyperlipidemia   . Essential hypertension 04/20/2014  . Vascular dementia with behavior disturbance 04/20/2014  . Paroxysmal atrial fibrillation ([place]) 04/20/2014  . Basal ganglia infarction ([place]) 04/17/2014  . History of breast cancer 08/23/2012    Past Surgical History:  Procedure Laterality Date  . BREAST LUMPECTOMY Left 2009   left breast  . CESAREAN SECTION    . EYE SURGERY     cataracts x3     OB History    Gravida  1   Para      Term      Preterm      AB  1   Living  2     SAB  1   TAB      Ectopic      Multiple      Live Births               Home Medications    Prior to Admission medications   Medication Sig Start Date End Date Taking? Authorizing Provider  acetaminophen (TYLENOL) 325 MG tablet Take 650 mg by mouth every 6 (six) hours as needed. Do not exceed 3000 mg in 24 hours   Yes [provider]  apixaban (  ELIQUIS) 2.5 MG TABS tablet Take 2.5 mg by mouth 2 (two) times daily.    [provider]  ferrous sulfate 325 (65 FE) MG tablet Take 325 mg by mouth daily. anemia    [provider]  furosemide (LASIX) 40 MG tablet Take 40 mg by mouth daily.    [provider]  LORazepam (ATIVAN) 0.5 MG tablet Take 0.5 mg by mouth 2 (two) times daily. Hold for sedation    [provider]  memantine (NAMENDA) 10 MG tablet Take 10 mg by mouth 2 (two) times daily.    [provider]  metoprolol tartrate (LOPRESSOR) 25 MG tablet Take 0.5 tablets (12.5 mg total) by mouth 3 (three) times daily. 12/07/16   Cherene Altes, MD  Multiple  Vitamins-Minerals (ICAPS AREDS 2) CHEW Chew 2 tablets by mouth daily.    [provider]  Nutritional Supplements (NUTRITIONAL SUPPLEMENT PO) Mechanical Soft with not theraputic restrictions.  Nectar Thick liquids.  Soda and tomato juice ok unthickened    [provider]  traZODone (DESYREL) 50 MG tablet Take 50 mg by mouth at bedtime.    [provider]    Family History Family History  Problem Relation Age of Onset  . Heart disease Mother   . Diabetes Mother   . Heart disease Father   . Diabetes Sister   . Hypertension Sister     Social History Social History   Tobacco Use  . Smoking status: Never Smoker  . Smokeless tobacco: Never Used  Substance Use Topics  . Alcohol use: No  . Drug use: No     Allergies   Patient has no known allergies.   Review of Systems Review of Systems Level 5 caveat applies due to dementia  Physical Exam Updated Vital Signs BP (!) 181/109 (BP Location: Left Arm)   Pulse 85   Temp (!) 97.3 F (36.3 C) (Oral)   Resp 14   Ht 5\' 6"  (1.676 m)   Wt 81.6 kg   LMP  (LMP Unknown)   SpO2 95%   BMI 29.04 kg/m   Physical Exam  Constitutional: She appears well-developed and well-nourished. No distress.  HENT:  Head: Normocephalic and atraumatic.  Eyes: Pupils are equal, round, and reactive to light.  Pulmonary/Chest: Effort normal.  Musculoskeletal:       Legs: Neurological: She is alert.  Skin: Skin is warm and dry.  Psychiatric: She has a normal mood and affect.  Nursing note and vitals reviewed.    ED Treatments / Results  Labs (all labs ordered are listed, but only abnormal results are displayed) Labs Reviewed - No data to display  EKG None  Radiology No results found.  Procedures Procedures (including critical care time)  Medications Ordered in ED Medications  lidocaine (XYLOCAINE) 2 % (with pres) injection 400 mg (400 mg Intradermal Given 03/09/18 0718)  acetaminophen (TYLENOL) tablet 650  mg (650 mg Oral Given 03/09/18 0747)     Initial Impression / Assessment and Plan / ED Course  I have reviewed the triage vital signs and the nursing notes.  Pertinent labs & imaging results that were available during my care of the patient were reviewed by me and considered in my medical decision making (see chart for details).     LACERATION REPAIR Performed by: Brent General Authorized by: Resa Miner Kensley Lares Consent: Verbal consent obtained. Risks and benefits: risks, benefits and alternatives were discussed Consent given by: patient Patient identity confirmed: provided demographic data Prepped  and Draped in normal sterile fashion Wound explored  Laceration Location: Right lower leg  Laceration Length: 5 cm  No Foreign Bodies seen or palpated  Anesthesia: local infiltration  Local anesthetic: lidocaine 2 % without epinephrine  Anesthetic total: 6 ml  Irrigation method: syringe Amount of cleaning: standard  Skin closure: Subcutaneous 4-0 Vicryl, Dermabond and Steri-Strips  Number of sutures: 3  Technique: Simple interrupted  Patient tolerance: Patient tolerated the procedure well with no immediate complications.    Patient had a skin tear that I closed some of the more open tissues subcutaneously and closed the top layers with Dermabond and Steri-Strips.  Family members given the plan and all questions were answered.  Final Clinical Impressions(s) / ED Diagnoses   Final diagnoses:  None    ED Discharge Orders    None       Dalia Heading, PA-C 03/09/18 0910    Jola Schmidt, MD 03/09/18 [phone]

## 2018-03-09 NOTE — Discharge Instructions (Addendum)
Tylenol as needed for any pain.  The Steri-Strips and Dermabond will come off on their own.  Do not scrub or get the area wet.  Change the dressing 3 times a day.

## 2018-03-11 ENCOUNTER — Encounter: Payer: Self-pay | Admitting: Internal Medicine

## 2018-03-14 ENCOUNTER — Encounter: Payer: Self-pay | Admitting: Internal Medicine

## 2018-03-14 NOTE — Assessment & Plan Note (Signed)
No declines noted; continue PreserVision AREDS twice daily

## 2018-03-14 NOTE — Assessment & Plan Note (Signed)
Exacerbations in a while; continue metoprolol 12.5 mg twice daily and Lasix 40 mg daily

## 2018-03-14 NOTE — Assessment & Plan Note (Signed)
No reports of pain; continue Tylenol 650 mg twice daily

## 2018-03-23 DIAGNOSIS — G4700 Insomnia, unspecified: Secondary | ICD-10-CM | POA: Diagnosis not present

## 2018-03-23 DIAGNOSIS — F419 Anxiety disorder, unspecified: Secondary | ICD-10-CM | POA: Diagnosis not present

## 2018-03-23 DIAGNOSIS — F0390 Unspecified dementia without behavioral disturbance: Secondary | ICD-10-CM | POA: Diagnosis not present

## 2018-04-02 ENCOUNTER — Non-Acute Institutional Stay (SKILLED_NURSING_FACILITY): Payer: Medicare Other | Admitting: Internal Medicine

## 2018-04-02 ENCOUNTER — Encounter: Payer: Self-pay | Admitting: Internal Medicine

## 2018-04-02 DIAGNOSIS — D638 Anemia in other chronic diseases classified elsewhere: Secondary | ICD-10-CM

## 2018-04-02 DIAGNOSIS — E7849 Other hyperlipidemia: Secondary | ICD-10-CM | POA: Diagnosis not present

## 2018-04-02 DIAGNOSIS — N183 Chronic kidney disease, stage 3 unspecified: Secondary | ICD-10-CM

## 2018-04-02 NOTE — Progress Notes (Signed)
Location:  [facility] Room Number: 217D Place of Service:  SNF [phone])  Noah Delaine. Sheppard Coil, MD  Patient Care Team: Hennie Duos, MD as PCP - General (Internal Medicine)  Extended Emergency Contact Information Primary Emergency Contact: [facility] Address: [place], [place] [address] [name] of [place] Phone: [phone] Relation: Grandson    Allergies: Patient has no known allergies.  Chief Complaint  Patient presents with  . Medical Management of Chronic Issues    Routine Visit  . Health Maintenance    Influenza vacc.    HPI: Patient is 84 y.o. female who is being seen for routine issues of hyperlipidemia, anemia of chronic disease, and chronic kidney disease stage III.  Past Medical History:  Diagnosis Date  . Anemia of chronic disease 08/04/2014  . Anxiety 07/04/2015  . Arthritis   . Basal ganglia infarction ([place]) 04/17/2014  . Chronic diastolic CHF (congestive heart failure) ([employer]) 12/02/2016  . CKD (chronic kidney disease) stage 3, GFR 30-59 ml/min ([place]) 08/04/2014  . COPD (chronic obstructive pulmonary disease) ([place])   . Dementia ([place]) 02/02/2012  . Depression 04/23/2016  . DVT of lower extremity (deep venous thrombosis) ([place]) 01/23/2015  . Essential hypertension 04/20/2014  . History of breast cancer 2009   Left breast  s/p lumpectomy and rads. declined hormonal tx. last mammogram 2011  . Hyperlipidemia   . Hypertension   . OA (osteoarthritis) of knee 09/20/2014  . PAF (paroxysmal atrial fibrillation) ([place])   . Paroxysmal atrial fibrillation ([place]) 04/20/2014  . Pyelonephritis 12/09/2016  . SVT (supraventricular tachycardia) ([place])   . Vascular dementia with behavior disturbance ([place]) 04/20/2014    Past Surgical History:  Procedure Laterality Date  . BREAST LUMPECTOMY Left 2009   left breast  . CESAREAN SECTION    . EYE SURGERY     cataracts x3    Allergies as of 04/02/2018   No Known Allergies       Medication List        Accurate as of 04/02/18 11:59 PM. Always use your most recent med list.          acetaminophen 325 MG tablet Commonly known as:  TYLENOL Take 650 mg by mouth every 6 (six) hours as needed for moderate pain. Do not exceed 3000 mg in 24 hours   acetaminophen 650 MG CR tablet Commonly known as:  TYLENOL Take 650 mg by mouth 2 (two) times daily.   atorvastatin 20 MG tablet Commonly known as:  LIPITOR Take 20 mg by mouth daily.   ELIQUIS 2.5 MG Tabs tablet Generic drug:  apixaban Take 2.5 mg by mouth 2 (two) times daily.   ferrous sulfate 325 (65 FE) MG tablet Take 325 mg by mouth daily with breakfast.   furosemide 40 MG tablet Commonly known as:  LASIX Take 40 mg by mouth daily.   LORazepam 0.5 MG tablet Commonly known as:  ATIVAN Take 0.5 mg by mouth 2 (two) times daily.   memantine 10 MG tablet Commonly known as:  NAMENDA Take 10 mg by mouth 2 (two) times daily.   metoprolol tartrate 25 MG tablet Commonly known as:  LOPRESSOR Take 0.5 tablets (12.5 mg total) by mouth 3 (three) times daily.   PRESERVISION AREDS 2 Chew Chew 2 tablets by mouth daily.   REFRESH OPTIVE ADVANCED OP Apply 1 drop to eye 2 (two) times daily. Apply to both eyes bid   senna  8.6 MG tablet Commonly known as:  SENOKOT Take 1 tablet by mouth daily.   traZODone 50 MG tablet Commonly known as:  DESYREL Take 50 mg by mouth at bedtime.       No orders of the defined types were placed in this encounter.   Immunization History  Administered Date(s) Administered  . Influenza Inj Mdck Quad Pf 03/09/2012  . Influenza Split 04/20/2009, 05/12/2010  . Influenza, Seasonal, Injecte, Preservative Fre 03/21/2013  . Influenza-Unspecified 03/22/2015  . PPD Test 05/08/2014  . Pneumococcal Polysaccharide-23 05/12/2010  . Td 01/14/2008  . Zoster 01/14/2008    Social History   Tobacco Use  . Smoking status: Never Smoker  . Smokeless tobacco: Never Used  Substance Use  Topics  . Alcohol use: No    Review of Systems   unable to obtain secondary to dementia; nursing-no acute concerns    Vitals:   04/02/18 1336  BP: 140/75  Pulse: 78  Resp: 16  Temp: (!) 97.2 F (36.2 C)   Body mass index is 31.46 kg/m. Physical Exam  GENERAL APPEARANCE: Alert, conversant, No acute distress  SKIN: No diaphoresis rash HEENT: Unremarkable RESPIRATORY: Breathing is even, unlabored. Lung sounds are clear   CARDIOVASCULAR: Heart RRR no murmurs, rubs or gallops. No peripheral edema  GASTROINTESTINAL: Abdomen is soft, non-tender, not distended w/ normal bowel sounds.  GENITOURINARY: Bladder non tender, not distended  MUSCULOSKELETAL: No abnormal joints or musculature NEUROLOGIC: Cranial nerves 2-12 grossly intact. Moves all extremities PSYCHIATRIC: Dementia, some behavioral issues  Patient Active Problem List   Diagnosis Date Noted  . Dyslipidemia 10/23/2017  . Klebsiella pneumoniae (k. pneumoniae) as the cause of diseases classified elsewhere 12/09/2016  . Pyelonephritis 12/09/2016  . Chronic diastolic CHF (congestive heart failure) ([place]) 12/02/2016  . Protein calorie malnutrition ([place]) 12/02/2016  . Macular degeneration 09/06/2016  . Depression 04/23/2016  . Poor appetite 10/11/2015  . Loss of weight 10/11/2015  . Diarrhea 09/10/2015  . Anxiety 07/04/2015  . Mood disorder ([place]) 07/04/2015  . DVT of lower extremity (deep venous thrombosis) ([place]) 01/23/2015  . OA (osteoarthritis) of knee 09/20/2014  . CKD (chronic kidney disease) stage 3, GFR 30-59 ml/min (HCC) 08/04/2014  . Anemia of chronic disease 08/04/2014  . Hyperlipidemia   . Essential hypertension 04/20/2014  . Vascular dementia with behavior disturbance ([place]) 04/20/2014  . Paroxysmal atrial fibrillation ([place]) 04/20/2014  . Basal ganglia infarction ([place]) 04/17/2014  . History of breast cancer 08/23/2012    CMP     Component Value Date/Time   NA 141 08/03/2017   K 4.3 08/03/2017   CL 105  12/06/2016 0346   CO2 20 (L) 12/06/2016 0346   GLUCOSE 85 12/06/2016 0346   BUN 32 (A) 08/03/2017   CREATININE 1.1 08/03/2017   CREATININE 1.43 (H) 12/06/2016 0346   CALCIUM 7.6 (L) 12/06/2016 0346   PROT 6.3 (L) 12/02/2016 0556   ALBUMIN 2.3 (L) 12/02/2016 0556   AST 12 (A) 07/23/2017   ALT 9 07/23/2017   ALKPHOS 73 07/23/2017   BILITOT 0.9 12/02/2016 0556   GFRNONAA 31 (L) 12/06/2016 0346   GFRAA 36 (L) 12/06/2016 0346   Recent Labs    07/23/17 08/03/17  NA 143 141  K 4.6 4.3  BUN 34* 32*  CREATININE 1.1 1.1   Recent Labs    07/23/17  AST 12*  ALT 9  ALKPHOS 73   Recent Labs    07/23/17  WBC 8.2  HGB 11.4*  HCT 36  PLT 200  Recent Labs    05/26/17 07/23/17  CHOL 97 110  LDLCALC 50 60  TRIG 73 68   No results found for: [facility] Lab Results  Component Value Date   TSH 4.22 07/23/2017   Lab Results  Component Value Date   HGBA1C 5.6 07/23/2017   Lab Results  Component Value Date   CHOL 110 07/23/2017   HDL 37 07/23/2017   LDLCALC 60 07/23/2017   TRIG 68 07/23/2017   CHOLHDL 5.7 04/18/2014    Significant Diagnostic Results in last 30 days:  No results found.  Assessment and Plan  Hyperlipidemia LDL is 60, good control on Lipitor 20 mg daily; continue current regimen  Anemia of chronic disease Hemoglobin 1.4; continue iron 325 mg p.o. daily  CKD (chronic kidney disease) stage 3, GFR 30-59 ml/min Creatinine 1.1 which is stable from prior; will monitor at intervals    Webb Silversmith D. Sheppard Coil, MD

## 2018-04-08 DIAGNOSIS — Z23 Encounter for immunization: Secondary | ICD-10-CM | POA: Diagnosis not present

## 2018-04-16 DIAGNOSIS — L603 Nail dystrophy: Secondary | ICD-10-CM | POA: Diagnosis not present

## 2018-04-16 DIAGNOSIS — I739 Peripheral vascular disease, unspecified: Secondary | ICD-10-CM | POA: Diagnosis not present

## 2018-04-16 DIAGNOSIS — R600 Localized edema: Secondary | ICD-10-CM | POA: Diagnosis not present

## 2018-04-16 DIAGNOSIS — B351 Tinea unguium: Secondary | ICD-10-CM | POA: Diagnosis not present

## 2018-04-29 ENCOUNTER — Encounter: Payer: Self-pay | Admitting: Internal Medicine

## 2018-04-29 DIAGNOSIS — F39 Unspecified mood [affective] disorder: Secondary | ICD-10-CM | POA: Diagnosis not present

## 2018-04-29 DIAGNOSIS — F0390 Unspecified dementia without behavioral disturbance: Secondary | ICD-10-CM | POA: Diagnosis not present

## 2018-04-29 DIAGNOSIS — F419 Anxiety disorder, unspecified: Secondary | ICD-10-CM | POA: Diagnosis not present

## 2018-04-29 DIAGNOSIS — G4700 Insomnia, unspecified: Secondary | ICD-10-CM | POA: Diagnosis not present

## 2018-04-29 NOTE — Assessment & Plan Note (Signed)
Hemoglobin 1.4; continue iron 325 mg p.o. daily

## 2018-04-29 NOTE — Assessment & Plan Note (Signed)
Creatinine 1.1 which is stable from prior; will monitor at intervals

## 2018-04-29 NOTE — Assessment & Plan Note (Signed)
LDL is 60, good control on Lipitor 20 mg daily; continue current regimen

## 2018-05-05 ENCOUNTER — Non-Acute Institutional Stay (SKILLED_NURSING_FACILITY): Payer: Medicare Other | Admitting: Internal Medicine

## 2018-05-05 ENCOUNTER — Encounter: Payer: Self-pay | Admitting: Internal Medicine

## 2018-05-05 DIAGNOSIS — I10 Essential (primary) hypertension: Secondary | ICD-10-CM

## 2018-05-05 DIAGNOSIS — I480 Paroxysmal atrial fibrillation: Secondary | ICD-10-CM

## 2018-05-05 DIAGNOSIS — F01518 Vascular dementia, unspecified severity, with other behavioral disturbance: Secondary | ICD-10-CM

## 2018-05-05 DIAGNOSIS — F0151 Vascular dementia with behavioral disturbance: Secondary | ICD-10-CM

## 2018-05-05 DIAGNOSIS — I69354 Hemiplegia and hemiparesis following cerebral infarction affecting left non-dominant side: Secondary | ICD-10-CM | POA: Diagnosis not present

## 2018-05-05 DIAGNOSIS — M6281 Muscle weakness (generalized): Secondary | ICD-10-CM | POA: Diagnosis not present

## 2018-05-05 DIAGNOSIS — M545 Low back pain: Secondary | ICD-10-CM | POA: Diagnosis not present

## 2018-05-05 NOTE — Progress Notes (Signed)
Location:  [facility] Room Number: 217D Place of Service:  SNF [phone])  Cindy Trevino. Cindy Coil, MD  Patient Care Team: Hennie Duos, MD as PCP - General (Internal Medicine)  Extended Emergency Contact Information Primary Emergency Contact: [facility] Address: [place], [place] [address] [name] of [place] Phone: [phone] Relation: Grandson    Allergies: Patient has no known allergies.  Chief Complaint  Patient presents with  . Medical Management of Chronic Issues    Routine Visit  . Health Maintenance    Influenza vacc.    HPI: Patient is 84 y.o. female who is being seen for routine issues of hypertension, dementia, and paroxysmal atrial fib.  Past Medical History:  Diagnosis Date  . Anemia of chronic disease 08/04/2014  . Anxiety 07/04/2015  . Arthritis   . Basal ganglia infarction ([place]) 04/17/2014  . Chronic diastolic CHF (congestive heart failure) ([place]) 12/02/2016  . CKD (chronic kidney disease) stage 3, GFR 30-59 ml/min ([place]) 08/04/2014  . COPD (chronic obstructive pulmonary disease) ([place])   . Dementia ([place]) 02/02/2012  . Depression 04/23/2016  . DVT of lower extremity (deep venous thrombosis) ([place]) 01/23/2015  . Essential hypertension 04/20/2014  . History of breast cancer 2009   Left breast  s/p lumpectomy and rads. declined hormonal tx. last mammogram 2011  . Hyperlipidemia   . Hypertension   . OA (osteoarthritis) of knee 09/20/2014  . PAF (paroxysmal atrial fibrillation) ([place])   . Paroxysmal atrial fibrillation ([place]) 04/20/2014  . Pyelonephritis 12/09/2016  . SVT (supraventricular tachycardia) ([place])   . Vascular dementia with behavior disturbance ([place]) 04/20/2014    Past Surgical History:  Procedure Laterality Date  . BREAST LUMPECTOMY Left 2009   left breast  . CESAREAN SECTION    . EYE SURGERY     cataracts x3    Allergies as of 05/05/2018   No Known Allergies     Medication List        Accurate as of 05/05/18 11:59 PM. Always use your most recent med list.          acetaminophen 325 MG tablet Commonly known as:  TYLENOL Take 650 mg by mouth every 6 (six) hours as needed for moderate pain. Do not exceed 3000 mg in 24 hours   acetaminophen 650 MG CR tablet Commonly known as:  TYLENOL Take 650 mg by mouth 2 (two) times daily.   atorvastatin 20 MG tablet Commonly known as:  LIPITOR Take 20 mg by mouth daily.   ELIQUIS 2.5 MG Tabs tablet Generic drug:  apixaban Take 2.5 mg by mouth 2 (two) times daily.   ferrous sulfate 325 (65 FE) MG tablet Take 325 mg by mouth daily with breakfast.   furosemide 40 MG tablet Commonly known as:  LASIX Take 40 mg by mouth daily.   LORazepam 0.5 MG tablet Commonly known as:  ATIVAN Take 0.5 mg by mouth 2 (two) times daily.   memantine 10 MG tablet Commonly known as:  NAMENDA Take 10 mg by mouth 2 (two) times daily.   metoprolol tartrate 25 MG tablet Commonly known as:  LOPRESSOR Take 0.5 tablets (12.5 mg total) by mouth 3 (three) times daily.   mirtazapine 15 MG tablet Commonly known as:  REMERON Take 7.5 mg by mouth at bedtime.   PRESERVISION AREDS 2 Chew Chew 2 tablets by mouth daily.   REFRESH OPTIVE ADVANCED OP Apply 1 drop to eye 2 (  two) times daily. Apply to both eyes bid   senna 8.6 MG tablet Commonly known as:  SENOKOT Take 1 tablet by mouth daily.   traZODone 50 MG tablet Commonly known as:  DESYREL Take 50 mg by mouth at bedtime.       No orders of the defined types were placed in this encounter.   Immunization History  Administered Date(s) Administered  . Influenza Inj Mdck Quad Pf 03/09/2012  . Influenza Split 04/20/2009, 05/12/2010  . Influenza, Seasonal, Injecte, Preservative Fre 03/21/2013  . Influenza-Unspecified 03/22/2015  . PPD Test 05/08/2014  . Pneumococcal Polysaccharide-23 05/12/2010  . Td 01/14/2008  . Zoster 01/14/2008    Social History   Tobacco Use  . Smoking status:  Never Smoker  . Smokeless tobacco: Never Used  Substance Use Topics  . Alcohol use: No    Review of Systems  DATA OBTAINED: from patient-extremely limited; nursing-no acute concerns GENERAL:  no fevers, fatigue, appetite changes SKIN: No itching, rash HEENT: No complaint RESPIRATORY: No cough, wheezing, SOB CARDIAC: No chest pain, palpitations, lower extremity edema  GI: No abdominal pain, No N/V/D or constipation, No heartburn or reflux  GU: No dysuria, frequency or urgency, or incontinence  MUSCULOSKELETAL: No unrelieved bone/joint pain NEUROLOGIC: No headache, dizziness  PSYCHIATRIC: No overt anxiety or sadness  Vitals:   05/05/18 1059  BP: 107/75  Pulse: 91  Resp: 16  Temp: (!) 97.3 F (36.3 C)   Body mass index is 31.35 kg/m. Physical Exam  GENERAL APPEARANCE: Alert, conversant, No acute distress  SKIN: No diaphoresis rash HEENT: Unremarkable RESPIRATORY: Breathing is even, unlabored. Lung sounds are clear   CARDIOVASCULAR: Heart RRR no murmurs, rubs or gallops.  His peripheral edema  GASTROINTESTINAL: Abdomen is soft, non-tender, not distended w/ normal bowel sounds.  GENITOURINARY: Bladder non tender, not distended  MUSCULOSKELETAL: No abnormal joints or musculature NEUROLOGIC: Cranial nerves 2-12 grossly intact. Moves all extremities PSYCHIATRIC: Dementia, occasional mild behavioral issues  Patient Active Problem List   Diagnosis Date Noted  . Dyslipidemia 10/23/2017  . Klebsiella pneumoniae (k. pneumoniae) as the cause of diseases classified elsewhere 12/09/2016  . Pyelonephritis 12/09/2016  . Chronic diastolic CHF (congestive heart failure) ([place]) 12/02/2016  . Protein calorie malnutrition ([place]) 12/02/2016  . Macular degeneration 09/06/2016  . Depression 04/23/2016  . Poor appetite 10/11/2015  . Loss of weight 10/11/2015  . Diarrhea 09/10/2015  . Anxiety 07/04/2015  . Mood disorder ([place]) 07/04/2015  . DVT of lower extremity (deep venous thrombosis)  ([place]) 01/23/2015  . OA (osteoarthritis) of knee 09/20/2014  . CKD (chronic kidney disease) stage 3, GFR 30-59 ml/min (HCC) 08/04/2014  . Anemia of chronic disease 08/04/2014  . Hyperlipidemia   . Essential hypertension 04/20/2014  . Vascular dementia with behavior disturbance ([place]) 04/20/2014  . Paroxysmal atrial fibrillation ([place]) 04/20/2014  . Basal ganglia infarction ([place]) 04/17/2014  . History of breast cancer 08/23/2012    CMP     Component Value Date/Time   NA 141 08/03/2017   K 4.3 08/03/2017   CL 105 12/06/2016 0346   CO2 20 (L) 12/06/2016 0346   GLUCOSE 85 12/06/2016 0346   BUN 32 (A) 08/03/2017   CREATININE 1.1 08/03/2017   CREATININE 1.43 (H) 12/06/2016 0346   CALCIUM 7.6 (L) 12/06/2016 0346   PROT 6.3 (L) 12/02/2016 0556   ALBUMIN 2.3 (L) 12/02/2016 0556   AST 12 (A) 07/23/2017   ALT 9 07/23/2017   ALKPHOS 73 07/23/2017   BILITOT 0.9 12/02/2016 0556   GFRNONAA  31 (L) 12/06/2016 0346   GFRAA 36 (L) 12/06/2016 0346   Recent Labs    07/23/17 08/03/17  NA 143 141  K 4.6 4.3  BUN 34* 32*  CREATININE 1.1 1.1   Recent Labs    07/23/17  AST 12*  ALT 9  ALKPHOS 73   Recent Labs    07/23/17  WBC 8.2  HGB 11.4*  HCT 36  PLT 200   Recent Labs    05/26/17 07/23/17  CHOL 97 110  LDLCALC 50 60  TRIG 73 68   No results found for: [facility] Lab Results  Component Value Date   TSH 4.22 07/23/2017   Lab Results  Component Value Date   HGBA1C 5.6 07/23/2017   Lab Results  Component Value Date   CHOL 110 07/23/2017   HDL 37 07/23/2017   LDLCALC 60 07/23/2017   TRIG 68 07/23/2017   CHOLHDL 5.7 04/18/2014    Significant Diagnostic Results in last 30 days:  No results found.  Assessment and Plan  Essential hypertension Controlled; continue Lasix 40 mg daily and metoprolol 12.5 mg 3 times daily  Vascular dementia with behavior disturbance Chronic and severe; continue Namenda 10 mg twice daily  Paroxysmal atrial fibrillation (HCC) Rate  controlled; continue metoprolol 12.5 mg 3 times daily and Eliquis 2.5 mg twice daily as prophylaxis     Webb Silversmith D. Cindy Coil, MD

## 2018-05-08 ENCOUNTER — Encounter: Payer: Self-pay | Admitting: Internal Medicine

## 2018-05-08 NOTE — Assessment & Plan Note (Signed)
Rate controlled; continue metoprolol 12.5 mg 3 times daily and Eliquis 2.5 mg twice daily as prophylaxis

## 2018-05-08 NOTE — Assessment & Plan Note (Signed)
Chronic and severe; continue Namenda 10 mg twice daily

## 2018-05-08 NOTE — Assessment & Plan Note (Signed)
Controlled; continue Lasix 40 mg daily and metoprolol 12.5 mg 3 times daily

## 2018-05-10 DIAGNOSIS — E119 Type 2 diabetes mellitus without complications: Secondary | ICD-10-CM | POA: Diagnosis not present

## 2018-05-10 DIAGNOSIS — E039 Hypothyroidism, unspecified: Secondary | ICD-10-CM | POA: Diagnosis not present

## 2018-05-10 DIAGNOSIS — E559 Vitamin D deficiency, unspecified: Secondary | ICD-10-CM | POA: Diagnosis not present

## 2018-05-10 DIAGNOSIS — I10 Essential (primary) hypertension: Secondary | ICD-10-CM | POA: Diagnosis not present

## 2018-05-10 DIAGNOSIS — D649 Anemia, unspecified: Secondary | ICD-10-CM | POA: Diagnosis not present

## 2018-05-10 DIAGNOSIS — M545 Low back pain: Secondary | ICD-10-CM | POA: Diagnosis not present

## 2018-05-10 DIAGNOSIS — I69354 Hemiplegia and hemiparesis following cerebral infarction affecting left non-dominant side: Secondary | ICD-10-CM | POA: Diagnosis not present

## 2018-05-10 DIAGNOSIS — M6281 Muscle weakness (generalized): Secondary | ICD-10-CM | POA: Diagnosis not present

## 2018-05-10 DIAGNOSIS — E785 Hyperlipidemia, unspecified: Secondary | ICD-10-CM | POA: Diagnosis not present

## 2018-05-10 LAB — LIPID PANEL
CHOLESTEROL: 70 (ref 0–200)
HDL: 16 — AB (ref 35–70)
LDL Cholesterol: 36
TRIGLYCERIDES: 91 (ref 40–160)

## 2018-05-10 LAB — CBC AND DIFFERENTIAL
HEMATOCRIT: 34 — AB (ref 36–46)
Hemoglobin: 10.6 — AB (ref 12.0–16.0)
Platelets: 349 (ref 150–399)
WBC: 13.1

## 2018-05-10 LAB — HEMOGLOBIN A1C: Hemoglobin A1C: 6.4

## 2018-05-10 LAB — HEPATIC FUNCTION PANEL
ALT: 7 (ref 7–35)
AST: 11 — AB (ref 13–35)
Alkaline Phosphatase: 81 (ref 25–125)
Bilirubin, Total: 0.6

## 2018-05-10 LAB — TSH: TSH: 1.82 (ref 0.41–5.90)

## 2018-05-10 LAB — VITAMIN D 25 HYDROXY (VIT D DEFICIENCY, FRACTURES): Vit D, 25-Hydroxy: 34.5

## 2018-05-10 LAB — BASIC METABOLIC PANEL
BUN: 47 — AB (ref 4–21)
CREATININE: 1.6 — AB (ref 0.5–1.1)
Glucose: 93
POTASSIUM: 4.5 (ref 3.4–5.3)
SODIUM: 142 (ref 137–147)

## 2018-05-11 ENCOUNTER — Encounter: Payer: Self-pay | Admitting: Internal Medicine

## 2018-05-11 DIAGNOSIS — M545 Low back pain: Secondary | ICD-10-CM | POA: Diagnosis not present

## 2018-05-11 DIAGNOSIS — M6281 Muscle weakness (generalized): Secondary | ICD-10-CM | POA: Diagnosis not present

## 2018-05-11 DIAGNOSIS — R627 Adult failure to thrive: Secondary | ICD-10-CM | POA: Insufficient documentation

## 2018-05-11 DIAGNOSIS — I69354 Hemiplegia and hemiparesis following cerebral infarction affecting left non-dominant side: Secondary | ICD-10-CM | POA: Diagnosis not present

## 2018-05-12 DIAGNOSIS — M6281 Muscle weakness (generalized): Secondary | ICD-10-CM | POA: Diagnosis not present

## 2018-05-12 DIAGNOSIS — M545 Low back pain: Secondary | ICD-10-CM | POA: Diagnosis not present

## 2018-05-12 DIAGNOSIS — I69354 Hemiplegia and hemiparesis following cerebral infarction affecting left non-dominant side: Secondary | ICD-10-CM | POA: Diagnosis not present

## 2018-05-13 ENCOUNTER — Non-Acute Institutional Stay: Payer: Self-pay | Admitting: Internal Medicine

## 2018-05-13 DIAGNOSIS — R413 Other amnesia: Secondary | ICD-10-CM

## 2018-05-13 DIAGNOSIS — R531 Weakness: Secondary | ICD-10-CM

## 2018-05-13 DIAGNOSIS — E46 Unspecified protein-calorie malnutrition: Secondary | ICD-10-CM

## 2018-05-13 DIAGNOSIS — Z515 Encounter for palliative care: Secondary | ICD-10-CM

## 2018-05-16 ENCOUNTER — Telehealth: Payer: Self-pay | Admitting: Internal Medicine

## 2018-05-16 NOTE — Progress Notes (Signed)
[facility] Telephone: [phone] Fax: [phone]  PATIENT NAME: Cindy Trevino DOB: 12-01-1924 MRN: [id number]  PRIMARY CARE PROVIDER:   Hennie Duos, MD  REFERRING PROVIDER:  Hennie Duos, MD [place], [place] [address]  RESPONSIBLE PARTYDezirae Service) [phone]    RECOMMENDATIONS and PLAN:  1.  Generalized weakness R53.1:  Chronic with progressive decline.  Nearing end of life with current trajectory.  2.  Memory Loss R41.3:  FAST stage 7C.  End stage dementia  3  Protein calorie malnutrition E46:   Not life sustaining if no improvement. Continue to offer nutritional intake and hydration.   4.  Palliative care encounter Z51.5:  Plan on discussion with PCP and grandson for additional planning.  Pt is appropriate for transition to Hospice services due to cognitive and functional decline as well as refusal for nutritional intake.     I spent 20 minutes providing this consultation,  from 0930 to 0950 at [facility]. More than 50% of the time in this consultation was spent coordinating communication with clinical staff and SW.   HISTORY OF PRESENT ILLNESS:  Cindy Trevino is a 84 y.o. year old female with multiple medical problems including dementia, dysphagia, s/p CVA and afib.  Staff reports that pt has been refusing nutrition and hydration for aprox 3 weeks.  She will occasionally sip Medpass protein supplement. Reports of an unstageable sacral wound.  She is total care, incontinent of B&B, bedbound and non-verbal. She is not able to make decisions for self.  Palliative Care was asked to help address goals of care and advanced care planning.   CODE STATUS: DNR   PPS: 20%  Occasional sips HOSPICE ELIGIBILITY/DIAGNOSIS: TBD  PAST MEDICAL HISTORY:  Past Medical History:  Diagnosis Date  . Anemia of chronic disease 08/04/2014  . Anxiety 07/04/2015  . Arthritis   . Basal ganglia infarction ([place]) 04/17/2014  . Chronic  diastolic CHF (congestive heart failure) ([place]) 12/02/2016  . CKD (chronic kidney disease) stage 3, GFR 30-59 ml/min ([place]) 08/04/2014  . COPD (chronic obstructive pulmonary disease) ([place])   . Dementia ([place]) 02/02/2012  . Depression 04/23/2016  . DVT of lower extremity (deep venous thrombosis) ([place]) 01/23/2015  . Essential hypertension 04/20/2014  . History of breast cancer 2009   Left breast  s/p lumpectomy and rads. declined hormonal tx. last mammogram 2011  . Hyperlipidemia   . Hypertension   . OA (osteoarthritis) of knee 09/20/2014  . PAF (paroxysmal atrial fibrillation) ([place])   . Paroxysmal atrial fibrillation ([place]) 04/20/2014  . Pyelonephritis 12/09/2016  . SVT (supraventricular tachycardia) ([place])   . Vascular dementia with behavior disturbance ([place]) 04/20/2014    SOCIAL HX:  Social History   Tobacco Use  . Smoking status: Never Smoker  . Smokeless tobacco: Never Used  Substance Use Topics  . Alcohol use: No    ALLERGIES: No Known Allergies   PERTINENT MEDICATIONS:  Outpatient Encounter Medications as of 05/13/2018  Medication Sig  . acetaminophen (TYLENOL) 325 MG tablet Take 650 mg by mouth every 6 (six) hours as needed for moderate pain. Do not exceed 3000 mg in 24 hours   . acetaminophen (TYLENOL) 650 MG CR tablet Take 650 mg by mouth 2 (two) times daily.  Marland Kitchen apixaban (ELIQUIS) 2.5 MG TABS tablet Take 2.5 mg by mouth 2 (two) times daily.  Marland Kitchen atorvastatin (LIPITOR) 20 MG tablet Take 20 mg by mouth daily.  . Carboxymeth-Glycerin-Polysorb (REFRESH OPTIVE ADVANCED OP) Apply  1 drop to eye 2 (two) times daily. Apply to both eyes bid  . ferrous sulfate 325 (65 FE) MG tablet Take 325 mg by mouth daily with breakfast.  . furosemide (LASIX) 40 MG tablet Take 40 mg by mouth daily.  Marland Kitchen LORazepam (ATIVAN) 0.5 MG tablet Take 0.5 mg by mouth 2 (two) times daily.  . memantine (NAMENDA) 10 MG tablet Take 10 mg by mouth 2 (two) times daily.  . metoprolol tartrate (LOPRESSOR) 25 MG tablet Take 0.5  tablets (12.5 mg total) by mouth 3 (three) times daily.  . mirtazapine (REMERON) 15 MG tablet Take 7.5 mg by mouth at bedtime.  . Multiple Vitamins-Minerals (PRESERVISION AREDS 2) CHEW Chew 2 tablets by mouth daily.  Marland Kitchen senna (SENOKOT) 8.6 MG tablet Take 1 tablet by mouth daily.  . traZODone (DESYREL) 50 MG tablet Take 50 mg by mouth at bedtime.   No facility-administered encounter medications on file as of 05/13/2018.     PHYSICAL EXAM:   General: Chronically ill and frail appearing elderly female reclining in bed.  In NAD ENT:  Very dry mucus membranes Cardiovascular: regular rate and rhythm Pulmonary: clear ant fields Abdomen: soft, nontender, + bowel sounds GU: no suprapubic tenderness Extremities: no edema, no joint deformities Skin: Sacral wound not visualized.  Poor skin turgor Neurological:  Somnolent but arouses to name.  Non-verbal, does not follow commands. Psych:  Smiling.  Cooperative  Gonzella Lex, NP-C

## 2018-05-16 NOTE — Telephone Encounter (Signed)
Phoned Dr. Sheppard Coil to discuss pt Cindy Trevino and recommendation for Hospice care.  Dr. Sheppard Coil informed me that patient expired in her sleep since my visit on 05/13/18.  Records will be updated.

## 2018-05-30 DEATH — deceased
